# Patient Record
Sex: Female | Born: 1985 | Race: White | Hispanic: Yes | Marital: Married | Smoking: Never smoker
Health system: Southern US, Community
[De-identification: ages and names within clinical notes are randomized; demographics above are authoritative.]

## PROBLEM LIST (undated history)

## (undated) DIAGNOSIS — E039 Hypothyroidism, unspecified: Secondary | ICD-10-CM

## (undated) DIAGNOSIS — F32A Depression, unspecified: Secondary | ICD-10-CM

## (undated) DIAGNOSIS — G43909 Migraine, unspecified, not intractable, without status migrainosus: Secondary | ICD-10-CM

## (undated) DIAGNOSIS — F329 Major depressive disorder, single episode, unspecified: Secondary | ICD-10-CM

## (undated) HISTORY — DX: Migraine, unspecified, not intractable, without status migrainosus: G43.909

## (undated) HISTORY — PX: MOUTH SURGERY: SHX715

## (undated) HISTORY — DX: Hypothyroidism, unspecified: E03.9

## (undated) HISTORY — DX: Depression, unspecified: F32.A

## (undated) HISTORY — DX: Major depressive disorder, single episode, unspecified: F32.9

---

## 2007-07-11 ENCOUNTER — Emergency Department (HOSPITAL_COMMUNITY): Admission: EM | Admit: 2007-07-11 | Discharge: 2007-07-11 | Payer: Self-pay | Admitting: Emergency Medicine

## 2008-01-22 ENCOUNTER — Emergency Department (HOSPITAL_COMMUNITY): Admission: EM | Admit: 2008-01-22 | Discharge: 2008-01-23 | Payer: Self-pay | Admitting: Emergency Medicine

## 2009-04-16 ENCOUNTER — Emergency Department (HOSPITAL_COMMUNITY): Admission: EM | Admit: 2009-04-16 | Discharge: 2009-04-16 | Payer: Self-pay | Admitting: Family Medicine

## 2009-06-22 DIAGNOSIS — Z8759 Personal history of other complications of pregnancy, childbirth and the puerperium: Secondary | ICD-10-CM | POA: Insufficient documentation

## 2009-06-22 LAB — HM PAP SMEAR: HM Pap smear: NORMAL

## 2009-09-20 HISTORY — PX: OTHER SURGICAL HISTORY: SHX169

## 2009-09-28 ENCOUNTER — Emergency Department (HOSPITAL_COMMUNITY): Admission: EM | Admit: 2009-09-28 | Discharge: 2009-09-29 | Payer: Self-pay | Admitting: Emergency Medicine

## 2009-09-29 ENCOUNTER — Inpatient Hospital Stay (HOSPITAL_COMMUNITY): Admission: AD | Admit: 2009-09-29 | Discharge: 2009-09-29 | Payer: Self-pay | Admitting: Obstetrics and Gynecology

## 2009-10-01 ENCOUNTER — Inpatient Hospital Stay (HOSPITAL_COMMUNITY): Admission: AD | Admit: 2009-10-01 | Discharge: 2009-10-01 | Payer: Self-pay | Admitting: Obstetrics and Gynecology

## 2009-10-01 ENCOUNTER — Ambulatory Visit: Payer: Self-pay | Admitting: Obstetrics and Gynecology

## 2009-10-03 ENCOUNTER — Ambulatory Visit (HOSPITAL_COMMUNITY): Admission: RE | Admit: 2009-10-03 | Discharge: 2009-10-03 | Payer: Self-pay | Admitting: Obstetrics and Gynecology

## 2009-10-08 ENCOUNTER — Inpatient Hospital Stay (HOSPITAL_COMMUNITY): Admission: AD | Admit: 2009-10-08 | Discharge: 2009-10-09 | Payer: Self-pay | Admitting: Obstetrics & Gynecology

## 2009-10-11 ENCOUNTER — Ambulatory Visit (HOSPITAL_COMMUNITY): Admission: RE | Admit: 2009-10-11 | Discharge: 2009-10-11 | Payer: Self-pay | Admitting: Obstetrics & Gynecology

## 2009-10-13 ENCOUNTER — Ambulatory Visit (HOSPITAL_COMMUNITY): Admission: RE | Admit: 2009-10-13 | Discharge: 2009-10-13 | Payer: Self-pay | Admitting: Obstetrics and Gynecology

## 2009-10-16 ENCOUNTER — Inpatient Hospital Stay (HOSPITAL_COMMUNITY): Admission: AD | Admit: 2009-10-16 | Discharge: 2009-10-16 | Payer: Self-pay | Admitting: Family Medicine

## 2009-10-17 ENCOUNTER — Ambulatory Visit: Payer: Self-pay | Admitting: Obstetrics & Gynecology

## 2009-10-17 ENCOUNTER — Inpatient Hospital Stay (HOSPITAL_COMMUNITY): Admission: AD | Admit: 2009-10-17 | Discharge: 2009-10-17 | Payer: Self-pay | Admitting: Obstetrics & Gynecology

## 2009-10-19 ENCOUNTER — Ambulatory Visit (HOSPITAL_COMMUNITY): Admission: RE | Admit: 2009-10-19 | Discharge: 2009-10-19 | Payer: Self-pay | Admitting: Obstetrics & Gynecology

## 2009-10-19 ENCOUNTER — Ambulatory Visit: Payer: Self-pay | Admitting: Obstetrics & Gynecology

## 2009-10-20 DEATH — deceased

## 2009-11-20 ENCOUNTER — Ambulatory Visit: Payer: Self-pay | Admitting: Obstetrics & Gynecology

## 2010-07-13 ENCOUNTER — Encounter: Payer: Self-pay | Admitting: Obstetrics and Gynecology

## 2010-08-07 ENCOUNTER — Inpatient Hospital Stay (HOSPITAL_COMMUNITY)
Admission: AD | Admit: 2010-08-07 | Discharge: 2010-08-07 | Disposition: A | Discharge status: Home / Self Care | Payer: 59 | Source: Ambulatory Visit | Attending: Obstetrics and Gynecology | Admitting: Obstetrics and Gynecology

## 2010-08-07 DIAGNOSIS — R197 Diarrhea, unspecified: Secondary | ICD-10-CM

## 2010-08-07 DIAGNOSIS — O212 Late vomiting of pregnancy: Secondary | ICD-10-CM | POA: Insufficient documentation

## 2010-08-07 LAB — URINALYSIS, ROUTINE W REFLEX MICROSCOPIC
Protein, ur: NEGATIVE mg/dL
Urine Glucose, Fasting: 250 mg/dL — AB
Urobilinogen, UA: 1 mg/dL (ref 0.0–1.0)

## 2010-08-19 ENCOUNTER — Inpatient Hospital Stay (HOSPITAL_COMMUNITY)
Admission: AD | Admit: 2010-08-19 | Discharge: 2010-08-19 | Disposition: A | Discharge status: Home / Self Care | Payer: 59 | Source: Ambulatory Visit | Attending: Obstetrics and Gynecology | Admitting: Obstetrics and Gynecology

## 2010-08-19 DIAGNOSIS — A499 Bacterial infection, unspecified: Secondary | ICD-10-CM | POA: Insufficient documentation

## 2010-08-19 DIAGNOSIS — N760 Acute vaginitis: Secondary | ICD-10-CM

## 2010-08-19 DIAGNOSIS — B9689 Other specified bacterial agents as the cause of diseases classified elsewhere: Secondary | ICD-10-CM | POA: Insufficient documentation

## 2010-08-19 DIAGNOSIS — O2390 Unspecified genitourinary tract infection in pregnancy, unspecified trimester: Secondary | ICD-10-CM | POA: Insufficient documentation

## 2010-08-19 DIAGNOSIS — N949 Unspecified condition associated with female genital organs and menstrual cycle: Secondary | ICD-10-CM

## 2010-08-19 LAB — URINALYSIS, ROUTINE W REFLEX MICROSCOPIC
Bilirubin Urine: NEGATIVE
Ketones, ur: NEGATIVE mg/dL
Nitrite: NEGATIVE
Protein, ur: NEGATIVE mg/dL
Urobilinogen, UA: 0.2 mg/dL (ref 0.0–1.0)
pH: 6 (ref 5.0–8.0)

## 2010-08-19 LAB — WET PREP, GENITAL: Yeast Wet Prep HPF POC: NONE SEEN

## 2010-09-09 LAB — DIFFERENTIAL
Basophils Relative: 0 % (ref 0–1)
Eosinophils Absolute: 0.1 10*3/uL (ref 0.0–0.7)
Lymphocytes Relative: 25 % (ref 12–46)
Lymphs Abs: 1.7 10*3/uL (ref 0.7–4.0)
Monocytes Relative: 6 % (ref 3–12)
Neutro Abs: 4.6 10*3/uL (ref 1.7–7.7)

## 2010-09-09 LAB — HCG, QUANTITATIVE, PREGNANCY
hCG, Beta Chain, Quant, S: 1274 m[IU]/mL — ABNORMAL HIGH (ref <5)
hCG, Beta Chain, Quant, S: 1869 m[IU]/mL — ABNORMAL HIGH (ref <5)
hCG, Beta Chain, Quant, S: 1894 m[IU]/mL — ABNORMAL HIGH (ref <5)
hCG, Beta Chain, Quant, S: 592 m[IU]/mL — ABNORMAL HIGH (ref <5)

## 2010-09-09 LAB — BUN: BUN: 10 mg/dL (ref 6–23)

## 2010-09-09 LAB — CBC
HCT: 35.5 % — ABNORMAL LOW (ref 36.0–46.0)
Hemoglobin: 12 g/dL (ref 12.0–15.0)
Hemoglobin: 12.2 g/dL (ref 12.0–15.0)
MCHC: 33.9 g/dL (ref 30.0–36.0)
MCV: 91.7 fL (ref 78.0–100.0)
Platelets: 343 10*3/uL (ref 150–400)
WBC: 8.8 10*3/uL (ref 4.0–10.5)

## 2010-09-09 LAB — GC/CHLAMYDIA PROBE AMP, URINE: GC Probe Amp, Urine: NEGATIVE

## 2010-09-10 LAB — HCG, SERUM, QUALITATIVE: Preg, Serum: POSITIVE — AB

## 2010-09-10 LAB — WET PREP, GENITAL
Trich, Wet Prep: NONE SEEN
Yeast Wet Prep HPF POC: NONE SEEN

## 2010-09-10 LAB — URINALYSIS, ROUTINE W REFLEX MICROSCOPIC
Glucose, UA: NEGATIVE mg/dL
Ketones, ur: NEGATIVE mg/dL
Protein, ur: NEGATIVE mg/dL

## 2010-09-10 LAB — POCT PREGNANCY, URINE: Preg Test, Ur: NEGATIVE

## 2010-09-10 LAB — HCG, QUANTITATIVE, PREGNANCY: hCG, Beta Chain, Quant, S: 19 m[IU]/mL — ABNORMAL HIGH (ref <5)

## 2010-11-15 ENCOUNTER — Inpatient Hospital Stay (HOSPITAL_COMMUNITY)
Admission: AD | Admit: 2010-11-15 | Discharge: 2010-11-16 | Disposition: A | Discharge status: Home / Self Care | Payer: 59 | Source: Ambulatory Visit | Attending: Obstetrics and Gynecology | Admitting: Obstetrics and Gynecology

## 2010-11-15 DIAGNOSIS — R109 Unspecified abdominal pain: Secondary | ICD-10-CM | POA: Insufficient documentation

## 2010-11-15 DIAGNOSIS — O9989 Other specified diseases and conditions complicating pregnancy, childbirth and the puerperium: Secondary | ICD-10-CM

## 2010-11-15 DIAGNOSIS — O99891 Other specified diseases and conditions complicating pregnancy: Secondary | ICD-10-CM | POA: Insufficient documentation

## 2010-11-16 ENCOUNTER — Inpatient Hospital Stay (HOSPITAL_COMMUNITY)
Admission: AD | Admit: 2010-11-16 | Discharge: 2010-11-18 | DRG: 775 | Disposition: A | Discharge status: Home / Self Care | Payer: 59 | Source: Ambulatory Visit | Attending: Obstetrics and Gynecology | Admitting: Obstetrics and Gynecology

## 2010-11-16 LAB — COMPREHENSIVE METABOLIC PANEL
ALT: 10 U/L (ref 0–35)
AST: 13 U/L (ref 0–37)
Albumin: 2.1 g/dL — ABNORMAL LOW (ref 3.5–5.2)
Alkaline Phosphatase: 123 U/L — ABNORMAL HIGH (ref 39–117)
BUN: 7 mg/dL (ref 6–23)
CO2: 21 mEq/L (ref 19–32)
Calcium: 9.2 mg/dL (ref 8.4–10.5)
Chloride: 103 mEq/L (ref 96–112)
Creatinine, Ser: <0.47 mg/dL (ref 0.4–1.2)
Glucose, Bld: 84 mg/dL (ref 70–99)
Potassium: 3.7 mEq/L (ref 3.5–5.1)
Sodium: 135 mEq/L (ref 135–145)
Total Bilirubin: 0.1 mg/dL — ABNORMAL LOW (ref 0.3–1.2)
Total Protein: 5.6 g/dL — ABNORMAL LOW (ref 6.0–8.3)

## 2010-11-16 LAB — URINALYSIS, ROUTINE W REFLEX MICROSCOPIC
Bilirubin Urine: NEGATIVE
Glucose, UA: NEGATIVE mg/dL
Ketones, ur: NEGATIVE mg/dL
Nitrite: NEGATIVE
pH: 6.5 (ref 5.0–8.0)

## 2010-11-16 LAB — CBC
HCT: 33.3 % — ABNORMAL LOW (ref 36.0–46.0)
Hemoglobin: 10.7 g/dL — ABNORMAL LOW (ref 12.0–15.0)
Hemoglobin: 11.4 g/dL — ABNORMAL LOW (ref 12.0–15.0)
MCH: 28 pg (ref 26.0–34.0)
MCHC: 32.1 g/dL (ref 30.0–36.0)
MCV: 87.2 fL (ref 78.0–100.0)
Platelets: 245 10*3/uL (ref 150–400)
RBC: 3.82 MIL/uL — ABNORMAL LOW (ref 3.87–5.11)
RBC: 4.01 MIL/uL (ref 3.87–5.11)
RDW: 15 % (ref 11.5–15.5)
WBC: 15.6 10*3/uL — ABNORMAL HIGH (ref 4.0–10.5)
WBC: 9.3 10*3/uL (ref 4.0–10.5)

## 2010-11-16 LAB — RPR: RPR Ser Ql: NONREACTIVE

## 2010-11-16 LAB — URINE MICROSCOPIC-ADD ON

## 2010-11-16 LAB — URIC ACID: Uric Acid, Serum: 4.4 mg/dL (ref 2.4–7.0)

## 2010-11-17 LAB — CBC
HCT: 29.3 % — ABNORMAL LOW (ref 36.0–46.0)
Hemoglobin: 9.3 g/dL — ABNORMAL LOW (ref 12.0–15.0)
RBC: 3.34 MIL/uL — ABNORMAL LOW (ref 3.87–5.11)
WBC: 12.9 10*3/uL — ABNORMAL HIGH (ref 4.0–10.5)
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
## 2011-02-06 ENCOUNTER — Ambulatory Visit (INDEPENDENT_AMBULATORY_CARE_PROVIDER_SITE_OTHER): Payer: 59 | Admitting: Internal Medicine

## 2011-02-06 ENCOUNTER — Other Ambulatory Visit (INDEPENDENT_AMBULATORY_CARE_PROVIDER_SITE_OTHER): Payer: 59 | Admitting: Internal Medicine

## 2011-02-06 ENCOUNTER — Encounter: Payer: Self-pay | Admitting: Internal Medicine

## 2011-02-06 ENCOUNTER — Other Ambulatory Visit (INDEPENDENT_AMBULATORY_CARE_PROVIDER_SITE_OTHER): Payer: 59

## 2011-02-06 DIAGNOSIS — G43909 Migraine, unspecified, not intractable, without status migrainosus: Secondary | ICD-10-CM | POA: Insufficient documentation

## 2011-02-06 DIAGNOSIS — Z0000 Encounter for general adult medical examination without abnormal findings: Secondary | ICD-10-CM

## 2011-02-06 LAB — CBC WITH DIFFERENTIAL/PLATELET
Basophils Relative: 0.4 % (ref 0.0–3.0)
Eosinophils Relative: 0.9 % (ref 0.0–5.0)
Lymphocytes Relative: 31 % (ref 12.0–46.0)
Lymphs Abs: 1.5 10*3/uL (ref 0.7–4.0)
MCHC: 32.7 g/dL (ref 30.0–36.0)
MCV: 86.5 fl (ref 78.0–100.0)
Monocytes Relative: 7.2 % (ref 3.0–12.0)
Neutro Abs: 2.9 10*3/uL (ref 1.4–7.7)

## 2011-02-06 LAB — TSH: TSH: 0.03 u[IU]/mL — ABNORMAL LOW (ref 0.35–5.50)

## 2011-02-06 LAB — BASIC METABOLIC PANEL
BUN: 10 mg/dL (ref 6–23)
CO2: 25 mEq/L (ref 19–32)
Calcium: 9.5 mg/dL (ref 8.4–10.5)
Chloride: 106 mEq/L (ref 96–112)
Creatinine, Ser: 0.4 mg/dL (ref 0.4–1.2)

## 2011-02-06 LAB — URINALYSIS, ROUTINE W REFLEX MICROSCOPIC
Bilirubin Urine: NEGATIVE
Ketones, ur: NEGATIVE
Specific Gravity, Urine: >=1.03 (ref 1.000–1.030)
Total Protein, Urine: NEGATIVE
Urine Glucose: NEGATIVE
pH: 6 (ref 5.0–8.0)

## 2011-02-06 LAB — T4, FREE: Free T4: 1.73 ng/dL — ABNORMAL HIGH (ref 0.60–1.60)

## 2011-02-06 LAB — HEPATIC FUNCTION PANEL
AST: 23 U/L (ref 0–37)
Alkaline Phosphatase: 52 U/L (ref 39–117)
Bilirubin, Direct: 0.1 mg/dL (ref 0.0–0.3)
Total Protein: 7.8 g/dL (ref 6.0–8.3)

## 2011-02-06 LAB — LIPID PANEL
Total CHOL/HDL Ratio: 4
VLDL: 50.4 mg/dL — ABNORMAL HIGH (ref 0.0–40.0)

## 2011-02-06 MED ORDER — SUMATRIPTAN SUCCINATE 50 MG PO TABS: 50.0000 mg | ORAL_TABLET | Freq: Once | ORAL | Status: DC | PRN

## 2011-02-06 MED ORDER — BUTALBITAL-APAP-CAFFEINE 50-325-40 MG PO TABS: 1.0000 | ORAL_TABLET | Freq: Two times a day (BID) | ORAL | Status: DC | PRN

## 2011-02-06 NOTE — Assessment & Plan Note (Signed)
Suspect hormone related given hx age 24-21 yo symptoms and recurrence with pregnancy/postpartum Prior eval/imagine unremarkable per pt - will delay repeat imaging at this time as no red flags on hx or exam - tx prn symptoms of tension + migraine with meds as discussed Consider elavil or sertraline prophylaxis if continued daily symptoms despite prns Also pt to work with obgyn on hormones (still bleeding after 30d on new OCP) Pt understands and agrees to same - f/u 6-8 wks for review, sooner if problems

## 2011-02-06 NOTE — Patient Instructions (Signed)
It was good to see you today. We have reviewed your prior medical history including labs and tests today Test(s) ordered today. Your results will be called to you after review (48-72hours after test completion). If any changes need to be made, you will be notified at that time. Continue to work with your gynecologist on bleeding issues/hormones as discussed Use Fiorcet (Esgic) as needed for headache pains and Imitrex as needed for migaine symptoms (nausea + pain) - Your prescription(s) have been submitted to your pharmacy. Please take as directed and contact our office if you believe you are having problem(s) with the medication(s). Please schedule followup in 6-8 weeks to review headache symptoms/meds, call sooner if problems.

## 2011-02-06 NOTE — Progress Notes (Signed)
Subjective:    Patient ID: Cheryl Hampton, female    DOB: Apr 04, 1986, 25 y.o.   MRN: [id number]  HPI New patient to me and our practice, here to establish care - patient is here today for annual physical. Patient feels well today.  Also complains of headaches -  Dx migraines age 44y - frequently missed school due to same - but then symptoms improved without other tx after 5 years eval with MRI and LP then unremarkable Now, onset 3 months ago with recurrent symptoms after birth of 1st child ?precipitated by pregnancy or postpartum nuvaring -> changed to OCP 1 month ago without improvement in headache headache pattern is daily - starts at crown and wraps forward to frontal area bilaterally associated with nausea, no vomiting, vision change, fever or weakness Increasing frequency (all day, every day while awake) and increasing severity of pain/duration Temporary improvement in symptoms with vicodin and antiemetics; no improvement with OTC meds  Past Medical History  Diagnosis Date  . Depression   . Migraine headache    Family History  Problem Relation Age of Onset  . Alcohol abuse Maternal Uncle   . Pancreatic cancer Maternal Grandmother   . Stroke Other     grandparent  . Hypertension Other     parent., grandparent  . Diabetes Other     parent, grandparent   History  Substance Use Topics  . Smoking status: Never Smoker   . Smokeless tobacco: Not on file  . 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  HENT: Negative for congestion and sinus pressure.   Respiratory: Negative for shortness of breath.   Cardiovascular: Negative for chest pain.  Neurological: Negative for seizures and weakness.  Gastrointestinal: Negative for abdominal pain.  Musculoskeletal: Negative for gait problem.  Skin: Negative for rash.  No other specific complaints in a complete review of systems (except as  listed in HPI above).      Objective:   Physical Exam BP 128/80  Pulse 74  Temp(Src) 98.4 F (36.9 C) (Oral)  Ht 5\' 4"  (1.626 m)  Wt 217 lb (98.431 kg)  BMI 37.25 kg/m2  SpO2 98% Constitutional: She is overweight; She appears well-developed and well-nourished. No distress. infant dtr at side HENT: Head: Normocephalic and atraumatic. Ears: B TMs ok, no erythema or effusion; Nose: Nose normal.  Mouth/Throat: Oropharynx is clear and moist. No oropharyngeal exudate.  Eyes: Conjunctivae and EOM are normal. Pupils are equal, round, and reactive to light. No scleral icterus.  Neck: Normal range of motion. Neck supple. No JVD present. No thyromegaly, nodules or tenderness present.  Cardiovascular: Normal rate, regular rhythm and normal heart sounds.  No murmur heard. No BLE edema. Pulmonary/Chest: Effort normal and breath sounds normal. No respiratory distress. She has no wheezes.  Abdominal: Soft. Bowel sounds are normal. She exhibits no distension. There is no tenderness. no masses Musculoskeletal: Normal range of motion, no joint effusions. No gross deformities Neurological: She is alert and oriented to person, place, and time. No cranial nerve deficit. Coordination normal; speech and cognition normal. MAE well, equal grip strength and good fine motor, gait/balance normal; negative romberg, normal F>N Skin: Skin is warm and dry. No rash noted. No erythema.  Psychiatric: She has a normal mood and affect. Her behavior is normal. Judgment and thought content normal.   Lab Results  Component Value Date   WBC 12.9* 11/17/2010  HGB 9.3 DELTA CHECK NOTED REPEATED TO VERIFY* 11/17/2010   HCT 29.3* 11/17/2010   PLT 243 11/17/2010   ALT 10 11/16/2010   AST 13 11/16/2010   NA 135 11/16/2010   K 3.7 11/16/2010   CL 103 11/16/2010   CREATININE <0.47 REPEATED TO VERIFY 11/16/2010   BUN 7 11/16/2010   CO2 21 11/16/2010       Assessment & Plan:  CPX - v70.0 - Patient has been counseled on  age-appropriate routine health concerns for screening and prevention. These are reviewed and up-to-date. Immunizations are up-to-date or declined. Labs ordered and will be reviewed.

## 2011-02-09 ENCOUNTER — Other Ambulatory Visit: Payer: Self-pay | Admitting: Internal Medicine

## 2011-02-09 DIAGNOSIS — E0590 Thyrotoxicosis, unspecified without thyrotoxic crisis or storm: Secondary | ICD-10-CM

## 2011-02-17 ENCOUNTER — Ambulatory Visit: Payer: 59 | Admitting: Endocrinology

## 2011-02-26 ENCOUNTER — Ambulatory Visit: Payer: 59 | Admitting: Endocrinology

## 2011-02-26 DIAGNOSIS — Z0289 Encounter for other administrative examinations: Secondary | ICD-10-CM

## 2011-03-20 LAB — POCT I-STAT, CHEM 8
BUN: 7
Calcium, Ion: 1.16
Chloride: 107
Creatinine, Ser: 0.8
Glucose, Bld: 100 — ABNORMAL HIGH
HCT: 43
Hemoglobin: 14.6
Potassium: 3.8
Sodium: 139
TCO2: 23

## 2011-03-20 LAB — URINALYSIS, ROUTINE W REFLEX MICROSCOPIC
Glucose, UA: NEGATIVE
Hgb urine dipstick: NEGATIVE
Ketones, ur: NEGATIVE
Protein, ur: NEGATIVE

## 2011-03-20 LAB — DIFFERENTIAL
Basophils Relative: 0
Eosinophils Absolute: 0
Eosinophils Relative: 0
Lymphs Abs: 1.2
Monocytes Relative: 12

## 2011-03-20 LAB — CBC
HCT: 41.3
MCHC: 33.8
MCV: 89
Platelets: 274
RBC: 4.65
WBC: 3.2 — ABNORMAL LOW

## 2011-03-20 LAB — POCT CARDIAC MARKERS: Troponin i, poc: <0.05

## 2011-03-23 ENCOUNTER — Encounter: Payer: Self-pay | Admitting: Internal Medicine

## 2011-03-23 ENCOUNTER — Ambulatory Visit (INDEPENDENT_AMBULATORY_CARE_PROVIDER_SITE_OTHER): Payer: 59 | Admitting: Internal Medicine

## 2011-03-23 DIAGNOSIS — E0590 Thyrotoxicosis, unspecified without thyrotoxic crisis or storm: Secondary | ICD-10-CM

## 2011-03-23 DIAGNOSIS — F329 Major depressive disorder, single episode, unspecified: Secondary | ICD-10-CM

## 2011-03-23 MED ORDER — SERTRALINE HCL 25 MG PO TABS: 25.0000 mg | ORAL_TABLET | Freq: Every day | ORAL | Status: DC

## 2011-03-23 NOTE — Assessment & Plan Note (Signed)
Hx same as teenager, exac postpartum 10/2010 Planning behav health counseling but would like to start med due to irritability noted by family and coworkers - Sertraline low dose - erx done - to follow up 6-8 weeks

## 2011-03-23 NOTE — Assessment & Plan Note (Signed)
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

## 2011-03-23 NOTE — Progress Notes (Signed)
  Subjective:    Patient ID: Cheryl Hampton, female    DOB: 09-23-1985, 25 y.o.   MRN: [id number]  HPI  Here follow up headaches -   Dx migraines age 28y - frequently missed school due to same - but then symptoms improved without other tx after 5 years eval with MRI and LP then unremarkable Now, onset 3 months ago with recurrent symptoms after birth of 1st child ?precipitated by pregnancy or postpartum nuvaring -> changed to [facility] 12/2010 without improvement in headache headache pattern is daily - starts at crown and wraps forward to frontal area bilaterally associated with nausea, no vomiting, vision change, fever or weakness Increasing frequency (all day, every day while awake) and increasing severity of pain/duration Temporary improvement in symptoms with vicodin and antiemetics; no improvement with OTC meds Started Esgic prn 01/2011 - much improved  Depression - increase symptoms since birth of dtr 10/2010 - noted irritability by family and coworkers Has counseling apt sched - would like to start med Never on med tx for same  hyperthryoid - incidental dx 01/2011 cpx labs - Saw endo - 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 Depression   . Migraine headache     History  Substance Use Topics  . Smoking status: Never Smoker   . Smokeless tobacco: Not on file  . 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  HENT: Negative for congestion and sinus pressure.   Respiratory: Negative for shortness of breath.   Cardiovascular: Negative for chest pain.  Neurological: Negative for seizures and weakness.      Objective:   Physical Exam  BP 110/68  Pulse 88  Temp(Src) 98.3 F (36.8 C) (Oral)  Ht 5\' 2"  (1.575 m)  Wt 217 lb 12.8  oz (98.793 kg)  BMI 39.84 kg/m2  SpO2 97% Constitutional: She is overweight; She appears well-developed and well-nourished. No distress. Neck: Normal range of motion. Neck supple. No JVD present. No thyromegaly, nodules or tenderness present.  Cardiovascular: Normal rate, regular rhythm and normal heart sounds.  No murmur heard. No BLE edema. Pulmonary/Chest: Effort normal and breath sounds normal. No respiratory distress. She has no wheezes. Skin: Skin is warm and dry. No rash noted. No erythema.  Psychiatric: She has a normal mood and affect. Her behavior is normal. Judgment and thought content normal.   Lab Results  Component Value Date   WBC 4.7 02/06/2011   HGB 12.1 02/06/2011   HCT 37.0 02/06/2011   PLT 334.0 02/06/2011   CHOL 179 02/06/2011   TRIG 252.0* 02/06/2011   HDL 40.00 02/06/2011   LDLDIRECT 122.7 02/06/2011   ALT 30 02/06/2011   AST 23 02/06/2011   NA 139 02/06/2011   K 3.7 02/06/2011   CL 106 02/06/2011   CREATININE 0.4 02/06/2011   BUN 10 02/06/2011   CO2 25 02/06/2011   TSH 0.03* 02/06/2011       Assessment & Plan:  See problem list. Medications and labs reviewed today.

## 2011-03-23 NOTE — Patient Instructions (Signed)
It was good to see you today. Thyroid test(s) ordered today for next week. Your results will be called to you after review (48-72hours after test completion). If any changes need to be made, you will be notified at that time. Start low dose sertraline for your depression symptoms and go to Dr. Dellia Cloud as planned for counseling - Your prescription(s) have been submitted to your pharmacy. Please take as directed and contact our office if you believe you are having problem(s) with the medication(s). Please schedule followup in 6-8 weeks to review depression and thryoid symptoms/meds, call sooner if problems.

## 2011-03-30 ENCOUNTER — Other Ambulatory Visit (INDEPENDENT_AMBULATORY_CARE_PROVIDER_SITE_OTHER): Payer: 59

## 2011-03-30 ENCOUNTER — Other Ambulatory Visit: Payer: Self-pay | Admitting: Internal Medicine

## 2011-03-30 DIAGNOSIS — E039 Hypothyroidism, unspecified: Secondary | ICD-10-CM

## 2011-03-30 DIAGNOSIS — E0590 Thyrotoxicosis, unspecified without thyrotoxic crisis or storm: Secondary | ICD-10-CM

## 2011-03-30 LAB — T4, FREE: Free T4: 0.5 ng/dL — ABNORMAL LOW (ref 0.60–1.60)

## 2011-03-30 MED ORDER — LEVOTHYROXINE SODIUM 50 MCG PO TABS: 50.0000 ug | ORAL_TABLET | Freq: Every day | ORAL | Status: DC

## 2011-03-31 ENCOUNTER — Telehealth: Payer: Self-pay | Admitting: *Deleted

## 2011-03-31 ENCOUNTER — Ambulatory Visit (INDEPENDENT_AMBULATORY_CARE_PROVIDER_SITE_OTHER): Payer: 59 | Admitting: Psychology

## 2011-03-31 DIAGNOSIS — F331 Major depressive disorder, recurrent, moderate: Secondary | ICD-10-CM

## 2011-03-31 NOTE — Telephone Encounter (Signed)
Called pt concerning labs results. MD has ordered synthroid pt is wanting med to go to [facility]. Resending to pharmacy....03/31/11@8 :23am/LMB

## 2011-04-14 ENCOUNTER — Ambulatory Visit: Payer: 59 | Admitting: Internal Medicine

## 2011-04-23 ENCOUNTER — Ambulatory Visit: Payer: Self-pay | Admitting: Psychology

## 2011-05-06 ENCOUNTER — Encounter: Payer: Self-pay | Admitting: Internal Medicine

## 2011-05-06 ENCOUNTER — Ambulatory Visit (INDEPENDENT_AMBULATORY_CARE_PROVIDER_SITE_OTHER): Payer: 59 | Admitting: Internal Medicine

## 2011-05-06 DIAGNOSIS — E039 Hypothyroidism, unspecified: Secondary | ICD-10-CM

## 2011-05-06 DIAGNOSIS — F3289 Other specified depressive episodes: Secondary | ICD-10-CM

## 2011-05-06 DIAGNOSIS — F329 Major depressive disorder, single episode, unspecified: Secondary | ICD-10-CM

## 2011-05-06 MED ORDER — ALPRAZOLAM 0.25 MG PO TABS: 0.2500 mg | ORAL_TABLET | Freq: Three times a day (TID) | ORAL | Status: DC | PRN

## 2011-05-06 NOTE — Patient Instructions (Signed)
It was good to see you today. Start alprazolam for occasssional anxiety symptoms and continue low dose sertraline for your depression symptoms, no dose changes Your prescription(s) have been submitted to your pharmacy. Please take as directed and contact our office if you believe you are having problem(s) with the medication(s). Please schedule followup in 3 months to review depression and thyroid symptoms/meds, call sooner if problems.

## 2011-05-06 NOTE — Assessment & Plan Note (Signed)
Hx same as teenager, exac postpartum 10/2010 Ongoing behav health counseling - started sertraline 03/2011 due to irritability noted by family and coworkers - symptoms much improved but occassional anxiety Will provide low dose xanax as needed -  Other continue same, to call if symptoms worse or unimporved

## 2011-05-06 NOTE — Assessment & Plan Note (Signed)
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

## 2011-05-06 NOTE — Progress Notes (Signed)
  Subjective:    Patient ID: Cheryl Hampton, female    DOB: Oct 11, 1985, 25 y.o.   MRN: [id number]  HPI  Here follow up   Migraine headache hx - Dx migraines age 50y - frequently missed school due to same - but then symptoms improved without other tx after 5 years eval with MRI and LP then unremarkable Now, onset 3 months ago with recurrent symptoms after birth of 1st child ?precipitated by pregnancy or postpartum nuvaring -> changed to [facility] 12/2010 without improvement in headache headache pattern is daily - starts at crown and wraps forward to frontal area bilaterally associated with nausea, no vomiting, vision change, fever or weakness Increasing frequency (all day, every day while awake) and increasing severity of pain/duration Temporary improvement in symptoms with vicodin and antiemetics; no improvement with OTC meds Started Esgic prn 01/2011 - much improved  Depression - increase symptoms since birth of dtr 10/2010 - noted irritability by family and coworkers Has counseling apt sched - started sertraline 03/2011 symptoms improved - less tearful, occasional anxiety attack - 2 events in last 6 weeks  Hypothyroid - initially hyperthryoid - incidental dx 01/2011 cpx labs - Saw endo - 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 Depression   . Migraine headache   . Hypothyroid     post partum hyperthyroid     Review of Systems  Constitutional: Negative for fever.  HENT: Negative for congestion and sinus pressure.   Respiratory: Negative for shortness of breath.   Cardiovascular: Negative for chest pain.  Neurological: Negative for seizures and weakness.      Objective:   Physical Exam  BP 110/78  Pulse 81  Temp(Src) 98.6 F (37 C) (Oral)  Wt 213 lb 6.4 oz (96.798 kg)  SpO2 97% Wt  Readings from Last 3 Encounters:  05/06/11 213 lb 6.4 oz (96.798 kg)  03/23/11 217 lb 12.8 oz (98.793 kg)  02/06/11 217 lb (98.431 kg)   Constitutional: She is overweight; She appears well-developed and well-nourished. No distress. infant daughter present Cardiovascular: Normal rate, regular rhythm and normal heart sounds.  No murmur heard. No BLE edema. Pulmonary/Chest: Effort normal and breath sounds normal. No respiratory distress. She has no wheezes. Psychiatric: She has a normal mood and affect. Her behavior is normal. Judgment and thought content normal.   Lab Results  Component Value Date   WBC 4.7 02/06/2011   HGB 12.1 02/06/2011   HCT 37.0 02/06/2011   PLT 334.0 02/06/2011   CHOL 179 02/06/2011   TRIG 252.0* 02/06/2011   HDL 40.00 02/06/2011   LDLDIRECT 122.7 02/06/2011   ALT 30 02/06/2011   AST 23 02/06/2011   NA 139 02/06/2011   K 3.7 02/06/2011   CL 106 02/06/2011   CREATININE 0.4 02/06/2011   BUN 10 02/06/2011   CO2 25 02/06/2011   TSH 9.85* 03/30/2011       Assessment & Plan:  See problem list. Medications and labs reviewed today.

## 2011-05-08 ENCOUNTER — Ambulatory Visit: Payer: 59 | Admitting: Internal Medicine

## 2011-05-20 ENCOUNTER — Other Ambulatory Visit: Payer: Self-pay | Admitting: *Deleted

## 2011-05-20 DIAGNOSIS — F329 Major depressive disorder, single episode, unspecified: Secondary | ICD-10-CM

## 2011-05-20 MED ORDER — SERTRALINE HCL 25 MG PO TABS: 25.0000 mg | ORAL_TABLET | Freq: Every day | ORAL | Status: DC

## 2011-05-20 NOTE — Telephone Encounter (Signed)
Due to insurance pt is requesting a 90 day supply on her sertraline 25mg ....05/20/11@2 :33pm/LMB

## 2011-05-25 ENCOUNTER — Ambulatory Visit: Payer: 59 | Admitting: Internal Medicine

## 2011-06-10 ENCOUNTER — Other Ambulatory Visit: Payer: Self-pay | Admitting: *Deleted

## 2011-06-10 MED ORDER — FIRST-DUKES MOUTHWASH MT SUSP: OROMUCOSAL | Status: DC

## 2011-06-10 MED ORDER — AZITHROMYCIN 250 MG PO TABS: ORAL_TABLET | ORAL | Status: AC

## 2011-07-27 ENCOUNTER — Encounter: Payer: Self-pay | Admitting: Internal Medicine

## 2011-07-27 ENCOUNTER — Other Ambulatory Visit (INDEPENDENT_AMBULATORY_CARE_PROVIDER_SITE_OTHER): Payer: 59

## 2011-07-27 ENCOUNTER — Ambulatory Visit (INDEPENDENT_AMBULATORY_CARE_PROVIDER_SITE_OTHER): Payer: 59 | Admitting: Internal Medicine

## 2011-07-27 DIAGNOSIS — E039 Hypothyroidism, unspecified: Secondary | ICD-10-CM

## 2011-07-27 DIAGNOSIS — F3289 Other specified depressive episodes: Secondary | ICD-10-CM

## 2011-07-27 DIAGNOSIS — F329 Major depressive disorder, single episode, unspecified: Secondary | ICD-10-CM

## 2011-07-27 LAB — TSH: TSH: 2.24 u[IU]/mL (ref 0.35–5.50)

## 2011-07-27 MED ORDER — ONDANSETRON HCL 4 MG PO TABS: 4.0000 mg | ORAL_TABLET | Freq: Three times a day (TID) | ORAL | Status: DC | PRN

## 2011-07-27 MED ORDER — BUPROPION HCL ER (XL) 150 MG PO TB24: 150.0000 mg | ORAL_TABLET | Freq: Every day | ORAL | Status: DC

## 2011-07-27 NOTE — Assessment & Plan Note (Signed)
Hx same as teenager, exac postpartum 10/2010 Ongoing behav health counseling - started sertraline 03/2011 due to irritability noted by family and coworkers - symptoms initially improved but worse over holiday and winter months - occassional anxiety - uses low dose xanax as needed -  Wean off sertraline and start wellbutrin now

## 2011-07-27 NOTE — Patient Instructions (Signed)
It was good to see you today. Test(s) ordered today. Your results will be called to you after review (48-72hours after test completion). If any changes need to be made, you will be notified at that time. Stop sertraline and start Wellbutrin as discussed for depression symptoms -ok to continue alprazolam for occasssional anxiety symptoms  Zofran as requested for nausea symptoms with motion sickness Your prescription(s) have been submitted to your pharmacy. Please take as directed and contact our office if you believe you are having problem(s) with the medication(s). Please schedule followup in 3 months to review depression and thyroid symptoms/meds, call sooner if problems.

## 2011-07-27 NOTE — Progress Notes (Signed)
  Subjective:    Patient ID: Cheryl Hampton, female    DOB: [birth year], 26 y.o.   MRN: [id number]  HPI  Here follow up   Migraine headache hx - Dx migraines age 65y -eval with MRI and LP then unremarkable recurrent symptoms after birth of 1st child ?precipitated by pregnancy or postpartum nuvaring -> changed to [facility] 12/2010 without improvement in headache Temporary improvement in symptoms with vicodin and antiemetics; no improvement with OTC meds Started Esgic prn 01/2011 - much improved  Depression - increase symptoms since birth of dtr 10/2010 - noted irritability by family and coworkers Has counseling apt sched - started sertraline 03/2011 Symptoms initially  improved - occasional anxiety attack over holidays ?change med  Hypothyroid - initially hyperthryoid - incidental dx 01/2011 cpx labs - Saw endo - 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 Depression   . Migraine headache   . Hypothyroid 2012 dx    post partum hyperthyroid     Review of Systems  Constitutional: Negative for fever.  HENT: Negative for congestion and sinus pressure.   Respiratory: Negative for shortness of breath.   Cardiovascular: Negative for chest pain.  Neurological: Negative for seizures and weakness.      Objective:   Physical Exam  BP 110/72  Pulse 73  Temp(Src) 98.7 F (37.1 C) (Oral)  Wt 202 lb 1.9 oz (91.681 kg)  SpO2 99% Wt Readings from Last 3 Encounters:  07/27/11 202 lb 1.9 oz (91.681 kg)  05/06/11 213 lb 6.4 oz (96.798 kg)  03/23/11 217 lb 12.8 oz (98.793 kg)   Constitutional: She is overweight; She appears well-developed and well-nourished. No distress. infant daughter present Cardiovascular: Normal rate, regular rhythm and normal heart sounds.  No murmur heard. No BLE edema. Pulmonary/Chest:  Effort normal and breath sounds normal. No respiratory distress. She has no wheezes. Psychiatric: She has a normal mood and affect. Her behavior is normal. Judgment and thought content normal.   Lab Results  Component Value Date   WBC 4.7 02/06/2011   HGB 12.1 02/06/2011   HCT 37.0 02/06/2011   PLT 334.0 02/06/2011   CHOL 179 02/06/2011   TRIG 252.0* 02/06/2011   HDL 40.00 02/06/2011   LDLDIRECT 122.7 02/06/2011   ALT 30 02/06/2011   AST 23 02/06/2011   NA 139 02/06/2011   K 3.7 02/06/2011   CL 106 02/06/2011   CREATININE 0.4 02/06/2011   BUN 10 02/06/2011   CO2 25 02/06/2011   TSH 9.85* 03/30/2011       Assessment & Plan:  See problem list. Medications and labs reviewed today.

## 2011-07-27 NOTE — Assessment & Plan Note (Signed)
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   

## 2011-08-05 ENCOUNTER — Telehealth: Payer: Self-pay

## 2011-08-05 NOTE — Telephone Encounter (Signed)
Pt called stating she has been experiencing dizziness that last all day. Pt is concerned that this may be related to her thyroid, please advise.

## 2011-08-05 NOTE — Telephone Encounter (Signed)
Lab Results  Component Value Date   TSH 2.24 07/27/2011   As recent labs are normal, doubt relation to thyroid. Patient should hydrate, try meclizine 12.5mg  3 times a day prn dizziness (may send e-rx if needed) and schedule office visit if persisting symptoms or worse - thanks

## 2011-08-06 MED ORDER — MECLIZINE HCL 12.5 MG PO TABS: 12.5000 mg | ORAL_TABLET | Freq: Three times a day (TID) | ORAL | Status: AC | PRN

## 2011-08-06 NOTE — Telephone Encounter (Signed)
Pt advised and will try medication and call back PRN 

## 2011-08-10 ENCOUNTER — Other Ambulatory Visit: Payer: Self-pay

## 2011-08-10 MED ORDER — BUTALBITAL-APAP-CAFFEINE 50-325-40 MG PO TABS: 1.0000 | ORAL_TABLET | Freq: Two times a day (BID) | ORAL | Status: DC | PRN

## 2011-09-08 ENCOUNTER — Other Ambulatory Visit: Payer: Self-pay | Admitting: Internal Medicine

## 2011-09-08 NOTE — Telephone Encounter (Signed)
Faxed script back to [place] @ [phone]... 09/08/11@10 :35am/LMB

## 2011-09-09 ENCOUNTER — Telehealth: Payer: Self-pay | Admitting: *Deleted

## 2011-09-09 MED ORDER — ALPRAZOLAM 0.25 MG PO TABS: 0.2500 mg | ORAL_TABLET | Freq: Three times a day (TID) | ORAL | Status: DC | PRN

## 2011-09-09 NOTE — Telephone Encounter (Signed)
Ok thanks 

## 2011-09-09 NOTE — Telephone Encounter (Signed)
Faxed script back to [place]... 09/09/11@1 :02pm/LMB

## 2011-09-09 NOTE — Telephone Encounter (Signed)
Pt is requesting a 90 day supply on her alprazolam 0.25mg . Need rx sent to [place] [facility]... 09/09/11@10L :26am/LMB

## 2011-10-14 ENCOUNTER — Other Ambulatory Visit: Payer: Self-pay | Admitting: *Deleted

## 2011-10-14 MED ORDER — ALPRAZOLAM 0.25 MG PO TABS: 0.2500 mg | ORAL_TABLET | Freq: Three times a day (TID) | ORAL | Status: DC | PRN

## 2011-10-14 NOTE — Telephone Encounter (Signed)
Faxed script back to [facility]... 10/14/11@2 :30pm/LMB

## 2011-10-29 ENCOUNTER — Encounter: Payer: Self-pay | Admitting: Internal Medicine

## 2011-10-29 ENCOUNTER — Ambulatory Visit (INDEPENDENT_AMBULATORY_CARE_PROVIDER_SITE_OTHER): Payer: 59 | Admitting: Internal Medicine

## 2011-10-29 ENCOUNTER — Other Ambulatory Visit (INDEPENDENT_AMBULATORY_CARE_PROVIDER_SITE_OTHER): Payer: 59

## 2011-10-29 VITALS — BP 120/80 | HR 78 | Temp 98.4°F | Ht 64.0 in | Wt 187.1 lb

## 2011-10-29 DIAGNOSIS — M545 Low back pain: Secondary | ICD-10-CM

## 2011-10-29 DIAGNOSIS — E039 Hypothyroidism, unspecified: Secondary | ICD-10-CM

## 2011-10-29 DIAGNOSIS — F329 Major depressive disorder, single episode, unspecified: Secondary | ICD-10-CM

## 2011-10-29 LAB — TSH: TSH: 0.75 u[IU]/mL (ref 0.35–5.50)

## 2011-10-29 NOTE — Assessment & Plan Note (Signed)
Hx same as teenager, exac postpartum 10/2010 Ongoing behav health counseling - started sertraline 03/2011 due to irritability noted by family and coworkers - symptoms initially improved but worse over holiday and winter months - occassional anxiety - uses low dose xanax as needed -  Changed to wellbutrin 07/2011 and separated from spouse 08/2011 - much improved The current medical regimen is effective;  continue present plan and medications.

## 2011-10-29 NOTE — Progress Notes (Signed)
  Subjective:    Patient ID: Cheryl Hampton, female    DOB: [birth year], 26 y.o.   MRN: [id number]  HPI  Here follow up - reviewed chronic medical issues:  Migraine headache - Dx migraines age 31y -eval with MRI and LP then unremarkable recurrent symptoms after birth of 1st child ?precipitated by pregnancy or postpartum nuvaring > changed to [facility] 12/2010 without improvement Temporary improvement in symptoms with vicodin and antiemetics; no improvement with OTC meds Started Esgic prn 01/2011 - much improved  Depression - increase symptoms since birth of dtr 10/2010 -  Manifest with irritability noted by family and coworkers started sertraline 03/2011, Symptoms initially improved -  Then changed to wellbutrin 07/2011 and prn xanax Also separated 08/2011 - largely better situation  Hypothyroid - initially hyperthryoid - incidental dx 01/2011 cpx labs - Saw endo - 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 Depression   . Migraine headache   . 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  HENT: Negative for congestion and sinus pressure.   Respiratory: Negative for shortness of breath.   Cardiovascular: Negative for chest pain.  Musculoskeletal: Positive for back pain (occasional).  Neurological: Negative for seizures and weakness.      Objective:   Physical Exam  BP 120/80  Pulse 78  Temp(Src) 98.4 F (36.9 C) (Oral)  Ht 5\' 4"  (1.626 m)  Wt 187 lb 1.9 oz (84.877 kg)  BMI 32.12 kg/m2  SpO2 97% Wt Readings from Last 3 Encounters:  10/29/11 187 lb 1.9 oz (84.877 kg)  07/27/11 202 lb 1.9 oz (91.681 kg)  05/06/11 213 lb 6.4 oz (96.798 kg)   Constitutional: She is overweight; She appears well-developed and well-nourished. No  distress. Neck: no palpable goiter Cardiovascular: Normal rate, regular rhythm and normal heart sounds.  No murmur heard. No BLE edema. Pulmonary/Chest: Effort normal and breath sounds normal. No respiratory distress. She has no wheezes. MSkel: Back: full range of motion of thoracic and lumbar spine. Non tender to palpation. Negative straight leg raise. DTR's are symmetrically intact. Sensation intact in all dermatomes of the lower extremities. Full strength to manual muscle testing. patient is able to heel toe walk without difficulty and ambulates with non-antalgic gait. Psychiatric: She has a normal mood and affect. Her behavior is normal. Judgment and thought content normal.   Lab Results  Component Value Date   WBC 4.7 02/06/2011   HGB 12.1 02/06/2011   HCT 37.0 02/06/2011   PLT 334.0 02/06/2011   CHOL 179 02/06/2011   TRIG 252.0* 02/06/2011   HDL 40.00 02/06/2011   LDLDIRECT 122.7 02/06/2011   ALT 30 02/06/2011   AST 23 02/06/2011   NA 139 02/06/2011   K 3.7 02/06/2011   CL 106 02/06/2011   CREATININE 0.4 02/06/2011   BUN 10 02/06/2011   CO2 25 02/06/2011   TSH 2.24 07/27/2011       Assessment & Plan:  See problem list. Medications and labs reviewed today.  Lumbago - MSkel and neuro exam benign - suspect strain - advised back exercises and OTC NSAIDs prn- to call if worse or unimproved

## 2011-10-29 NOTE — Patient Instructions (Signed)
It was good to see you today. Test(s) ordered today. Your results will be called to you after review (48-72hours after test completion). If any changes need to be made, you will be notified at that time. continue Wellbutrin as discussed for depression symptoms -ok to continue alprazolam for occasssional anxiety symptoms  Back exercises as discussed - se OTC NSAIDs like ibuprofen as needed and call if worse or unimproved Please schedule followup in 4 months for CPX/labs and to review depression and thyroid; call sooner if problems. Back Exercises Back exercises help treat and prevent back injuries. The goal is to increase your strength in your belly (abdominal) and back muscles. These exercises can also help with flexibility. Start these exercises when told by your doctor. HOME CARE Back exercises include: Pelvic Tilt.  Lie on your back with your knees bent. Tilt your pelvis until the lower part of your back is against the floor. Hold this position 5 to 10 sec. Repeat this exercise 5 to 10 times.  Knee to Chest.  Pull 1 knee up against your chest and hold for 20 to 30 seconds. Repeat this with the other knee. This may be done with the other leg straight or bent, whichever feels better. Then, pull both knees up against your chest.  Sit-Ups or Curl-Ups.  Bend your knees 90 degrees. Start with tilting your pelvis, and do a partial, slow sit-up. Only lift your upper half 30 to 45 degrees off the floor. Take at least 2 to 3 seonds for each sit-up. Do not do sit-ups with your knees out straight. If partial sit-ups are difficult, simply do the above but with only tightening your belly (abdominal) muscles and holding it as told.  Hip-Lift.  Lie on your back with your knees flexed 90 degrees. Push down with your feet and shoulders as you raise your hips 2 inches off the floor. Hold for 10 seconds, repeat 5 to 10 times.  Back Arches.  Lie on your stomach. Prop yourself up on bent elbows. Slowly press on  your hands, causing an arch in your low back. Repeat 3 to 5 times.  Shoulder-Lifts.  Lie face down with arms beside your body. Keep hips and belly pressed to floor as you slowly lift your head and shoulders off the floor.  Do not overdo your exercises. Be careful in the beginning. Exercises may cause you some mild back discomfort. If the pain lasts for more than 15 minutes, stop the exercises until you see your doctor. Improvement with exercise for back problems is slow.   Document Released: 07/11/2010 Document Revised: 05/28/2011 Document Reviewed: 07/11/2010 [facility] Patient Information 2012 [place], [place].

## 2011-10-29 NOTE — Assessment & Plan Note (Signed)
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

## 2011-10-30 NOTE — Progress Notes (Signed)
Pt informed

## 2011-11-11 ENCOUNTER — Telehealth: Payer: Self-pay

## 2011-11-11 NOTE — Telephone Encounter (Signed)
Noted. thx 

## 2011-11-11 NOTE — Telephone Encounter (Signed)
[facility] Triage Call Report Triage Record Num: [id number] Operator: Geanie Berlin Patient Name: Cheryl Hampton Call Date & Time: 11/10/2011 2:40:50PM Patient Phone: [phone] PCP: Rene Paci Patient Gender: Female PCP Fax : [phone] Patient DOB: 02/14/1986 Practice Name: [facility] Reason for Call: Caller: Amybeth/Patient; PCP: Rene Paci; CB#: [phone]; Call regarding "Sinus Infection" reqeusting medication to treat; Reports nasal congestion, post nasal drip, headache, teeth and jaw pain, sinus pressure, bilateral ear pressure, and mild sore throat. Onset: 11/09/11. Afebrile. LMP 11/06/11. Emplanon. Advised to see MD now d/t office is open for moderate to severe headache unrelieved by nonprescription meds per URI Guideline. Declined appt d/t too busy at work; will have MD or NP at work look at her. Protocol(s) Used: Upper Respiratory Infection (URI) Recommended Outcome per Protocol: Call Provider Immediately Override Outcome if Used in Protocol: See Provider Immediately RN Reason for Override Outcome: Office Is Open. Reason for Outcome: Moderate to severe headache unrelieved by nonprescription medication Care Advice: ~ Use a cool mist humidifier to moisten air. Be sure to clean according to manufacturer's instructions. ~ IMMEDIATE ACTION ~ CAUTIONS A warm, moist compress placed on face, over eyes for 15 to 20 minutes, 5 to 6 times a day, may help relieve the congestion. ~ Analgesic/Antipyretic Advice - Acetaminophen: Consider acetaminophen as directed on label or by pharmacist/provider for pain or fever PRECAUTIONS: - Use if there is no history of liver disease, alcoholism, or intake of three or more alcohol drinks per day - Only if approved by provider during pregnancy or when breastfeeding - During pregnancy, acetaminophen should not be taken more than 3 consecutive days without telling provider - Do not exceed recommended dose or frequency ~ ~ Rest  until symptoms improve. If more than [redacted] weeks pregnant, lie on left side when resting. See another provider immediately if unable to talk with your provider within 1 hour. Consider the closest urgent care or ED if an office or clinic is not available. Another adult should drive. ~ 29/52/8413 2:44:01UU Page 1 of 1 [id number]

## 2011-11-24 ENCOUNTER — Other Ambulatory Visit: Payer: Self-pay | Admitting: Internal Medicine

## 2011-11-24 NOTE — Telephone Encounter (Signed)
Done erx 

## 2012-01-01 ENCOUNTER — Other Ambulatory Visit: Payer: Self-pay | Admitting: Internal Medicine

## 2012-01-14 ENCOUNTER — Other Ambulatory Visit: Payer: Self-pay

## 2012-01-14 MED ORDER — BUPROPION HCL ER (XL) 300 MG PO TB24: 300.0000 mg | ORAL_TABLET | ORAL | Status: DC

## 2012-01-14 NOTE — Telephone Encounter (Signed)
Pt called stating she was advised that if she felt that she needed to increase her antidepressant to call. Pt is requesting this adjustment, please advise.

## 2012-01-14 NOTE — Telephone Encounter (Signed)
Ok to increase wellbutrin to 300g XL qd

## 2012-01-14 NOTE — Telephone Encounter (Signed)
Pt informed of Rx/pharmacy 

## 2012-01-25 ENCOUNTER — Other Ambulatory Visit (INDEPENDENT_AMBULATORY_CARE_PROVIDER_SITE_OTHER): Payer: 59

## 2012-01-25 ENCOUNTER — Other Ambulatory Visit: Payer: Self-pay

## 2012-01-25 ENCOUNTER — Ambulatory Visit: Payer: 59 | Admitting: Endocrinology

## 2012-01-25 ENCOUNTER — Ambulatory Visit (INDEPENDENT_AMBULATORY_CARE_PROVIDER_SITE_OTHER): Payer: 59 | Admitting: Adult Health

## 2012-01-25 ENCOUNTER — Telehealth: Payer: Self-pay

## 2012-01-25 DIAGNOSIS — R300 Dysuria: Secondary | ICD-10-CM

## 2012-01-25 DIAGNOSIS — Z0289 Encounter for other administrative examinations: Secondary | ICD-10-CM

## 2012-01-25 LAB — URINALYSIS, ROUTINE W REFLEX MICROSCOPIC
Specific Gravity, Urine: >=1.03 (ref 1.000–1.030)
Urine Glucose: NEGATIVE
Urobilinogen, UA: 0.2 (ref 0.0–1.0)

## 2012-01-25 MED ORDER — CIPROFLOXACIN HCL 500 MG PO TABS: 500.0000 mg | ORAL_TABLET | Freq: Two times a day (BID) | ORAL | Status: AC

## 2012-01-25 NOTE — Telephone Encounter (Signed)
Pt advised of UA.

## 2012-01-25 NOTE — Telephone Encounter (Signed)
ok 

## 2012-01-25 NOTE — Telephone Encounter (Signed)
Pt called c/o of UTI sxs. Pt is requesting order for UA, please advise.

## 2012-01-25 NOTE — Progress Notes (Signed)
Pt of Dr Diamantina Monks - not seen today.  Will be treated over the phone by PCP per pt's reports.  Appointment "unarrived."

## 2012-02-23 ENCOUNTER — Other Ambulatory Visit: Payer: Self-pay | Admitting: Internal Medicine

## 2012-02-24 ENCOUNTER — Other Ambulatory Visit: Payer: Self-pay | Admitting: *Deleted

## 2012-02-24 MED ORDER — BUTALBITAL-APAP-CAFFEINE 50-325-40 MG PO TABS: 1.0000 | ORAL_TABLET | ORAL | Status: DC | PRN

## 2012-02-24 MED ORDER — BUPROPION HCL ER (XL) 300 MG PO TB24: 300.0000 mg | ORAL_TABLET | ORAL | Status: DC

## 2012-02-24 NOTE — Telephone Encounter (Signed)
Received fax pt is wanting 90 day on her Bupropion & Bultalb-acetamin-caf. Sent bupropion, is it ok to send the butalb-acetamin?...Raechel Chute

## 2012-02-25 NOTE — Telephone Encounter (Signed)
MD ok 90 day on butalb-acetamin-caf both rx's sent back to [place]...Raechel Chute

## 2012-02-26 ENCOUNTER — Encounter: Payer: 59 | Admitting: Internal Medicine

## 2012-02-29 ENCOUNTER — Ambulatory Visit (INDEPENDENT_AMBULATORY_CARE_PROVIDER_SITE_OTHER): Payer: 59 | Admitting: Internal Medicine

## 2012-02-29 ENCOUNTER — Other Ambulatory Visit (INDEPENDENT_AMBULATORY_CARE_PROVIDER_SITE_OTHER): Payer: 59

## 2012-02-29 ENCOUNTER — Encounter: Payer: Self-pay | Admitting: Internal Medicine

## 2012-02-29 VITALS — BP 128/80 | HR 74 | Temp 97.5°F | Ht 64.0 in | Wt 190.0 lb

## 2012-02-29 DIAGNOSIS — G43909 Migraine, unspecified, not intractable, without status migrainosus: Secondary | ICD-10-CM

## 2012-02-29 DIAGNOSIS — Z0000 Encounter for general adult medical examination without abnormal findings: Secondary | ICD-10-CM

## 2012-02-29 DIAGNOSIS — E039 Hypothyroidism, unspecified: Secondary | ICD-10-CM

## 2012-02-29 DIAGNOSIS — F329 Major depressive disorder, single episode, unspecified: Secondary | ICD-10-CM

## 2012-02-29 LAB — CBC WITH DIFFERENTIAL/PLATELET
Basophils Relative: 0.5 % (ref 0.0–3.0)
Eosinophils Relative: 0.8 % (ref 0.0–5.0)
HCT: 37.3 % (ref 36.0–46.0)
Hemoglobin: 12.3 g/dL (ref 12.0–15.0)
Lymphs Abs: 1.6 10*3/uL (ref 0.7–4.0)
MCV: 91.8 fl (ref 78.0–100.0)
Monocytes Relative: 8.1 % (ref 3.0–12.0)
Neutro Abs: 3.1 10*3/uL (ref 1.4–7.7)
RBC: 4.06 Mil/uL (ref 3.87–5.11)
WBC: 5.2 10*3/uL (ref 4.5–10.5)

## 2012-02-29 LAB — URINALYSIS, ROUTINE W REFLEX MICROSCOPIC
Bilirubin Urine: NEGATIVE
Ketones, ur: NEGATIVE
Nitrite: NEGATIVE
Total Protein, Urine: NEGATIVE
Urine Glucose: NEGATIVE
pH: 6 (ref 5.0–8.0)

## 2012-02-29 LAB — HEPATIC FUNCTION PANEL
ALT: 21 U/L (ref 0–35)
Total Bilirubin: 0.3 mg/dL (ref 0.3–1.2)
Total Protein: 7.3 g/dL (ref 6.0–8.3)

## 2012-02-29 LAB — BASIC METABOLIC PANEL
Chloride: 107 mEq/L (ref 96–112)
GFR: 132.13 mL/min (ref >60.00)
Potassium: 4.3 mEq/L (ref 3.5–5.1)
Sodium: 140 mEq/L (ref 135–145)

## 2012-02-29 LAB — LIPID PANEL
Cholesterol: 152 mg/dL (ref 0–200)
HDL: 43.7 mg/dL (ref >39.00)
LDL Cholesterol: 90 mg/dL (ref 0–99)
VLDL: 18.8 mg/dL (ref 0.0–40.0)

## 2012-02-29 LAB — TSH: TSH: 1.22 u[IU]/mL (ref 0.35–5.50)

## 2012-02-29 MED ORDER — ONDANSETRON HCL 4 MG PO TABS: 4.0000 mg | ORAL_TABLET | Freq: Three times a day (TID) | ORAL | Status: DC | PRN

## 2012-02-29 NOTE — Assessment & Plan Note (Signed)
Suspect hormone related given hx age 25-21 yo symptoms and recurrence with pregnancy/postpartum Prior eval/imagine unremarkable per pt - will delay repeat imaging at this time as no red flags on hx or exam - Precipitated also by stress - poor tolerance of triptan due to side effects  Continue ongoing prn meds as discussed Consider topamax prophylaxis if continued daily symptoms despite prn meds Pt understands and agrees to same - f/u 6-8 wks for review, sooner if problems

## 2012-02-29 NOTE — Progress Notes (Signed)
Subjective:    Patient ID: Cheryl Hampton, female    DOB: [birth year], 26 y.o.   MRN: [id number]  HPI  patient is here today for annual physical. Patient feels well overall.  Also reviewed chronic medical issues:  Migraine headache - Dx migraines age 61y -eval with MRI and LP then unremarkable recurrent symptoms after birth of 1st child ?precipitated by pregnancy or postpartum nuvaring > changed to [facility] 12/2010 without improvement Temporary improvement in symptoms with vicodin and antiemetics; no improvement with OTC meds Started Esgic prn 01/2011 - much improved  Depression - onset symptoms postpartum 10/2010 -  Manifest with irritability noted by family and coworkers started sertraline 03/2011, Symptoms initially improved -  Then changed to wellbutrin 07/2011 and prn xanax Also separated 08/2011 - feels better overall about her situation  Hypothyroid - initially hyperthryoid - incidental dx 01/2011 cpx labs - Saw endo - 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 Depression   . Migraine headache   . 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 Alcohol abuse Maternal Uncle   . Pancreatic cancer Maternal Grandmother   . Stroke Other     grandparent  . Hypertension Other     parent., grandparent  . Diabetes Other     parent, grandparent   History  Substance Use Topics  . Smoking status: Never Smoker   . Smokeless tobacco: Not on file  . Alcohol Use: No   Review of Systems  Constitutional: Negative for fever.  HENT: Negative for congestion and sinus pressure.   Respiratory: Negative for shortness of breath.   Cardiovascular: Negative for chest pain.  Musculoskeletal: Positive for back pain (occasional).  Neurological: Negative for  seizures and weakness.  No other specific complaints in a complete review of systems (except as listed in HPI above).     Objective:   Physical Exam  BP 128/80  Pulse 74  Temp 97.5 F (36.4 C) (Oral)  Ht 5\' 4"  (1.626 m)  Wt 190 lb (86.183 kg)  BMI 32.61 kg/m2  SpO2 99% Wt Readings from Last 3 Encounters:  02/29/12 190 lb (86.183 kg)  10/29/11 187 lb 1.9 oz (84.877 kg)  07/27/11 202 lb 1.9 oz (91.681 kg)   Constitutional: She is overweight, but appears well-developed and well-nourished. No distress. HENT: Number somatic, atraumatic. Tympanic membranes clear bilaterally without effusion or erythema, no cerumen impaction. Nares clear. Dentition is good. Oropharynx clear  Neck: no palpable goiter, no LAD/JVD. Full range of motion, supple Cardiovascular: Normal rate, regular rhythm and normal heart sounds.  No murmur heard. No BLE edema. Pulmonary/Chest: Effort normal and breath sounds normal. No respiratory distress. She has no wheezes. Abdomen: Soft nontender nondistended, positive bowel sounds. No masses. No rebound or guarding GU: Defer to gynecology MSkel:no gross skeletal deformities.  Back: ful range of motion of thoracic and lumbar spine. Non tender to palpation. Negative straight leg raise. DTR's are symmetrically intact. Sensation intact in all dermatomes of the lower extremities. Full strength to manual muscle testing. patient is able to heel toe walk without difficulty and ambulates with non-antalgic gait. Neurologic: Awake alert oriented x4. Cranial nerves II through XII symmetrically intact. Moves all extremities well. Coordination, balance gait normal. Speech fluent without dysarthria  Psychiatric: She has a normal mood and affect. Her behavior is normal. Judgment and thought content normal.   Lab Results  Component Value Date   WBC 4.7 02/06/2011   HGB 12.1 02/06/2011   HCT 37.0 02/06/2011   PLT 334.0 02/06/2011   CHOL 179 02/06/2011   TRIG 252.0* 02/06/2011   HDL 40.00  02/06/2011   LDLDIRECT 122.7 02/06/2011   ALT 30 02/06/2011   AST 23 02/06/2011   NA 139 02/06/2011   K 3.7 02/06/2011   CL 106 02/06/2011   CREATININE 0.4 02/06/2011   BUN 10 02/06/2011   CO2 25 02/06/2011   TSH 0.75 10/29/2011       Assessment & Plan:  CPX/v70.0 - Patient has been counseled on age-appropriate routine health concerns for screening and prevention. These are reviewed and up-to-date. Immunizations are up-to-date or declined. Labs ordered and reviewed.  Also see problem list. Medications and labs reviewed today.

## 2012-02-29 NOTE — Assessment & Plan Note (Signed)
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

## 2012-02-29 NOTE — Patient Instructions (Signed)
It was good to see you today. We have reviewed your prior records including labs and tests today Health Maintenance reviewed - all recommended immunizations and age-appropriate screenings are up-to-date.  Test(s) ordered today. Your results will be called to you after review (48-72hours after test completion). If any changes need to be made, you will be notified at that time. continue current dose Wellbutrin as discussed for depression symptoms -ok to continue alprazolam for occasssional anxiety symptoms  Please schedule followup in 6 months for thyroid check and to review depression/migraine medsications; call sooner if problems.  Health Maintenance, Females A healthy lifestyle and preventative care can promote health and wellness.  Maintain regular health, dental, and eye exams.   Eat a healthy diet. Foods like vegetables, fruits, whole grains, low-fat dairy products, and lean protein foods contain the nutrients you need without too many calories. Decrease your intake of foods high in solid fats, added sugars, and salt. Get information about a proper diet from your caregiver, if necessary.   Regular physical exercise is one of the most important things you can do for your health. Most adults should get at least 150 minutes of moderate-intensity exercise (any activity that increases your heart rate and causes you to sweat) each week. In addition, most adults need muscle-strengthening exercises on 2 or more days a week.     Maintain a healthy weight. The body mass index (BMI) is a screening tool to identify possible weight problems. It provides an estimate of body fat based on height and weight. Your caregiver can help determine your BMI, and can help you achieve or maintain a healthy weight. For adults 20 years and older:   A BMI below 18.5 is considered underweight.   A BMI of 18.5 to 24.9 is normal.   A BMI of 25 to 29.9 is considered overweight.   A BMI of 30 and above is considered  obese.   Maintain normal blood lipids and cholesterol by exercising and minimizing your intake of saturated fat. Eat a balanced diet with plenty of fruits and vegetables. Blood tests for lipids and cholesterol should begin at age 66 and be repeated every 5 years. If your lipid or cholesterol levels are high, you are over 50, or you are a high risk for heart disease, you may need your cholesterol levels checked more frequently. Ongoing high lipid and cholesterol levels should be treated with medicines if diet and exercise are not effective.   If you smoke, find out from your caregiver how to quit. If you do not use tobacco, do not start.   If you are pregnant, do not drink alcohol. If you are breastfeeding, be very cautious about drinking alcohol. If you are not pregnant and choose to drink alcohol, do not exceed 1 drink per day. One drink is considered to be 12 ounces (355 mL) of beer, 5 ounces (148 mL) of wine, or 1.5 ounces (44 mL) of liquor.   Avoid use of street drugs. Do not share needles with anyone. Ask for help if you need support or instructions about stopping the use of drugs.   High blood pressure causes heart disease and increases the risk of stroke. Blood pressure should be checked at least every 1 to 2 years. Ongoing high blood pressure should be treated with medicines, if weight loss and exercise are not effective.   If you are 22 to 25 years old, ask your caregiver if you should take aspirin to prevent strokes.   Diabetes screening  involves taking a blood sample to check your fasting blood sugar level. This should be done once every 3 years, after age 37, if you are within normal weight and without risk factors for diabetes. Testing should be considered at a younger age or be carried out more frequently if you are overweight and have at least 1 risk factor for diabetes.   Breast cancer screening is essential preventative care for women. You should practice "breast self-awareness."  This means understanding the normal appearance and feel of your breasts and may include breast self-examination. Any changes detected, no matter how small, should be reported to a caregiver. Women in their 65s and 30s should have a clinical breast exam (CBE) by a caregiver as part of a regular health exam every 1 to 3 years. After age 36, women should have a CBE every year. Starting at age 86, women should consider having a mammogram (breast X-ray) every year. Women who have a family history of breast cancer should talk to their caregiver about genetic screening. Women at a high risk of breast cancer should talk to their caregiver about having an MRI and a mammogram every year.   The Pap test is a screening test for cervical cancer. Women should have a Pap test starting at age 20. Between ages 50 and 32, Pap tests should be repeated every 2 years. Beginning at age 39, you should have a Pap test every 3 years as long as the past 3 Pap tests have been normal. If you had a hysterectomy for a problem that was not cancer or a condition that could lead to cancer, then you no longer need Pap tests. If you are between ages 81 and 56, and you have had normal Pap tests going back 10 years, you no longer need Pap tests. If you have had past treatment for cervical cancer or a condition that could lead to cancer, you need Pap tests and screening for cancer for at least 20 years after your treatment. If Pap tests have been discontinued, risk factors (such as a new sexual partner) need to be reassessed to determine if screening should be resumed. Some women have medical problems that increase the chance of getting cervical cancer. In these cases, your caregiver may recommend more frequent screening and Pap tests.   The human papillomavirus (HPV) test is an additional test that may be used for cervical cancer screening. The HPV test looks for the virus that can cause the cell changes on the cervix. The cells collected during  the Pap test can be tested for HPV. The HPV test could be used to screen women aged 79 years and older, and should be used in women of any age who have unclear Pap test results. After the age of 19, women should have HPV testing at the same frequency as a Pap test.   Colorectal cancer can be detected and often prevented. Most routine colorectal cancer screening begins at the age of 62 and continues through age 32. However, your caregiver may recommend screening at an earlier age if you have risk factors for colon cancer. On a yearly basis, your caregiver may provide home test kits to check for hidden blood in the stool. Use of a small camera at the end of a tube, to directly examine the colon (sigmoidoscopy or colonoscopy), can detect the earliest forms of colorectal cancer. Talk to your caregiver about this at age 86, when routine screening begins. Direct examination of the colon should be repeated  every 5 to 10 years through age 51, unless early forms of pre-cancerous polyps or small growths are found.   Hepatitis C blood testing is recommended for all people born from [birth year] through 1964 and any individual with known risks for hepatitis C.   Practice safe sex. Use condoms and avoid high-risk sexual practices to reduce the spread of sexually transmitted infections (STIs). Sexually active women aged 65 and younger should be checked for Chlamydia, which is a common sexually transmitted infection. Older women with new or multiple partners should also be tested for Chlamydia. Testing for other STIs is recommended if you are sexually active and at increased risk.   Osteoporosis is a disease in which the bones lose minerals and strength with aging. This can result in serious bone fractures. The risk of osteoporosis can be identified using a bone density scan. Women ages 62 and over and women at risk for fractures or osteoporosis should discuss screening with their caregivers. Ask your caregiver whether you should  be taking a calcium supplement or vitamin D to reduce the rate of osteoporosis.   Menopause can be associated with physical symptoms and risks. Hormone replacement therapy is available to decrease symptoms and risks. You should talk to your caregiver about whether hormone replacement therapy is right for you.   Use sunscreen with a sun protection factor (SPF) of 30 or greater. Apply sunscreen liberally and repeatedly throughout the day. You should seek shade when your shadow is shorter than you. Protect yourself by wearing long sleeves, pants, a wide-brimmed hat, and sunglasses year round, whenever you are outdoors.   Notify your caregiver of new moles or changes in moles, especially if there is a change in shape or color. Also notify your caregiver if a mole is larger than the size of a pencil eraser.   Stay current with your immunizations.  Document Released: 12/22/2010 Document Revised: 05/28/2011 Document Reviewed: 12/22/2010 [facility] Patient Information 2012 [place], [place].

## 2012-02-29 NOTE — Assessment & Plan Note (Signed)
Hx same as teenager, exac postpartum 10/2010 Ongoing behav health counseling - started sertraline 03/2011 due to irritability noted by family and coworkers - symptoms initially improved but worse over holiday and winter months - occassional anxiety - uses low dose xanax as needed -  Changed to wellbutrin 07/2011, dose increased 12/2011 - feels improved Also separated from spouse 08/2011, but reports amicable split The current medical regimen is effective;  continue present plan and medications.

## 2012-03-29 ENCOUNTER — Encounter: Payer: Self-pay | Admitting: Internal Medicine

## 2012-03-29 DIAGNOSIS — E039 Hypothyroidism, unspecified: Secondary | ICD-10-CM

## 2012-03-30 NOTE — Telephone Encounter (Signed)
Lab Results  Component Value Date   TSH 1.22 02/29/2012   ask pt to come for lab only to recheck TSH - if lab still normal, suspect symptoms are related to weather changes, stress, possible mild depression... Sleep pattern described is normal. OV if symptoms worse or unimproved

## 2012-04-05 ENCOUNTER — Encounter: Payer: Self-pay | Admitting: Internal Medicine

## 2012-04-14 ENCOUNTER — Ambulatory Visit (INDEPENDENT_AMBULATORY_CARE_PROVIDER_SITE_OTHER): Payer: 59 | Admitting: Internal Medicine

## 2012-04-14 ENCOUNTER — Encounter: Payer: Self-pay | Admitting: Internal Medicine

## 2012-04-14 VITALS — BP 132/92 | HR 82 | Temp 98.0°F | Ht 64.0 in | Wt 195.5 lb

## 2012-04-14 DIAGNOSIS — F329 Major depressive disorder, single episode, unspecified: Secondary | ICD-10-CM

## 2012-04-14 MED ORDER — ESCITALOPRAM OXALATE 10 MG PO TABS: 10.0000 mg | ORAL_TABLET | Freq: Every day | ORAL | Status: DC

## 2012-04-14 NOTE — Progress Notes (Signed)
  Subjective:    Patient ID: Cheryl Hampton, female    DOB: [birth year], 26 y.o.   MRN: [id number]  HPI  Here for increase depression symptoms   Past Medical History  Diagnosis Date  . Depression   . Migraine headache   . 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  HENT: Negative for congestion and sinus pressure.   Respiratory: Negative for shortness of breath.   Cardiovascular: Negative for chest pain.  Neurological: Negative for seizures and weakness.      Objective:   Physical Exam  BP 132/92  Pulse 82  Temp 98 F (36.7 C) (Oral)  Ht 5\' 4"  (1.626 m)  Wt 195 lb 8 oz (88.678 kg)  BMI 33.56 kg/m2  SpO2 99% Wt Readings from Last 3 Encounters:  04/14/12 195 lb 8 oz (88.678 kg)  02/29/12 190 lb (86.183 kg)  10/29/11 187 lb 1.9 oz (84.877 kg)   Constitutional: She is overweight; She appears well-developed and well-nourished. No distress. Neck: no palpable goiter Cardiovascular: Normal rate, regular rhythm and normal heart sounds.  No murmur heard. No BLE edema. Pulmonary/Chest: Effort normal and breath sounds normal. No respiratory distress. She has no wheezes Psychiatric: She has a dysphoric mood and affect. Her behavior is normal. Judgment and thought content normal.   Lab Results  Component Value Date   WBC 5.2 02/29/2012   HGB 12.3 02/29/2012   HCT 37.3 02/29/2012   PLT 301.0 02/29/2012   CHOL 152 02/29/2012   TRIG 94.0 02/29/2012   HDL 43.70 02/29/2012   LDLDIRECT 122.7 02/06/2011   ALT 21 02/29/2012   AST 17 02/29/2012   NA 140 02/29/2012   K 4.3 02/29/2012   CL 107 02/29/2012   CREATININE 0.6 02/29/2012   BUN 15 02/29/2012   CO2 26 02/29/2012   TSH 1.22 02/29/2012       Assessment & Plan:  See problem list. Medications and labs reviewed today.

## 2012-04-14 NOTE — Patient Instructions (Signed)
It was good to see you today. We'll add Lexapro to ongoing Wellbutrin - Your prescription(s) have been submitted to your pharmacy. Please take as directed and contact our office if you believe you are having problem(s) with the medication(s). Please schedule followup in 3 months, call sooner if problems.

## 2012-04-14 NOTE — Assessment & Plan Note (Signed)
Hx same as teenager, exac postpartum 10/2010 Encouraged to resume behav health counseling ([facility]) -  started sertraline 03/2011 due to irritability noted by family and coworkers - symptoms initially improved but worse over holiday and winter months - associated with occassional anxiety - uses low dose xanax as needed -  Changed to wellbutrin 07/2011, dose increased 12/2011 - initially improved Also separated from spouse 08/2011, but reports amicable split Add lexapro to wellbutrin now - encouraged light box therapy and increase exercise The current medical regimen is effective;  continue present plan and medications.

## 2012-04-25 ENCOUNTER — Other Ambulatory Visit: Payer: Self-pay | Admitting: Internal Medicine

## 2012-04-25 MED ORDER — ONDANSETRON HCL 4 MG PO TABS: 4.0000 mg | ORAL_TABLET | Freq: Three times a day (TID) | ORAL | Status: DC | PRN

## 2012-05-09 ENCOUNTER — Encounter: Payer: Self-pay | Admitting: Internal Medicine

## 2012-05-09 MED ORDER — TOPIRAMATE 25 MG PO TABS: 25.0000 mg | ORAL_TABLET | Freq: Two times a day (BID) | ORAL | Status: DC

## 2012-05-18 ENCOUNTER — Other Ambulatory Visit: Payer: Self-pay | Admitting: Internal Medicine

## 2012-05-18 NOTE — Telephone Encounter (Signed)
Faxed script back to [place]...lmb 

## 2012-05-23 ENCOUNTER — Other Ambulatory Visit: Payer: Self-pay | Admitting: Internal Medicine

## 2012-05-23 ENCOUNTER — Other Ambulatory Visit: Payer: Self-pay | Admitting: *Deleted

## 2012-05-23 MED ORDER — LEVOTHYROXINE SODIUM 50 MCG PO TABS: ORAL_TABLET | ORAL | Status: DC

## 2012-05-27 ENCOUNTER — Encounter: Payer: Self-pay | Admitting: Internal Medicine

## 2012-05-27 ENCOUNTER — Ambulatory Visit (INDEPENDENT_AMBULATORY_CARE_PROVIDER_SITE_OTHER): Payer: 59 | Admitting: Internal Medicine

## 2012-05-27 VITALS — BP 132/88 | HR 86 | Temp 98.1°F | Ht 64.0 in | Wt 198.0 lb

## 2012-05-27 DIAGNOSIS — E039 Hypothyroidism, unspecified: Secondary | ICD-10-CM

## 2012-05-27 DIAGNOSIS — F32A Depression, unspecified: Secondary | ICD-10-CM

## 2012-05-27 DIAGNOSIS — F329 Major depressive disorder, single episode, unspecified: Secondary | ICD-10-CM

## 2012-05-27 DIAGNOSIS — G43909 Migraine, unspecified, not intractable, without status migrainosus: Secondary | ICD-10-CM

## 2012-05-27 MED ORDER — TOPIRAMATE 25 MG PO TABS: 50.0000 mg | ORAL_TABLET | Freq: Two times a day (BID) | ORAL | Status: DC

## 2012-05-27 NOTE — Assessment & Plan Note (Signed)
Hx same as teenager, exac postpartum 10/2010 intermittent behav health counseling ([facility]) -  started sertraline 03/2011 due to irritability, worse over holiday and winter months - Overlap with anxiety - uses low dose xanax as needed -  Changed to SSRI to wellbutrin 07/2011, titrated 12/2011 -  Added lexapro 03/2012 - feels improved Also separated from spouse 08/2011, but reports amicable split encouraged continued light box therapy and increase exercise The current medical regimen is effective;  continue present plan and medications.  

## 2012-05-27 NOTE — Progress Notes (Signed)
  Subjective:    Patient ID: Cheryl Hampton, female    DOB: 15-Oct-1985, 26 y.o.   MRN: [id number]  HPI  Here for follow up   Past Medical History  Diagnosis Date  . Depression   . Migraine headache   . 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  HENT: Negative for congestion and sinus pressure.   Respiratory: Negative for shortness of breath.   Cardiovascular: Negative for chest pain.  Neurological: Negative for seizures and weakness.      Objective:   Physical Exam  BP 132/88  Pulse 86  Temp 98.1 F (36.7 C) (Oral)  Ht 5\' 4"  (1.626 m)  Wt 198 lb (89.812 kg)  BMI 33.99 kg/m2  SpO2 97% Wt Readings from Last 3 Encounters:  05/27/12 198 lb (89.812 kg)  04/14/12 195 lb 8 oz (88.678 kg)  02/29/12 190 lb (86.183 kg)   Constitutional: She is overweight,but appears well-developed and well-nourished. No distress. Dtr at side Neck: no palpable goiter Cardiovascular: Normal rate, regular rhythm and normal heart sounds.  No murmur heard. No BLE edema. Pulmonary/Chest: Effort normal and breath sounds normal. No respiratory distress. She has no wheezes Neuro: Awake alert oriented x4. Cranial nerves II through XII symmetrically intact. Speech fluent, no dysarthria or aphasia. Gait and balance normal, coordination normal. Moves all extremities well without gross deficit Psychiatric: She has a normal mood and affect. Her behavior is normal. Judgment and thought content normal.   Lab Results  Component Value Date   WBC 5.2 02/29/2012   HGB 12.3 02/29/2012   HCT 37.3 02/29/2012   PLT 301.0 02/29/2012   CHOL 152 02/29/2012   TRIG 94.0 02/29/2012   HDL 43.70 02/29/2012   LDLDIRECT 122.7 02/06/2011   ALT 21 02/29/2012   AST 17 02/29/2012   NA 140 02/29/2012   K 4.3 02/29/2012   CL 107 02/29/2012   CREATININE 0.6 02/29/2012   BUN 15 02/29/2012   CO2 26 02/29/2012   TSH 1.22 02/29/2012       Assessment & Plan:  See problem list. Medications  and labs reviewed today.

## 2012-05-27 NOTE — Assessment & Plan Note (Signed)
Suspect hormone related given hx age 25-21 yo symptoms and recurrence with pregnancy/postpartum Prior eval/imagine unremarkable per pt - will delay repeat imaging at this time as no red flags on hx or exam - Precipitated also by stress (family, holidays, work), but reports "stress" under control -  poor tolerance of triptan due to side effects  began topamax 04/2012, slow titration ongoing but no change  If unimproved in 6-8 wks, pt will contact us for refer to neuro as needed

## 2012-05-27 NOTE — Assessment & Plan Note (Signed)
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

## 2012-05-27 NOTE — Patient Instructions (Addendum)
It was good to see you today. Increase Topamax as discussed - if headache still unimproved in next 4 weeks, call for refer to neuro as needed Your prescription(s) have been submitted to your pharmacy. Please take as directed and contact our office if you believe you are having problem(s) with the medication(s). Other Medications reviewed and updated Please schedule followup in 3 months, call sooner if problems.

## 2012-06-28 ENCOUNTER — Other Ambulatory Visit: Payer: Self-pay | Admitting: Internal Medicine

## 2012-08-08 ENCOUNTER — Ambulatory Visit: Payer: 59 | Admitting: Internal Medicine

## 2012-08-17 ENCOUNTER — Encounter: Payer: Self-pay | Admitting: Internal Medicine

## 2012-08-30 ENCOUNTER — Encounter: Payer: Self-pay | Admitting: Internal Medicine

## 2012-08-30 ENCOUNTER — Other Ambulatory Visit (INDEPENDENT_AMBULATORY_CARE_PROVIDER_SITE_OTHER): Payer: 59

## 2012-08-30 ENCOUNTER — Other Ambulatory Visit: Payer: Self-pay | Admitting: Internal Medicine

## 2012-08-30 ENCOUNTER — Ambulatory Visit (INDEPENDENT_AMBULATORY_CARE_PROVIDER_SITE_OTHER): Payer: 59 | Admitting: Internal Medicine

## 2012-08-30 VITALS — BP 120/70 | HR 76 | Temp 97.9°F | Wt 207.8 lb

## 2012-08-30 DIAGNOSIS — E039 Hypothyroidism, unspecified: Secondary | ICD-10-CM

## 2012-08-30 DIAGNOSIS — G43909 Migraine, unspecified, not intractable, without status migrainosus: Secondary | ICD-10-CM

## 2012-08-30 DIAGNOSIS — F329 Major depressive disorder, single episode, unspecified: Secondary | ICD-10-CM

## 2012-08-30 DIAGNOSIS — M65849 Other synovitis and tenosynovitis, unspecified hand: Secondary | ICD-10-CM

## 2012-08-30 DIAGNOSIS — M65839 Other synovitis and tenosynovitis, unspecified forearm: Secondary | ICD-10-CM

## 2012-08-30 DIAGNOSIS — M778 Other enthesopathies, not elsewhere classified: Secondary | ICD-10-CM

## 2012-08-30 LAB — TSH: TSH: 1 u[IU]/mL (ref 0.35–5.50)

## 2012-08-30 MED ORDER — ESCITALOPRAM OXALATE 10 MG PO TABS: 10.0000 mg | ORAL_TABLET | Freq: Every day | ORAL | Status: DC

## 2012-08-30 MED ORDER — BUPROPION HCL ER (XL) 300 MG PO TB24: 300.0000 mg | ORAL_TABLET | ORAL | Status: DC

## 2012-08-30 MED ORDER — IBUPROFEN 600 MG PO TABS: 600.0000 mg | ORAL_TABLET | Freq: Three times a day (TID) | ORAL | Status: DC | PRN

## 2012-08-30 NOTE — Patient Instructions (Signed)
It was good to see you today. ibuprofen 600mg  TID x 1 week, then as needed for hand/worst pain - Your prescription(s) have been submitted to your pharmacy. Please take as directed and contact our office if you believe you are having problem(s) with the medication(s). Your prescription(s) have been submitted to your pharmacy. Please take as directed and contact our office if you believe you are having problem(s) with the medication(s). Other Medications reviewed and updated - Refill on medication(s) as discussed today. Please schedule followup in 6 months for annual and labs, call sooner if problems. Work on lifestyle changes as discussed (low fat, low carb, increased protein diet; improved exercise efforts; weight loss) to control sugar, blood pressure and cholesterol levels and/or reduce risk of developing other medical problems. Look into [email or web address] or other type of food journal to assist you in this process.

## 2012-08-30 NOTE — Assessment & Plan Note (Signed)
Hx same as teenager, exac postpartum 10/2010 intermittent behav health counseling ([facility]) -  started sertraline 03/2011 due to irritability, worse over holiday and winter months - Overlap with anxiety - uses low dose xanax as needed -  Changed to SSRI to wellbutrin 07/2011, titrated 12/2011 -  Added lexapro 03/2012 - feels improved Also separated from spouse 08/2011, but reports amicable split encouraged continued light box therapy and increase exercise The current medical regimen is effective;  continue present plan and medications.

## 2012-08-30 NOTE — Progress Notes (Signed)
  Subjective:    Patient ID: Cheryl Hampton, female    DOB: Jul 03, 1985, 27 y.o.   MRN: [id number]  HPI  Here for follow up   complains of pain hand below little finger -  Worse with direct pressure and overuse No swelling or injury  Past Medical History  Diagnosis Date  . Depression   . Migraine headache   . 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 Negative for fever.  HENT: Negative for congestion and sinus pressure.   Respiratory: Negative for shortness of breath.   Cardiovascular: Negative for chest pain.  Neurological: Negative for seizures and weakness.      Objective:   Physical Exam  BP 120/70  Pulse 76  Temp(Src) 97.9 F (36.6 C) (Oral)  Wt 207 lb 12.8 oz (94.257 kg)  BMI 35.65 kg/m2  SpO2 98% Wt Readings from Last 3 Encounters:  08/30/12 207 lb 12.8 oz (94.257 kg)  05/27/12 198 lb (89.812 kg)  04/14/12 195 lb 8 oz (88.678 kg)   Constitutional: She is overweight,but appears well-developed and well-nourished. No distress. Neck: no palpable goiter Cardiovascular: Normal rate, regular rhythm and normal heart sounds.  No murmur heard. No BLE edema. Pulmonary/Chest: Effort normal and breath sounds normal. No respiratory distress. She has no wheezes Neuro: Awake alert oriented x4. Cranial nerves II through XII symmetrically intact. Speech fluent, no dysarthria or aphasia. Gait and balance normal, coordination normal. Moves all extremities well without gross deficit MSkel: R hand - no gross deformities, pain with active and passive ulnar and radial deviation at ulnar side of hand, but full range of motion - ligamentous function intact and NV intact distally Psychiatric: She has a normal mood and affect. Her behavior is normal. Judgment and thought content normal.   Lab Results  Component Value Date   WBC 5.2 02/29/2012   HGB 12.3 02/29/2012   HCT 37.3 02/29/2012   PLT 301.0  02/29/2012   CHOL 152 02/29/2012   TRIG 94.0 02/29/2012   HDL 43.70 02/29/2012   LDLDIRECT 122.7 02/06/2011   ALT 21 02/29/2012   AST 17 02/29/2012   NA 140 02/29/2012   K 4.3 02/29/2012   CL 107 02/29/2012   CREATININE 0.6 02/29/2012   BUN 15 02/29/2012   CO2 26 02/29/2012   TSH 1.22 02/29/2012       Assessment & Plan:  See problem list. Medications and labs reviewed today.  Weight gain - working on diet and exercise changes - check TSH nd encouragement provided - continue food journal ing as much as possible  Ulnar hand pain - exam consistent with tendonitis vs peripheral compressiove neuropathy - advised avoidance of pressure, ice and NSAIDs - consider NCS or hand referral if uimproved with conservative care measures

## 2012-08-30 NOTE — Assessment & Plan Note (Signed)
Suspect hormone related given hx age 26-21 yo symptoms and recurrence with pregnancy/postpartum Prior eval/imagine unremarkable per pt - will delay repeat imaging at this time as no red flags on hx or exam - Precipitated also by stress (family, holidays, work), but reports "stress" under control -  poor tolerance of triptan due to side effects  began topamax 04/2012, slow titration ongoing but no change - improved

## 2012-08-30 NOTE — Assessment & Plan Note (Signed)
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   

## 2012-08-30 NOTE — Progress Notes (Signed)
Subjective:    Patient ID: Cheryl Hampton, female    DOB: 07/22/1985, 27 y.o.   MRN: [id number]  HPI R Wrist Pain- Patient notices pain over the ulnar aspect of her right wrist that has begun some time ago, acutely worsened yesterday.  She states the ulnar aspect of the hand was painful, tender and swollen yesterday. Her job requires lots of typing and writing and she feels she may have aggravated it there. She is right hand dominant. Denies tingling, numbness, or burning of the hand or fingers. The pain worsens with use. She has tried ibuprofen for the pain one time yesterday but denies regular schedule of medication. She has also bought a splint for her wrist.   Weight Gain- The patient has gained 9 lbs since her last visit. She denies changes in appetite and states it may be due to stress. She says she is active at work and home and does not indulge in fast food. She is not wearing a pedometer or counting calories. One attempt at weight watchers was unsuccessful due to the amount of time of calculating and recording meals.  She has started [place] 1 week ago and has had a two pound weight loss in that time.   Hypothyroidism- Patient is unsure if weight gain is due to this. She says she has had some hair loss. Denies nausea, vomiting, diarrhea, or constipation.   Depression- Patient is controlled with citalopram and Wellbutrin.  She is taking very little alprazolam and has noted a decrease in anxiety as well as less fatigue. She is tolerating the medications with no SE.  H/O Migraine- Migraines have been controlled since starting Topamax therapy. She denies SE of the medications and is tolerating it well.   Past Medical History  Diagnosis Date  . Depression   . Migraine headache   . 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 Negative for fever, activity change, appetite change and  fatigue.  HENT: Negative for congestion, sore throat and rhinorrhea.   Respiratory: Negative for chest tightness and shortness of breath.   Cardiovascular: Negative for chest pain and palpitations.  Gastrointestinal: Negative for nausea, vomiting, abdominal pain, diarrhea and constipation.  Endocrine: Negative for cold intolerance and heat intolerance.  Musculoskeletal: Positive for joint swelling (over the ulnar aspect of the right hand).  Neurological: Negative for weakness (in the right hand) and numbness (in the right hand).  Hematological: Negative for adenopathy.       Objective:   Physical Exam  Constitutional: She is oriented to person, place, and time. She appears well-developed and well-nourished. No distress.  Patient is overweight  HENT:  Head: Normocephalic and atraumatic.  Right Ear: Tympanic membrane and external ear normal.  Left Ear: Tympanic membrane and external ear normal.  Nose: Nose normal. No mucosal edema or rhinorrhea.  Mouth/Throat: Oropharynx is clear and moist. No oropharyngeal exudate.  Eyes: Conjunctivae are normal. Pupils are equal, round, and reactive to light. Right eye exhibits no discharge. Left eye exhibits no discharge. No scleral icterus.  Neck: Normal range of motion. Neck supple. No tracheal deviation present. No thyromegaly present.  Cardiovascular: Normal rate, regular rhythm and normal heart sounds.  Exam reveals no gallop and no friction rub.   No murmur heard. Pulmonary/Chest: Effort normal and breath sounds normal. No respiratory distress. She has no wheezes.  Abdominal: Soft. Bowel sounds are normal.  She exhibits no mass. There is no tenderness.  Musculoskeletal: Normal range of motion.       Right elbow: Normal.      Left elbow: Normal.       Right wrist: Normal.       Left wrist: Normal.       Arms: Lymphadenopathy:    She has no cervical adenopathy.  Neurological: She is alert and oriented to person, place, and time. She has normal  strength and normal reflexes.  Skin: Skin is warm and dry. No erythema.  Psychiatric: She has a normal mood and affect. Her behavior is normal. Judgment and thought content normal.   Filed Vitals:   08/30/12 0847  BP: 120/70  Pulse: 76  Temp: 97.9 F (36.6 C)   Lab Results  Component Value Date   WBC 5.2 02/29/2012   HGB 12.3 02/29/2012   HCT 37.3 02/29/2012   PLT 301.0 02/29/2012   GLUCOSE 83 02/29/2012   CHOL 152 02/29/2012   TRIG 94.0 02/29/2012   HDL 43.70 02/29/2012   LDLDIRECT 122.7 02/06/2011   LDLCALC 90 02/29/2012   ALT 21 02/29/2012   AST 17 02/29/2012   NA 140 02/29/2012   K 4.3 02/29/2012   CL 107 02/29/2012   CREATININE 0.6 02/29/2012   BUN 15 02/29/2012   CO2 26 02/29/2012   TSH 1.22 02/29/2012       Assessment & Plan:  R Wrist Tendonitis- Rx'd 600 mg Ibuprofen tid with meals x 1 week. Rest wrist and use splint prn. Ice wrist at the end of the day for approximately 15 minutes. If pain does not abate, worsens, or new symptoms appear we will re-evaluate.   Weight Gain- Continue with [facility]al[facility]. Encouraged pedometer use. Will continue to monitor at upcoming appointments.   Hypothyroidism- TSH to be drawn today. Will adjust medications if needed. No changes to therapies at this time.   Depression- Continue with current therapy. No changes made at this time. Will continue to monitor.  H/O Migraine- Continue with Topamax therapy. No changes made at this time. Will continue to monitor.   Follow up for chronic medication conditions in 6 months, or sooner if needed.   Jennifer Little PA-S  I have personally reviewed this case with PA student. I also personally examined this patient. I agree with history and findings as documented above. I reviewed, discussed and approve of the assessment and plan as listed above. Rene Paci, MD

## 2012-09-26 ENCOUNTER — Other Ambulatory Visit: Payer: Self-pay | Admitting: Internal Medicine

## 2012-09-26 MED ORDER — ALPRAZOLAM 0.25 MG PO TABS: ORAL_TABLET | ORAL | Status: DC

## 2012-09-26 NOTE — Telephone Encounter (Signed)
Last written 05/18/2012 #90 with 0 refills-please advise.

## 2012-10-10 ENCOUNTER — Other Ambulatory Visit (INDEPENDENT_AMBULATORY_CARE_PROVIDER_SITE_OTHER): Payer: 59

## 2012-10-10 ENCOUNTER — Other Ambulatory Visit: Payer: Self-pay | Admitting: Internal Medicine

## 2012-10-10 ENCOUNTER — Ambulatory Visit (INDEPENDENT_AMBULATORY_CARE_PROVIDER_SITE_OTHER): Payer: 59 | Admitting: Internal Medicine

## 2012-10-10 ENCOUNTER — Encounter: Payer: Self-pay | Admitting: Internal Medicine

## 2012-10-10 ENCOUNTER — Encounter: Payer: Self-pay | Admitting: *Deleted

## 2012-10-10 VITALS — BP 120/82 | HR 76 | Temp 98.5°F | Wt 211.8 lb

## 2012-10-10 DIAGNOSIS — R112 Nausea with vomiting, unspecified: Secondary | ICD-10-CM

## 2012-10-10 DIAGNOSIS — H8110 Benign paroxysmal vertigo, unspecified ear: Secondary | ICD-10-CM

## 2012-10-10 LAB — CBC WITH DIFFERENTIAL/PLATELET
Basophils Absolute: 0 10*3/uL (ref 0.0–0.1)
Eosinophils Absolute: 0.1 10*3/uL (ref 0.0–0.7)
HCT: 39 % (ref 36.0–46.0)
Hemoglobin: 13.3 g/dL (ref 12.0–15.0)
Lymphocytes Relative: 29.7 % (ref 12.0–46.0)
Lymphs Abs: 1.8 10*3/uL (ref 0.7–4.0)
MCHC: 34.1 g/dL (ref 30.0–36.0)
Neutro Abs: 3.6 10*3/uL (ref 1.4–7.7)
Platelets: 283 10*3/uL (ref 150.0–400.0)
RDW: 13.6 % (ref 11.5–14.6)

## 2012-10-10 LAB — HCG, QUANTITATIVE, PREGNANCY: hCG, Beta Chain, Quant, S: 0.29 m[IU]/mL

## 2012-10-10 LAB — HEPATIC FUNCTION PANEL
Albumin: 3.8 g/dL (ref 3.5–5.2)
Total Bilirubin: 0.5 mg/dL (ref 0.3–1.2)

## 2012-10-10 MED ORDER — PROMETHAZINE HCL 25 MG PO TABS: 25.0000 mg | ORAL_TABLET | Freq: Four times a day (QID) | ORAL | Status: DC | PRN

## 2012-10-10 NOTE — Progress Notes (Signed)
  Subjective:    Patient ID: Cheryl Hampton, female    DOB: 10-10-1985, 27 y.o.   MRN: [id number]  HPI  Concern for dizziness Onset 3 days ago associated with nausea, but no abdominal pain or vomiting ?pregnanacy - though has arm implants and one fallopian tube  Past Medical History  Diagnosis Date  . Depression   . Migraine headache   . 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 Negative for unexpected weight change.  Gastrointestinal: Positive for nausea. Negative for vomiting, abdominal pain and diarrhea.  Genitourinary: Negative for dysuria, frequency, flank pain, menstrual problem and pelvic pain.  Neurological: Positive for dizziness. Negative for tremors, facial asymmetry, weakness, light-headedness, numbness and headaches.       Objective:   Physical Exam BP 120/82  Pulse 76  Temp(Src) 98.5 F (36.9 C) (Oral)  Wt 211 lb 12.8 oz (96.072 kg)  BMI 36.34 kg/m2  SpO2 99% Wt Readings from Last 3 Encounters:  10/10/12 211 lb 12.8 oz (96.072 kg)  08/30/12 207 lb 12.8 oz (94.257 kg)  05/27/12 198 lb (89.812 kg)   Constitutional: She is overweight, but appears well-developed and well-nourished. No distress.  HENT: Head: Normocephalic and atraumatic. Sinus nontender Ears: B TMs ok, no erythema or effusion; Nose: Nose normal. Mouth/Throat: Oropharynx is clear and moist. No oropharyngeal exudate.  Eyes: No nystagmus. Conjunctivae and EOM are normal. Pupils are equal, round, and reactive to light. No scleral icterus.  Neck: Normal range of motion. Neck supple. No JVD present. No thyromegaly present.  Cardiovascular: Normal rate, regular rhythm and normal heart sounds.  No murmur heard. No BLE edema. Pulmonary/Chest: Effort normal and breath sounds normal. No respiratory distress. She has no wheezes.  Abdominal: Soft. Bowel sounds are normal. She exhibits no distension. There is no tenderness. no  masses Neurological: She is alert and oriented to person, place, and time. No cranial nerve deficit. Coordination, balance, strength, speech and gait are normal.  Skin: Skin is warm and dry. No rash noted. No erythema.  Psychiatric: She has a normal mood and affect. Her behavior is normal. Judgment and thought content normal.   Lab Results  Component Value Date   WBC 5.2 02/29/2012   HGB 12.3 02/29/2012   HCT 37.3 02/29/2012   PLT 301.0 02/29/2012   GLUCOSE 83 02/29/2012   CHOL 152 02/29/2012   TRIG 94.0 02/29/2012   HDL 43.70 02/29/2012   LDLDIRECT 122.7 02/06/2011   LDLCALC 90 02/29/2012   ALT 21 02/29/2012   AST 17 02/29/2012   NA 140 02/29/2012   K 4.3 02/29/2012   CL 107 02/29/2012   CREATININE 0.6 02/29/2012   BUN 15 02/29/2012   CO2 26 02/29/2012   TSH 1.00 08/30/2012       Assessment & Plan:   Dizzy x 72h Nausea without vomiting  Check bHCG rule out pregnancy - hx ectopic in past - Neuro and ENT exam benign Suspect BPPV tx with promethazine if labs ok Check CBC and LFTs too

## 2012-10-10 NOTE — Patient Instructions (Signed)
It was good to see you today. We have reviewed your prior records including labs and tests today Test(s) ordered today. Your results will be released to MyChart (or called to you) after review, usually within 72hours after test completion. If any changes need to be made, you will be notified at that same time. If lab tests normal, use promethazine every 6 hours as needed for nausea and dizziness symptoms - Your prescription(s) have been submitted to your pharmacy. Please take as directed and contact our office if you believe you are having problem(s) with the medication(s).  Vertigo Vertigo means you feel like you or your surroundings are moving when they are not. Vertigo can be dangerous if it occurs when you are at work, driving, or performing difficult activities.  CAUSES  Vertigo occurs when there is a conflict of signals sent to your brain from the visual and sensory systems in your body. There are many different causes of vertigo, including:  Infections, especially in the inner ear.  A bad reaction to a drug or misuse of alcohol and medicines.  Withdrawal from drugs or alcohol.  Rapidly changing positions, such as lying down or rolling over in bed.  A migraine headache.  Decreased blood flow to the brain.  Increased pressure in the brain from a head injury, infection, tumor, or bleeding. SYMPTOMS  You may feel as though the world is spinning around or you are falling to the ground. Because your balance is upset, vertigo can cause nausea and vomiting. You may have involuntary eye movements (nystagmus). DIAGNOSIS  Vertigo is usually diagnosed by physical exam. If the cause of your vertigo is unknown, your caregiver may perform imaging tests, such as an MRI scan (magnetic resonance imaging). TREATMENT  Most cases of vertigo resolve on their own, without treatment. Depending on the cause, your caregiver may prescribe certain medicines. If your vertigo is related to body position issues,  your caregiver may recommend movements or procedures to correct the problem. In rare cases, if your vertigo is caused by certain inner ear problems, you may need surgery. HOME CARE INSTRUCTIONS   Follow your caregiver's instructions.  Avoid driving.  Avoid operating heavy machinery.  Avoid performing any tasks that would be dangerous to you or others during a vertigo episode.  Tell your caregiver if you notice that certain medicines seem to be causing your vertigo. Some of the medicines used to treat vertigo episodes can actually make them worse in some people. SEEK IMMEDIATE MEDICAL CARE IF:   Your medicines do not relieve your vertigo or are making it worse.  You develop problems with talking, walking, weakness, or using your arms, hands, or legs.  You develop severe headaches.  Your nausea or vomiting continues or gets worse.  You develop visual changes.  A family member notices behavioral changes.  Your condition gets worse. MAKE SURE YOU:  Understand these instructions.  Will watch your condition.  Will get help right away if you are not doing well or get worse. Document Released: 03/18/2005 Document Revised: 08/31/2011 Document Reviewed: 12/25/2010 [facility] Patient Information 2013 [place], [place].

## 2012-11-29 ENCOUNTER — Other Ambulatory Visit: Payer: Self-pay | Admitting: Internal Medicine

## 2012-11-29 MED ORDER — BUTALBITAL-APAP-CAFFEINE 50-325-40 MG PO TABS: 1.0000 | ORAL_TABLET | ORAL | Status: DC | PRN

## 2012-11-29 NOTE — Telephone Encounter (Signed)
MD out of office. Pls advise.../lmb 

## 2012-11-29 NOTE — Telephone Encounter (Signed)
Rx faxed to [facility].

## 2012-12-06 ENCOUNTER — Encounter: Payer: Self-pay | Admitting: Internal Medicine

## 2012-12-21 ENCOUNTER — Other Ambulatory Visit: Payer: Self-pay | Admitting: Internal Medicine

## 2012-12-21 MED ORDER — ONDANSETRON HCL 4 MG PO TABS: ORAL_TABLET | ORAL | Status: DC

## 2012-12-28 ENCOUNTER — Encounter: Payer: Self-pay | Admitting: Internal Medicine

## 2012-12-29 MED ORDER — PHENTERMINE HCL 37.5 MG PO CAPS: 37.5000 mg | ORAL_CAPSULE | ORAL | Status: DC

## 2012-12-29 NOTE — Telephone Encounter (Signed)
Called pt no answer [facility] rx ready for pick-up.../lmb 

## 2013-01-09 ENCOUNTER — Ambulatory Visit (INDEPENDENT_AMBULATORY_CARE_PROVIDER_SITE_OTHER): Payer: 59 | Admitting: Internal Medicine

## 2013-01-09 ENCOUNTER — Encounter: Payer: Self-pay | Admitting: Internal Medicine

## 2013-01-09 VITALS — BP 120/82 | HR 81 | Temp 98.2°F | Wt 209.1 lb

## 2013-01-09 DIAGNOSIS — F329 Major depressive disorder, single episode, unspecified: Secondary | ICD-10-CM

## 2013-01-09 DIAGNOSIS — E669 Obesity, unspecified: Secondary | ICD-10-CM

## 2013-01-09 MED ORDER — PHENTERMINE HCL 37.5 MG PO CAPS: 37.5000 mg | ORAL_CAPSULE | ORAL | Status: DC

## 2013-01-09 NOTE — Assessment & Plan Note (Addendum)
Wt Readings from Last 3 Encounters:  01/09/13 209 lb 1.9 oz (94.856 kg)  10/10/12 211 lb 12.8 oz (96.072 kg)  08/30/12 207 lb 12.8 oz (94.257 kg)  Body mass index is 35.88 kg/(m^2). Will use Phentermine to help reach weight loss goals - today prescription for 2nd of 3 consecutive rx provided - Has done well in first 30 days, 8 pound weight loss reviewed Denies side effects or problems Continue same with encouragement on aerobic exercise to accompany diet changes for sustained weight loss

## 2013-01-09 NOTE — Assessment & Plan Note (Signed)
Hx same as teenager, exac postpartum 10/2010 intermittent behav health counseling ([facility]) -  started sertraline 03/2011 due to irritability; also overlap with anxiety prompting low dose xanax as needed -  Changed to SSRI to wellbutrin 07/2011, titrated 12/2011 -  Added lexapro 03/2012 - improved indep stopped all meds 09/2012 and reports doing well at this time Note she separated from spouse 08/2011, but reports amicable split

## 2013-01-09 NOTE — Progress Notes (Signed)
  Subjective:    Patient ID: Cheryl Hampton, female    DOB: Dec 25, 1985, 27 y.o.   MRN: [id number]  HPI  Here for follow up -phentermine begun early 12/2012 to help weight loss efforts  Past Medical History  Diagnosis Date  . Depression   . Migraine headache   . 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 Negative for unexpected weight change.  Respiratory: Negative for cough and shortness of breath.        Objective:   Physical Exam  BP 120/82  Pulse 81  Temp(Src) 98.2 F (36.8 C) (Oral)  Wt 209 lb 1.9 oz (94.856 kg)  BMI 35.88 kg/m2  SpO2 98% Wt Readings from Last 3 Encounters:  01/09/13 209 lb 1.9 oz (94.856 kg)  10/10/12 211 lb 12.8 oz (96.072 kg)  08/30/12 207 lb 12.8 oz (94.257 kg)   Constitutional: She is overweight, but appears well-developed and well-nourished. No distress.  Cardiovascular: Normal rate, regular rhythm and normal heart sounds.  No murmur heard. No BLE edema. Pulmonary/Chest: Effort normal and breath sounds normal. No respiratory distress. She has no wheezes.  Psychiatric: She has a normal mood and affect. Her behavior is normal. Judgment and thought content normal.   Lab Results  Component Value Date   WBC 5.9 10/10/2012   HGB 13.3 10/10/2012   HCT 39.0 10/10/2012   PLT 283.0 10/10/2012   GLUCOSE 83 02/29/2012   CHOL 152 02/29/2012   TRIG 94.0 02/29/2012   HDL 43.70 02/29/2012   LDLDIRECT 122.7 02/06/2011   LDLCALC 90 02/29/2012   ALT 30 10/10/2012   AST 31 10/10/2012   NA 140 02/29/2012   K 4.3 02/29/2012   CL 107 02/29/2012   CREATININE 0.6 02/29/2012   BUN 15 02/29/2012   CO2 26 02/29/2012   TSH 1.00 08/30/2012      Assessment & Plan:   See problem list. Medications and labs reviewed today.

## 2013-01-09 NOTE — Patient Instructions (Signed)
It was good to see you today. Will use Phentermine to help you reach your weight loss goals - today prescription for 2nd of 3 consecutive months provided - If side effects or other problems, please stop medication and call us. Please return in 1 month (nurse visit for weight check) before refill will be given Exercise to Lose Weight Exercise and a healthy diet may help you lose weight. Your doctor may suggest specific exercises. EXERCISE IDEAS AND TIPS  Choose low-cost things you enjoy doing, such as walking, bicycling, or exercising to workout videos.  Take stairs instead of the elevator.  Walk during your lunch break.  Park your car further away from work or school.  Go to a gym or an exercise class.  Start with 5 to 10 minutes of exercise each day. Build up to 30 minutes of exercise 4 to 6 days a week.  Wear shoes with good support and comfortable clothes.  Stretch before and after working out.  Work out until you breathe harder and your heart beats faster.  Drink extra water when you exercise.  Do not do so much that you hurt yourself, feel dizzy, or get very short of breath. Exercises that burn about 150 calories:  Running 1  miles in 15 minutes.  Playing volleyball for 45 to 60 minutes.  Washing and waxing a car for 45 to 60 minutes.  Playing touch football for 45 minutes.  Walking 1  miles in 35 minutes.  Pushing a stroller 1  miles in 30 minutes.  Playing basketball for 30 minutes.  Raking leaves for 30 minutes.  Bicycling 5 miles in 30 minutes.  Walking 2 miles in 30 minutes.  Dancing for 30 minutes.  Shoveling snow for 15 minutes.  Swimming laps for 20 minutes.  Walking up stairs for 15 minutes.  Bicycling 4 miles in 15 minutes.  Gardening for 30 to 45 minutes.  Jumping rope for 15 minutes.  Washing windows or floors for 45 to 60 minutes. Document Released: 07/11/2010 Document Revised: 08/31/2011 Document Reviewed: 07/11/2010 [facility]  Patient Information 2014 [place], [place]. Exercise to Lose Weight Exercise and a healthy diet may help you lose weight. Your doctor may suggest specific exercises. EXERCISE IDEAS AND TIPS  Choose low-cost things you enjoy doing, such as walking, bicycling, or exercising to workout videos.  Take stairs instead of the elevator.  Walk during your lunch break.  Park your car further away from work or school.  Go to a gym or an exercise class.  Start with 5 to 10 minutes of exercise each day. Build up to 30 minutes of exercise 4 to 6 days a week.  Wear shoes with good support and comfortable clothes.  Stretch before and after working out.  Work out until you breathe harder and your heart beats faster.  Drink extra water when you exercise.  Do not do so much that you hurt yourself, feel dizzy, or get very short of breath. Exercises that burn about 150 calories:  Running 1  miles in 15 minutes.  Playing volleyball for 45 to 60 minutes.  Washing and waxing a car for 45 to 60 minutes.  Playing touch football for 45 minutes.  Walking 1  miles in 35 minutes.  Pushing a stroller 1  miles in 30 minutes.  Playing basketball for 30 minutes.  Raking leaves for 30 minutes.  Bicycling 5 miles in 30 minutes.  Walking 2 miles in 30 minutes.  Dancing for 30 minutes.  Shoveling snow  for 15 minutes.  Swimming laps for 20 minutes.  Walking up stairs for 15 minutes.  Bicycling 4 miles in 15 minutes.  Gardening for 30 to 45 minutes.  Jumping rope for 15 minutes.  Washing windows or floors for 45 to 60 minutes. Document Released: 07/11/2010 Document Revised: 08/31/2011 Document Reviewed: 07/11/2010 [facility] Patient Information 2014 [place], [place].

## 2013-02-08 ENCOUNTER — Encounter: Payer: Self-pay | Admitting: Internal Medicine

## 2013-02-08 ENCOUNTER — Ambulatory Visit (INDEPENDENT_AMBULATORY_CARE_PROVIDER_SITE_OTHER): Payer: 59 | Admitting: Psychology

## 2013-02-08 DIAGNOSIS — F331 Major depressive disorder, recurrent, moderate: Secondary | ICD-10-CM

## 2013-02-08 MED ORDER — BUPROPION HCL ER (XL) 150 MG PO TB24: 150.0000 mg | ORAL_TABLET | ORAL | Status: DC

## 2013-02-08 MED ORDER — ESCITALOPRAM OXALATE 10 MG PO TABS: 10.0000 mg | ORAL_TABLET | Freq: Every day | ORAL | Status: DC

## 2013-02-10 ENCOUNTER — Ambulatory Visit (INDEPENDENT_AMBULATORY_CARE_PROVIDER_SITE_OTHER): Payer: 59 | Admitting: *Deleted

## 2013-02-10 VITALS — Wt 203.0 lb

## 2013-02-10 DIAGNOSIS — E669 Obesity, unspecified: Secondary | ICD-10-CM

## 2013-02-16 ENCOUNTER — Ambulatory Visit (INDEPENDENT_AMBULATORY_CARE_PROVIDER_SITE_OTHER): Payer: 59 | Admitting: Psychology

## 2013-02-16 DIAGNOSIS — F331 Major depressive disorder, recurrent, moderate: Secondary | ICD-10-CM

## 2013-02-21 ENCOUNTER — Ambulatory Visit (INDEPENDENT_AMBULATORY_CARE_PROVIDER_SITE_OTHER): Payer: 59 | Admitting: Psychology

## 2013-02-21 DIAGNOSIS — F331 Major depressive disorder, recurrent, moderate: Secondary | ICD-10-CM

## 2013-02-22 ENCOUNTER — Other Ambulatory Visit: Payer: Self-pay | Admitting: Internal Medicine

## 2013-02-22 NOTE — Telephone Encounter (Signed)
Called pt no answer [facility] rx ready for pick-up.../lmb 

## 2013-03-08 ENCOUNTER — Encounter: Payer: Self-pay | Admitting: Internal Medicine

## 2013-03-17 ENCOUNTER — Encounter: Payer: Self-pay | Admitting: Internal Medicine

## 2013-03-17 ENCOUNTER — Other Ambulatory Visit (INDEPENDENT_AMBULATORY_CARE_PROVIDER_SITE_OTHER): Payer: 59

## 2013-03-17 ENCOUNTER — Ambulatory Visit (INDEPENDENT_AMBULATORY_CARE_PROVIDER_SITE_OTHER): Payer: 59 | Admitting: Internal Medicine

## 2013-03-17 VITALS — BP 122/90 | HR 88 | Temp 97.6°F | Wt 193.0 lb

## 2013-03-17 DIAGNOSIS — Z0000 Encounter for general adult medical examination without abnormal findings: Secondary | ICD-10-CM

## 2013-03-17 DIAGNOSIS — J069 Acute upper respiratory infection, unspecified: Secondary | ICD-10-CM

## 2013-03-17 DIAGNOSIS — E039 Hypothyroidism, unspecified: Secondary | ICD-10-CM

## 2013-03-17 DIAGNOSIS — F32A Depression, unspecified: Secondary | ICD-10-CM

## 2013-03-17 DIAGNOSIS — E669 Obesity, unspecified: Secondary | ICD-10-CM

## 2013-03-17 DIAGNOSIS — F329 Major depressive disorder, single episode, unspecified: Secondary | ICD-10-CM

## 2013-03-17 LAB — LIPID PANEL
Cholesterol: 172 mg/dL (ref 0–200)
Total CHOL/HDL Ratio: 5
Triglycerides: 119 mg/dL (ref 0.0–149.0)

## 2013-03-17 LAB — CBC WITH DIFFERENTIAL/PLATELET
Basophils Absolute: 0 10*3/uL (ref 0.0–0.1)
Eosinophils Absolute: 0.1 10*3/uL (ref 0.0–0.7)
Lymphocytes Relative: 22 % (ref 12.0–46.0)
MCHC: 34.1 g/dL (ref 30.0–36.0)
Neutrophils Relative %: 67 % (ref 43.0–77.0)
Platelets: 352 10*3/uL (ref 150.0–400.0)
RDW: 13.9 % (ref 11.5–14.6)

## 2013-03-17 LAB — URINALYSIS, ROUTINE W REFLEX MICROSCOPIC
Hgb urine dipstick: NEGATIVE
Ketones, ur: NEGATIVE
Leukocytes, UA: NEGATIVE
Nitrite: NEGATIVE
Specific Gravity, Urine: 1.02 (ref 1.000–1.030)
Urobilinogen, UA: 1 (ref 0.0–1.0)
pH: 7.5 (ref 5.0–8.0)

## 2013-03-17 LAB — HEPATIC FUNCTION PANEL
AST: 26 U/L (ref 0–37)
Albumin: 4.3 g/dL (ref 3.5–5.2)
Alkaline Phosphatase: 45 U/L (ref 39–117)
Total Bilirubin: 0.7 mg/dL (ref 0.3–1.2)

## 2013-03-17 LAB — BASIC METABOLIC PANEL
BUN: 8 mg/dL (ref 6–23)
CO2: 27 mEq/L (ref 19–32)
Calcium: 9.4 mg/dL (ref 8.4–10.5)
Creatinine, Ser: 0.7 mg/dL (ref 0.4–1.2)
Glucose, Bld: 85 mg/dL (ref 70–99)

## 2013-03-17 LAB — TSH: TSH: 0.65 u[IU]/mL (ref 0.35–5.50)

## 2013-03-17 MED ORDER — PHENTERMINE HCL 37.5 MG PO CAPS: 37.5000 mg | ORAL_CAPSULE | ORAL | Status: DC

## 2013-03-17 MED ORDER — ALPRAZOLAM 0.25 MG PO TABS: ORAL_TABLET | ORAL | Status: DC

## 2013-03-17 MED ORDER — IPRATROPIUM BROMIDE 0.03 % NA SOLN: 2.0000 | Freq: Three times a day (TID) | NASAL | Status: DC

## 2013-03-17 NOTE — Assessment & Plan Note (Signed)
Hx same as teenager, exac postpartum 10/2010 intermittent behav health counseling ([facility]) -  started sertraline 03/2011 due to irritability; also overlap with anxiety prompting low dose xanax as needed -  Changed to SSRI to wellbutrin 07/2011, titrated 12/2011 -  Added lexapro 03/2012 - improved indep stopped all meds 09/2012, but then resumed summer 2014 due to symptoms relapse - reports doing well at this time Note she separated from spouse 08/2011, but reports amicable split

## 2013-03-17 NOTE — Progress Notes (Signed)
Subjective:    Patient ID: Cheryl Hampton, female    DOB: 06-16-1986, 27 y.o.   MRN: [id number]  HPI patient is here today for annual physical. Patient feels well overall.  Also reviewed chronic medical issues:  Migraine headache - Dx migraines age 37y -eval with MRI and LP then unremarkable recurrent symptoms after birth of 1st child ?precipitated by pregnancy or postpartum nuvaring > changed to [facility] 12/2010 without improvement Temporary improvement in symptoms with vicodin and antiemetics; no improvement with OTC meds Started Esgic prn 01/2011 - much improved  Depression - onset symptoms postpartum 10/2010 -  Manifest with irritability noted by family and coworkers started sertraline 03/2011, Symptoms initially improved -  Then changed to wellbutrin 07/2011 and prn xanax Also separated 08/2011 - move from [place] to [place] summer 2014 In counseling summer 2014 ([employer]) and added lexapro feels better overall about her situation  Hypothyroid - initially hyperthryoid - incidental dx 01/2011 cpx labs - Saw endo - 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 Depression   . Migraine headache   . 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 Alcohol abuse Maternal Uncle   . Pancreatic cancer Maternal Grandmother   . Stroke Other     grandparent  . Hypertension Other     parent., grandparent  . Diabetes Other     parent, grandparent   History  Substance Use Topics  . Smoking status: Never Smoker   . Smokeless tobacco: Not on file  . Alcohol Use: No   Review of Systems  Constitutional: Positive for fatigue. Negative for fever and unexpected weight change (on phentermine since spring 2014).  HENT: Positive for sore throat, rhinorrhea, postnasal  drip (x 10d) and sinus pressure. Negative for congestion, mouth sores, trouble swallowing and ear discharge.   Respiratory: Negative for cough and shortness of breath.   Cardiovascular: Negative for chest pain.  Gastrointestinal: Negative for nausea, abdominal pain, diarrhea and constipation.  Musculoskeletal: Negative for back pain and joint swelling.  Neurological: Negative for seizures and weakness.  Psychiatric/Behavioral: Positive for sleep disturbance and dysphoric mood. Negative for hallucinations and decreased concentration. The patient is not nervous/anxious.   No other specific complaints in a complete review of systems (except as listed in HPI above).     Objective:   Physical Exam BP 122/90  Pulse 88  Temp(Src) 97.6 F (36.4 C) (Oral)  Wt 193 lb (87.544 kg)  BMI 33.11 kg/m2  SpO2 97% Wt Readings from Last 3 Encounters:  03/17/13 193 lb (87.544 kg)  02/10/13 203 lb (92.08 kg)  01/09/13 209 lb 1.9 oz (94.856 kg)   Constitutional: She is overweight, but appears well-developed and well-nourished. No distress. HENT: Number somatic, atraumatic. Tympanic membranes clear bilaterally without effusion or erythema, no cerumen impaction. Nares with clear discarge. Dentition is good. Oropharynx clear  Neck: no palpable goiter, no LAD/JVD. Full range of motion, supple Cardiovascular: Normal rate, regular rhythm and normal heart sounds.  No murmur heard. No BLE edema. Pulmonary/Chest: Effort normal and breath sounds normal. No respiratory distress. She has no wheezes. Abdomen: Soft nontender nondistended, positive bowel sounds. No masses. No rebound or guarding GU: Defer to gynecology MSkel:no gross skeletal deformities.  Back: ful range of motion of  thoracic and lumbar spine. Non tender to palpation. Negative straight leg raise. DTR's are symmetrically intact. Sensation intact in all dermatomes of the lower extremities. Full strength to manual muscle testing. patient is able to heel toe  walk without difficulty and ambulates with non-antalgic gait. Neurologic: Awake alert oriented x4. Cranial nerves II through XII symmetrically intact. Moves all extremities well. Coordination, balance gait normal. Speech fluent without dysarthria  Psychiatric: She has a normal mood and affect. Her behavior is normal. Judgment and thought content normal.   Lab Results  Component Value Date   WBC 5.9 10/10/2012   HGB 13.3 10/10/2012   HCT 39.0 10/10/2012   PLT 283.0 10/10/2012   CHOL 152 02/29/2012   TRIG 94.0 02/29/2012   HDL 43.70 02/29/2012   LDLDIRECT 122.7 02/06/2011   ALT 30 10/10/2012   AST 31 10/10/2012   NA 140 02/29/2012   K 4.3 02/29/2012   CL 107 02/29/2012   CREATININE 0.6 02/29/2012   BUN 15 02/29/2012   CO2 26 02/29/2012   TSH 1.00 08/30/2012       Assessment & Plan:  CPX/v70.0 - Patient has been counseled on age-appropriate routine health concerns for screening and prevention. These are reviewed and up-to-date. Immunizations are up-to-date or declined. Labs ordered and reviewed.  URI - nasal atrovent and supportive care advised - No indication for bacterial infx at present  Also see problem list. Medications and labs reviewed today.

## 2013-03-17 NOTE — Patient Instructions (Addendum)
It was good to see you today. Health Maintenance reviewed - all recommended immunizations and age-appropriate screenings are up-to-date. Medications reviewed and updated, no changes recommended at this time. Atrovent nasal spray for URI symptoms - Your prescription(s) have been submitted to your pharmacy. Please take as directed and contact our office if you believe you are having problem(s) with the medication(s). Test(s) ordered today. Your results will be released to MyChart (or called to you) after review, usually within 72hours after test completion. If any changes need to be made, you will be notified at that same time. Please schedule followup in 6 months for depression and hypothyroid, call sooner if problems.  Health Maintenance, Females A healthy lifestyle and preventative care can promote health and wellness.  Maintain regular health, dental, and eye exams.  Eat a healthy diet. Foods like vegetables, fruits, whole grains, low-fat dairy products, and lean protein foods contain the nutrients you need without too many calories. Decrease your intake of foods high in solid fats, added sugars, and salt. Get information about a proper diet from your caregiver, if necessary.  Regular physical exercise is one of the most important things you can do for your health. Most adults should get at least 150 minutes of moderate-intensity exercise (any activity that increases your heart rate and causes you to sweat) each week. In addition, most adults need muscle-strengthening exercises on 2 or more days a week.   Maintain a healthy weight. The body mass index (BMI) is a screening tool to identify possible weight problems. It provides an estimate of body fat based on height and weight. Your caregiver can help determine your BMI, and can help you achieve or maintain a healthy weight. For adults 20 years and older:  A BMI below 18.5 is considered underweight.  A BMI of 18.5 to 24.9 is normal.  A BMI  of 25 to 29.9 is considered overweight.  A BMI of 30 and above is considered obese.  Maintain normal blood lipids and cholesterol by exercising and minimizing your intake of saturated fat. Eat a balanced diet with plenty of fruits and vegetables. Blood tests for lipids and cholesterol should begin at age 70 and be repeated every 5 years. If your lipid or cholesterol levels are high, you are over 50, or you are a high risk for heart disease, you may need your cholesterol levels checked more frequently.Ongoing high lipid and cholesterol levels should be treated with medicines if diet and exercise are not effective.  If you smoke, find out from your caregiver how to quit. If you do not use tobacco, do not start.  If you are pregnant, do not drink alcohol. If you are breastfeeding, be very cautious about drinking alcohol. If you are not pregnant and choose to drink alcohol, do not exceed 1 drink per day. One drink is considered to be 12 ounces (355 mL) of beer, 5 ounces (148 mL) of wine, or 1.5 ounces (44 mL) of liquor.  Avoid use of street drugs. Do not share needles with anyone. Ask for help if you need support or instructions about stopping the use of drugs.  High blood pressure causes heart disease and increases the risk of stroke. Blood pressure should be checked at least every 1 to 2 years. Ongoing high blood pressure should be treated with medicines, if weight loss and exercise are not effective.  If you are 70 to 26 years old, ask your caregiver if you should take aspirin to prevent strokes.  Diabetes screening  involves taking a blood sample to check your fasting blood sugar level. This should be done once every 3 years, after age 26, if you are within normal weight and without risk factors for diabetes. Testing should be considered at a younger age or be carried out more frequently if you are overweight and have at least 1 risk factor for diabetes.  Breast cancer screening is essential  preventative care for women. You should practice "breast self-awareness." This means understanding the normal appearance and feel of your breasts and may include breast self-examination. Any changes detected, no matter how small, should be reported to a caregiver. Women in their 71s and 30s should have a clinical breast exam (CBE) by a caregiver as part of a regular health exam every 1 to 3 years. After age 66, women should have a CBE every year. Starting at age 46, women should consider having a mammogram (breast X-ray) every year. Women who have a family history of breast cancer should talk to their caregiver about genetic screening. Women at a high risk of breast cancer should talk to their caregiver about having an MRI and a mammogram every year.  The Pap test is a screening test for cervical cancer. Women should have a Pap test starting at age 48. Between ages 31 and 56, Pap tests should be repeated every 2 years. Beginning at age 80, you should have a Pap test every 3 years as long as the past 3 Pap tests have been normal. If you had a hysterectomy for a problem that was not cancer or a condition that could lead to cancer, then you no longer need Pap tests. If you are between ages 102 and 8, and you have had normal Pap tests going back 10 years, you no longer need Pap tests. If you have had past treatment for cervical cancer or a condition that could lead to cancer, you need Pap tests and screening for cancer for at least 20 years after your treatment. If Pap tests have been discontinued, risk factors (such as a new sexual partner) need to be reassessed to determine if screening should be resumed. Some women have medical problems that increase the chance of getting cervical cancer. In these cases, your caregiver may recommend more frequent screening and Pap tests.  The human papillomavirus (HPV) test is an additional test that may be used for cervical cancer screening. The HPV test looks for the virus that  can cause the cell changes on the cervix. The cells collected during the Pap test can be tested for HPV. The HPV test could be used to screen women aged 34 years and older, and should be used in women of any age who have unclear Pap test results. After the age of 8, women should have HPV testing at the same frequency as a Pap test.  Colorectal cancer can be detected and often prevented. Most routine colorectal cancer screening begins at the age of 76 and continues through age 8. However, your caregiver may recommend screening at an earlier age if you have risk factors for colon cancer. On a yearly basis, your caregiver may provide home test kits to check for hidden blood in the stool. Use of a small camera at the end of a tube, to directly examine the colon (sigmoidoscopy or colonoscopy), can detect the earliest forms of colorectal cancer. Talk to your caregiver about this at age 68, when routine screening begins. Direct examination of the colon should be repeated every 5 to 10  years through age 57, unless early forms of pre-cancerous polyps or small growths are found.  Hepatitis C blood testing is recommended for all people born from [birth year] through 1964 and any individual with known risks for hepatitis C.  Practice safe sex. Use condoms and avoid high-risk sexual practices to reduce the spread of sexually transmitted infections (STIs). Sexually active women aged 54 and younger should be checked for Chlamydia, which is a common sexually transmitted infection. Older women with new or multiple partners should also be tested for Chlamydia. Testing for other STIs is recommended if you are sexually active and at increased risk.  Osteoporosis is a disease in which the bones lose minerals and strength with aging. This can result in serious bone fractures. The risk of osteoporosis can be identified using a bone density scan. Women ages 43 and over and women at risk for fractures or osteoporosis should discuss  screening with their caregivers. Ask your caregiver whether you should be taking a calcium supplement or vitamin D to reduce the rate of osteoporosis.  Menopause can be associated with physical symptoms and risks. Hormone replacement therapy is available to decrease symptoms and risks. You should talk to your caregiver about whether hormone replacement therapy is right for you.  Use sunscreen with a sun protection factor (SPF) of 30 or greater. Apply sunscreen liberally and repeatedly throughout the day. You should seek shade when your shadow is shorter than you. Protect yourself by wearing long sleeves, pants, a wide-brimmed hat, and sunglasses year round, whenever you are outdoors.  Notify your caregiver of new moles or changes in moles, especially if there is a change in shape or color. Also notify your caregiver if a mole is larger than the size of a pencil eraser.  Stay current with your immunizations. Document Released: 12/22/2010 Document Revised: 08/31/2011 Document Reviewed: 12/22/2010 [employer] Patient Information 2014 [place], [place].

## 2013-03-17 NOTE — Assessment & Plan Note (Signed)
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

## 2013-03-17 NOTE — Assessment & Plan Note (Signed)
Wt Readings from Last 3 Encounters:  03/17/13 193 lb (87.544 kg)  02/10/13 203 lb (92.08 kg)  01/09/13 209 lb 1.9 oz (94.856 kg)  Body mass index is 33.11 kg/(m^2). began Phentermine 11/2012 to help reach weight loss goals - Has done well : 25 pound weight loss reviewed Denies side effects or problems Continue same with encouragement on aerobic exercise to accompany diet changes for sustained weight loss

## 2013-03-27 ENCOUNTER — Telehealth: Payer: Self-pay | Admitting: Internal Medicine

## 2013-03-27 NOTE — Telephone Encounter (Signed)
Called Andrey Campanile back was told she was already gone for the day. Spoke with Arlys John wanted to know did we fax over scripts today. Inform pharmacist pt was seen on 03/17/13 for her CPX & was given hard copy on the day for her xanax & phentermine. We did not fax to them today...lmb

## 2013-03-27 NOTE — Telephone Encounter (Signed)
Andrey Campanile called from [facility] outpatient pharmacy stated that they just received fax of Xanax and Phentermine today, Andrey Campanile stated that the date on RX is 03/17/13. They need to verify the date for this med. Please call back.

## 2013-05-03 ENCOUNTER — Encounter: Payer: Self-pay | Admitting: Internal Medicine

## 2013-05-03 DIAGNOSIS — E039 Hypothyroidism, unspecified: Secondary | ICD-10-CM

## 2013-05-04 ENCOUNTER — Encounter: Payer: Self-pay | Admitting: Internal Medicine

## 2013-05-04 ENCOUNTER — Other Ambulatory Visit (INDEPENDENT_AMBULATORY_CARE_PROVIDER_SITE_OTHER): Payer: 59

## 2013-05-04 DIAGNOSIS — E039 Hypothyroidism, unspecified: Secondary | ICD-10-CM

## 2013-05-04 LAB — TSH: TSH: 1.42 u[IU]/mL (ref 0.35–5.50)

## 2013-05-04 LAB — T4, FREE: Free T4: 0.73 ng/dL (ref 0.60–1.60)

## 2013-05-22 ENCOUNTER — Other Ambulatory Visit: Payer: Self-pay | Admitting: Pulmonary Disease

## 2013-05-22 MED ORDER — AZITHROMYCIN 250 MG PO TABS: ORAL_TABLET | ORAL | Status: DC

## 2013-05-22 NOTE — Telephone Encounter (Signed)
Pt c/o sore throat, fever, chills, sweats over the weekend.  Lots of congestion and drainage.  zpak called to the pharmacy for the pt.

## 2013-05-31 ENCOUNTER — Other Ambulatory Visit: Payer: Self-pay | Admitting: *Deleted

## 2013-05-31 MED ORDER — ONDANSETRON HCL 4 MG PO TABS: ORAL_TABLET | ORAL | Status: DC

## 2013-06-12 ENCOUNTER — Encounter: Payer: Self-pay | Admitting: *Deleted

## 2013-06-12 ENCOUNTER — Encounter: Payer: Self-pay | Admitting: Internal Medicine

## 2013-06-12 MED ORDER — PHENTERMINE HCL 37.5 MG PO CAPS: 37.5000 mg | ORAL_CAPSULE | ORAL | Status: DC

## 2013-06-13 NOTE — Telephone Encounter (Signed)
Left rx up front for pick-up.../lmb 

## 2013-07-20 ENCOUNTER — Other Ambulatory Visit: Payer: Self-pay | Admitting: Internal Medicine

## 2013-07-20 MED ORDER — OSELTAMIVIR PHOSPHATE 75 MG PO CAPS: 75.0000 mg | ORAL_CAPSULE | Freq: Two times a day (BID) | ORAL | Status: DC

## 2013-08-02 ENCOUNTER — Encounter: Payer: Self-pay | Admitting: Internal Medicine

## 2013-08-02 ENCOUNTER — Encounter: Payer: Self-pay | Admitting: *Deleted

## 2013-08-07 ENCOUNTER — Encounter: Payer: Self-pay | Admitting: Internal Medicine

## 2013-08-07 ENCOUNTER — Ambulatory Visit (INDEPENDENT_AMBULATORY_CARE_PROVIDER_SITE_OTHER): Payer: 59 | Admitting: Internal Medicine

## 2013-08-07 VITALS — BP 122/82 | HR 75 | Temp 98.2°F | Wt 201.0 lb

## 2013-08-07 DIAGNOSIS — E669 Obesity, unspecified: Secondary | ICD-10-CM

## 2013-08-07 MED ORDER — NALTREXONE-BUPROPION HCL ER 8-90 MG PO TB12: ORAL_TABLET | ORAL | Status: DC

## 2013-08-07 NOTE — Assessment & Plan Note (Signed)
Wt Readings from Last 3 Encounters:  08/07/13 201 lb (91.173 kg)  03/17/13 193 lb (87.544 kg)  02/10/13 203 lb (92.08 kg)  Body mass index is 34.48 kg/(m^2). began Phentermine 11/2012 to help reach weight loss goals - Did well : 25 pound weight loss reviewed, but slow recurrence Discussed options for long term mgmt of obesity - pt chooses Contrave Erx done - we reviewed potential risk/benefit and possible side effects - pt understands and agrees to same  Continue to encourage on aerobic exercise to accompany diet changes for sustained weight loss

## 2013-08-07 NOTE — Progress Notes (Signed)
Pre-visit discussion using our clinic review tool. No additional management support is needed unless otherwise documented below in the visit note.  

## 2013-08-07 NOTE — Progress Notes (Signed)
Subjective:    Patient ID: Cheryl Hampton, female    DOB: 03-28-1986, 28 y.o.   MRN: [id number]  HPI  Patient here today to discuss weight loss plans.  She had gotten down to 175 pounds previously with phentermine.  Now back up to 201 pounds with increased fatigue.  She is under stress as a single mom.  She is aware of diet modification and need to exercise - she is working on these efforts currently.  TSH last checked 02/2013 and was normal.  Pt reports compliance with thyroid supplementation.   Past Medical History  Diagnosis Date  . Depression   . Migraine headache   . Hypothyroid 2012 dx    post partum hyperthyroid    Review of Systems  Constitutional: Negative for fever, chills, activity change and appetite change.  Respiratory: Negative.   Cardiovascular: Negative.   Gastrointestinal: Negative for nausea, vomiting, diarrhea and constipation.  Endocrine: Negative for cold intolerance, heat intolerance, polydipsia, polyphagia and polyuria.  Skin: Negative for rash and wound.  Neurological: Positive for headaches (chronic). Negative for dizziness, syncope and light-headedness.       Objective:   Physical Exam  Vitals reviewed. Constitutional: She is oriented to person, place, and time. She appears well-developed and well-nourished. No distress.  HENT:  Head: Normocephalic and atraumatic.  Neck: Normal range of motion. Neck supple. No thyromegaly present.  Cardiovascular: Normal rate, regular rhythm and normal heart sounds.   Pulmonary/Chest: Effort normal and breath sounds normal. No respiratory distress. She has no wheezes.  Musculoskeletal: Normal range of motion.  Lymphadenopathy:    She has no cervical adenopathy.  Neurological: She is alert and oriented to person, place, and time.  Skin: Skin is warm and dry. No rash noted. She is not diaphoretic.  Psychiatric: She has a normal mood and affect. Her behavior is normal. Judgment and thought content normal.     Wt  Readings from Last 3 Encounters:  08/07/13 201 lb (91.173 kg)  03/17/13 193 lb (87.544 kg)  02/10/13 203 lb (92.08 kg)   BP Readings from Last 3 Encounters:  08/07/13 122/82  03/17/13 122/90  01/09/13 120/82   Lab Results  Component Value Date   WBC 6.1 03/17/2013   HGB 13.7 03/17/2013   HCT 40.3 03/17/2013   PLT 352.0 03/17/2013   GLUCOSE 85 03/17/2013   CHOL 172 03/17/2013   TRIG 119.0 03/17/2013   HDL 37.00* 03/17/2013   LDLDIRECT 122.7 02/06/2011   LDLCALC 111* 03/17/2013   ALT 41* 03/17/2013   AST 26 03/17/2013   NA 140 03/17/2013   K 4.2 03/17/2013   CL 107 03/17/2013   CREATININE 0.7 03/17/2013   BUN 8 03/17/2013   CO2 27 03/17/2013   TSH 1.42 05/04/2013         Assessment & Plan:   Problem List Items Addressed This Visit   Obesity, unspecified - Primary      Wt Readings from Last 3 Encounters:  08/07/13 201 lb (91.173 kg)  03/17/13 193 lb (87.544 kg)  02/10/13 203 lb (92.08 kg)  Body mass index is 34.48 kg/(m^2). began Phentermine 11/2012 to help reach weight loss goals - Did well : 25 pound weight loss reviewed, but slow recurrence Discussed options for long term mgmt of obesity - pt chooses Contrave Erx done - we reviewed potential risk/benefit and possible side effects - pt understands and agrees to same  Continue to encourage on aerobic exercise to accompany diet changes for sustained weight loss  Relevant Medications      Naltrexone-Bupropion HCl ER 8-90 MG TB12

## 2013-08-07 NOTE — Patient Instructions (Addendum)
It was good to see you today.  We have reviewed your prior records including labs and tests today  Begin Contrave for medication assistance with your weight loss goals - prescription provided to you today to take your pharmacy along with copay card from company  followup for nurse visit in one month to ensure ongoing weight loss and will provide another refill at that time  Followup in 3 months with me to review weight changes and medication, please call sooner if problems  Exercise to Lose Weight Exercise and a healthy diet may help you lose weight. Your doctor may suggest specific exercises. EXERCISE IDEAS AND TIPS  Choose low-cost things you enjoy doing, such as walking, bicycling, or exercising to workout videos.  Take stairs instead of the elevator.  Walk during your lunch break.  Park your car further away from work or school.  Go to a gym or an exercise class.  Start with 5 to 10 minutes of exercise each day. Build up to 30 minutes of exercise 4 to 6 days a week.  Wear shoes with good support and comfortable clothes.  Stretch before and after working out.  Work out until you breathe harder and your heart beats faster.  Drink extra water when you exercise.  Do not do so much that you hurt yourself, feel dizzy, or get very short of breath. Exercises that burn about 150 calories:  Running 1  miles in 15 minutes.  Playing volleyball for 45 to 60 minutes.  Washing and waxing a car for 45 to 60 minutes.  Playing touch football for 45 minutes.  Walking 1  miles in 35 minutes.  Pushing a stroller 1  miles in 30 minutes.  Playing basketball for 30 minutes.  Raking leaves for 30 minutes.  Bicycling 5 miles in 30 minutes.  Walking 2 miles in 30 minutes.  Dancing for 30 minutes.  Shoveling snow for 15 minutes.  Swimming laps for 20 minutes.  Walking up stairs for 15 minutes.  Bicycling 4 miles in 15 minutes.  Gardening for 30 to 45 minutes.  Jumping  rope for 15 minutes.  Washing windows or floors for 45 to 60 minutes. Document Released: 07/11/2010 Document Revised: 08/31/2011 Document Reviewed: 07/11/2010 [facility] Patient Information 2014 [place], [place].

## 2013-08-08 MED ORDER — LORCASERIN HCL 10 MG PO TABS: 10.0000 mg | ORAL_TABLET | Freq: Two times a day (BID) | ORAL | Status: DC

## 2013-08-08 NOTE — Telephone Encounter (Signed)
Lucy -Please have [facility] rx available for pt to pick up - I have signed and left on your desk thanks

## 2013-08-09 ENCOUNTER — Encounter: Payer: Self-pay | Admitting: Internal Medicine

## 2013-08-09 NOTE — Telephone Encounter (Signed)
MD printed Cheryl Hampton place up front for pick-up...Cheryl Hampton

## 2013-08-10 ENCOUNTER — Other Ambulatory Visit: Payer: Self-pay | Admitting: Internal Medicine

## 2013-08-10 MED ORDER — BUTALBITAL-APAP-CAFFEINE 50-325-40 MG PO TABS: 1.0000 | ORAL_TABLET | ORAL | Status: DC | PRN

## 2013-08-11 ENCOUNTER — Encounter: Payer: Self-pay | Admitting: Internal Medicine

## 2013-08-14 ENCOUNTER — Other Ambulatory Visit: Payer: Self-pay | Admitting: Internal Medicine

## 2013-08-16 ENCOUNTER — Telehealth: Payer: Self-pay | Admitting: *Deleted

## 2013-08-16 ENCOUNTER — Encounter: Payer: Self-pay | Admitting: Internal Medicine

## 2013-08-16 ENCOUNTER — Ambulatory Visit: Payer: 59 | Admitting: Psychology

## 2013-08-16 NOTE — Telephone Encounter (Signed)
Faxed script back to [place]...lmb

## 2013-08-16 NOTE — Telephone Encounter (Signed)
Patient phoned requesting alprazalam refill.  Last OV with PCP 08/07/13 and med last ordered yesterday.  Lucy handled.

## 2013-08-21 ENCOUNTER — Encounter (HOSPITAL_COMMUNITY): Payer: Self-pay | Admitting: Emergency Medicine

## 2013-08-21 ENCOUNTER — Emergency Department (HOSPITAL_COMMUNITY)
Admission: EM | Admit: 2013-08-21 | Discharge: 2013-08-21 | Disposition: A | Discharge status: Home / Self Care | Payer: 59 | Attending: Emergency Medicine | Admitting: Emergency Medicine

## 2013-08-21 ENCOUNTER — Emergency Department (HOSPITAL_COMMUNITY): Payer: 59

## 2013-08-21 ENCOUNTER — Ambulatory Visit (INDEPENDENT_AMBULATORY_CARE_PROVIDER_SITE_OTHER): Payer: 59 | Admitting: Psychology

## 2013-08-21 DIAGNOSIS — H53149 Visual discomfort, unspecified: Secondary | ICD-10-CM | POA: Insufficient documentation

## 2013-08-21 DIAGNOSIS — S0990XA Unspecified injury of head, initial encounter: Secondary | ICD-10-CM | POA: Insufficient documentation

## 2013-08-21 DIAGNOSIS — E039 Hypothyroidism, unspecified: Secondary | ICD-10-CM | POA: Insufficient documentation

## 2013-08-21 DIAGNOSIS — F3289 Other specified depressive episodes: Secondary | ICD-10-CM | POA: Insufficient documentation

## 2013-08-21 DIAGNOSIS — Y929 Unspecified place or not applicable: Secondary | ICD-10-CM | POA: Insufficient documentation

## 2013-08-21 DIAGNOSIS — F331 Major depressive disorder, recurrent, moderate: Secondary | ICD-10-CM

## 2013-08-21 DIAGNOSIS — Y9302 Activity, running: Secondary | ICD-10-CM | POA: Insufficient documentation

## 2013-08-21 DIAGNOSIS — W1809XA Striking against other object with subsequent fall, initial encounter: Secondary | ICD-10-CM | POA: Insufficient documentation

## 2013-08-21 DIAGNOSIS — Z79899 Other long term (current) drug therapy: Secondary | ICD-10-CM | POA: Insufficient documentation

## 2013-08-21 DIAGNOSIS — R112 Nausea with vomiting, unspecified: Secondary | ICD-10-CM | POA: Insufficient documentation

## 2013-08-21 DIAGNOSIS — IMO0002 Reserved for concepts with insufficient information to code with codable children: Secondary | ICD-10-CM | POA: Insufficient documentation

## 2013-08-21 DIAGNOSIS — Z8679 Personal history of other diseases of the circulatory system: Secondary | ICD-10-CM | POA: Insufficient documentation

## 2013-08-21 DIAGNOSIS — S199XXA Unspecified injury of neck, initial encounter: Secondary | ICD-10-CM

## 2013-08-21 DIAGNOSIS — S0993XA Unspecified injury of face, initial encounter: Secondary | ICD-10-CM | POA: Insufficient documentation

## 2013-08-21 DIAGNOSIS — F329 Major depressive disorder, single episode, unspecified: Secondary | ICD-10-CM | POA: Insufficient documentation

## 2013-08-21 DIAGNOSIS — R42 Dizziness and giddiness: Secondary | ICD-10-CM | POA: Insufficient documentation

## 2013-08-21 NOTE — Discharge Instructions (Signed)
Ibuprofen or tylenol for pain. Follow up with primary care doctor for recheck.   Head Injury, Adult You have received a head injury. It does not appear serious at this time. Headaches and vomiting are common following head injury. It should be easy to awaken from sleeping. Sometimes it is necessary for you to stay in the emergency department for a while for observation. Sometimes admission to the hospital may be needed. After injuries such as yours, most problems occur within the first 24 hours, but side effects may occur up to 7 10 days after the injury. It is important for you to carefully monitor your condition and contact your health care provider or seek immediate medical care if there is a change in your condition. WHAT ARE THE TYPES OF HEAD INJURIES? Head injuries can be as minor as a bump. Some head injuries can be more severe. More severe head injuries include:  A jarring injury to the brain (concussion).  A bruise of the brain (contusion). This mean there is bleeding in the brain that can cause swelling.  A cracked skull (skull fracture).  Bleeding in the brain that collects, clots, and forms a bump (hematoma). WHAT CAUSES A HEAD INJURY? A serious head injury is most likely to happen to someone who is in a car wreck and is not wearing a seat belt. Other causes of major head injuries include bicycle or motorcycle accidents, sports injuries, and falls. HOW ARE HEAD INJURIES DIAGNOSED? A complete history of the event leading to the injury and your current symptoms will be helpful in diagnosing head injuries. Many times, pictures of the brain, such as CT or MRI are needed to see the extent of the injury. Often, an overnight hospital stay is necessary for observation.  WHEN SHOULD I SEEK IMMEDIATE MEDICAL CARE?  You should get help right away if:  You have confusion or drowsiness.  You feel sick to your stomach (nauseous) or have continued, forceful vomiting.  You have dizziness or  unsteadiness that is getting worse.  You have severe, continued headaches not relieved by medicine. Only take over-the-counter or prescription medicines for pain, fever, or discomfort as directed by your health care provider.  You do not have normal function of the arms or legs or are unable to walk.  You notice changes in the black spots in the center of the colored part of your eye (pupil).  You have a clear or bloody fluid coming from your nose or ears.  You have a loss of vision. During the next 24 hours after the injury, you must stay with someone who can watch you for the warning signs. This person should contact local emergency services (911 in the [place] if you have seizures, you become unconscious, or you are unable to wake up. HOW CAN I PREVENT A HEAD INJURY IN THE FUTURE? The most important factor for preventing major head injuries is avoiding motor vehicle accidents. To minimize the potential for damage to your head, it is crucial to wear seat belts while riding in motor vehicles. Wearing helmets while bike riding and playing collision sports (like football) is also helpful. Also, avoiding dangerous activities around the house will further help reduce your risk of head injury.  WHEN CAN I RETURN TO NORMAL ACTIVITIES AND ATHLETICS? You should be reevaluated by your health care provider before returning to these activities. If you have any of the following symptoms, you should not return to activities or contact sports until 1 week after the symptoms  have stopped:  Persistent headache.  Dizziness or vertigo.  Poor attention and concentration.  Confusion.  Memory problems.  Nausea or vomiting.  Fatigue or tire easily.  Irritability.  Intolerant of bright lights or loud noises.  Anxiety or depression.  Disturbed sleep. MAKE SURE YOU:   Understand these instructions.  Will watch your condition.  Will get help right away if you are not doing well or get  worse. Document Released: 06/08/2005 Document Revised: 03/29/2013 Document Reviewed: 02/13/2013 [facility] Patient Information 2014 [employer].

## 2013-08-21 NOTE — ED Notes (Signed)
Pt sts yesterday she fell backwards hitting the back of her head, denies LOC, sts is nauseous, vomited yesterday. Today pt sts excruciating headache, swollen neck, nausea, upper back pain.

## 2013-08-21 NOTE — ED Provider Notes (Signed)
CSN: [id number]     Arrival date & time 08/21/13  0998 History   First MD Initiated Contact with Patient 08/21/13 [phone]     Chief Complaint  Patient presents with  . Fall  . Headache     (Consider location/radiation/quality/duration/timing/severity/associated sxs/prior Treatment) HPI Cheryl Hampton is a 28 y.o. female who presents to ED with complaint of headache after hitting her head yesterday. Patient states she was running around on a hardwood floor in her socks, states she slipped, and fell backwards onto her back hitting her head on the ground. Patient states that she hit her head pretty hard. She reports possible brief loss of consciousness, she states everything around her went black. She reports instant severe headache associated with nausea and dizziness. Patient states she applied ice on it. She states later that day she has had persistent nausea, photosensitivity, one episode of vomiting. States this morning her nausea and vomiting as well as dizziness improved however her headache continues and states it's just as severe as yesterday. She states she took some ibuprofen yesterday with no relief, she did not take any medications this morning. She denies taking any blood thinning medications. She denies any prior significant head injuries. She does have a history of migraines. Patient also reports pain in her neck and her back. She states in her neck as all are and worse with movement. No numbness or weakness in extremities. No confusion or memory loss.  Past Medical History  Diagnosis Date  . Depression   . Migraine headache   . Hypothyroid 2012 dx    post partum hyperthyroid   Past Surgical History  Procedure Laterality Date  . Removal (r) fallopian tube  09/2009   Family History  Problem Relation Age of Onset  . Alcohol abuse Maternal Uncle   . Pancreatic cancer Maternal Grandmother   . Stroke Other     grandparent  . Hypertension Other     parent., grandparent  . Diabetes  Other     parent, grandparent   History  Substance Use Topics  . Smoking status: Never Smoker   . Smokeless tobacco: Not on file  . Alcohol Use: No   OB History   Grav Para Term Preterm Abortions TAB SAB Ect Mult Living                 Review of Systems  Constitutional: Negative for fever and chills.  Eyes: Positive for photophobia.  Respiratory: Negative for cough, chest tightness and shortness of breath.   Cardiovascular: Negative for chest pain, palpitations and leg swelling.  Gastrointestinal: Positive for nausea and vomiting. Negative for abdominal pain and diarrhea.  Genitourinary: Negative for dysuria, flank pain and pelvic pain.  Musculoskeletal: Positive for myalgias and neck pain. Negative for arthralgias.  Skin: Negative for rash.  Neurological: Positive for dizziness and headaches. Negative for weakness and numbness.  All other systems reviewed and are negative.      Allergies  Review of patient's allergies indicates no known allergies.  Home Medications   Current Outpatient Rx  Name  Route  Sig  Dispense  Refill  . ALPRAZolam (XANAX) 0.25 MG tablet      TAKE 1 TABLET BY MOUTH THREE TIMES A DAY AS NEEDED FOR SLEEP   90 tablet   2   . buPROPion (WELLBUTRIN XL) 150 MG 24 hr tablet   Oral   Take 150 mg by mouth daily.         . butalbital-acetaminophen-caffeine (FIORICET, [place])  50-325-40 MG per tablet   Oral   Take 1 tablet by mouth every 4 (four) hours as needed for headache.   90 tablet   0   . escitalopram (LEXAPRO) 10 MG tablet   Oral   Take 1 tablet (10 mg total) by mouth daily.   90 tablet   1   . ibuprofen (ADVIL,MOTRIN) 200 MG tablet   Oral   Take 200 mg by mouth every 6 (six) hours as needed.         Marland Kitchen levothyroxine (SYNTHROID, LEVOTHROID) 50 MCG tablet      TAKE 1 TABLET BY MOUTH ONCE DAILY   30 tablet   5   . ondansetron (ZOFRAN) 4 MG tablet      TAKE 1 TABLET BY MOUTH EVERY 8 HOURS AS NEEDED FOR NAUSEA   30 tablet    0    BP 129/82  Pulse 72  Temp(Src) 98.3 F (36.8 C) (Oral)  Resp 18  SpO2 99%  LMP 08/14/2013 Physical Exam  Nursing note and vitals reviewed. Constitutional: She appears well-developed and well-nourished. No distress.  HENT:  Head: Normocephalic.  Eyes: Conjunctivae are normal.  Neck: Neck supple.  Midline and paravertebral tenderness over cervical spine. Pain with range of motion in all directions. Following emotion of the neck.  Cardiovascular: Normal rate, regular rhythm and normal heart sounds.   Pulmonary/Chest: Effort normal and breath sounds normal. No respiratory distress. She has no wheezes. She has no rales.  Abdominal: Soft. Bowel sounds are normal. There is no tenderness.  Musculoskeletal: She exhibits no edema.  Midline thoracic and lumbar spine tenderness. No paravertebral tenderness. Full range of motion of bilateral upper and lower extremities with no pain.  Neurological: She is alert.  5/5 and equal upper and lower extremity strength bilaterally. Equal grip strength bilaterally. Normal finger to nose and heel to shin. No pronator drift.   Skin: Skin is warm and dry.  Psychiatric: She has a normal mood and affect. Her behavior is normal.    ED Course  Procedures (including critical care time) Labs Review Labs Reviewed - No data to display Imaging Review Ct Head Wo Contrast  08/21/2013   CLINICAL DATA:  Fall, headache  EXAM: CT HEAD WITHOUT CONTRAST  CT CERVICAL SPINE WITHOUT CONTRAST  TECHNIQUE: Multidetector CT imaging of the head and cervical spine was performed following the standard protocol without intravenous contrast. Multiplanar CT image reconstructions of the cervical spine were also generated.  COMPARISON:  None.  FINDINGS: CT HEAD FINDINGS  There is no evidence of mass effect, midline shift or extra-axial fluid collections. There is no evidence of a space-occupying lesion or intracranial hemorrhage. There is no evidence of a cortical-based area of acute  infarction.  The ventricles and sulci are appropriate for the patient's age. The basal cisterns are patent.  Visualized portions of the orbits are unremarkable. The visualized portions of the paranasal sinuses and mastoid air cells are unremarkable.  The osseous structures are unremarkable.  CT CERVICAL SPINE FINDINGS  The alignment is anatomic. The vertebral body heights are maintained. There is loss the normal cervical lordosis with mild reversal. There is no acute fracture. There is no static listhesis. The prevertebral soft tissues are normal. The intraspinal soft tissues are not fully imaged on this examination due to poor soft tissue contrast, but there is no gross soft tissue abnormality.  The disc spaces are maintained.  The visualized portions of the lung apices demonstrate no focal abnormality.  IMPRESSION: 1.  No acute intracranial pathology. 2. No acute osseous injury of the cervical spine.   Electronically Signed   By: Kathreen Devoid   On: 08/21/2013 10:31   Ct Cervical Spine Wo Contrast  08/21/2013   CLINICAL DATA:  Fall, headache  EXAM: CT HEAD WITHOUT CONTRAST  CT CERVICAL SPINE WITHOUT CONTRAST  TECHNIQUE: Multidetector CT imaging of the head and cervical spine was performed following the standard protocol without intravenous contrast. Multiplanar CT image reconstructions of the cervical spine were also generated.  COMPARISON:  None.  FINDINGS: CT HEAD FINDINGS  There is no evidence of mass effect, midline shift or extra-axial fluid collections. There is no evidence of a space-occupying lesion or intracranial hemorrhage. There is no evidence of a cortical-based area of acute infarction.  The ventricles and sulci are appropriate for the patient's age. The basal cisterns are patent.  Visualized portions of the orbits are unremarkable. The visualized portions of the paranasal sinuses and mastoid air cells are unremarkable.  The osseous structures are unremarkable.  CT CERVICAL SPINE FINDINGS  The  alignment is anatomic. The vertebral body heights are maintained. There is loss the normal cervical lordosis with mild reversal. There is no acute fracture. There is no static listhesis. The prevertebral soft tissues are normal. The intraspinal soft tissues are not fully imaged on this examination due to poor soft tissue contrast, but there is no gross soft tissue abnormality.  The disc spaces are maintained.  The visualized portions of the lung apices demonstrate no focal abnormality.  IMPRESSION: 1. No acute intracranial pathology. 2. No acute osseous injury of the cervical spine.   Electronically Signed   By: Kathreen Devoid   On: 08/21/2013 10:31     EKG Interpretation None      MDM   Final diagnoses:  Head injury    In a patient with a head injury yesterday. Possible LOC, severe headache, nausea, vomiting, dizziness. Also pain in her neck and back. Given her persistent symptoms or not improving, will get CT head and cervical spine. Patient does not want any pain medications at this time.  11:04 AM CT of the head and cervical spine are negative. Patient appears relief and states her pain is actually better now that she noticed nothing bad going on. Will discharge her home with ibuprofen and Tylenol for pain. Close followup and was instructed to return if her symptoms are worsening.  Pt neurovascularly intact, no physical exam findings.   Filed Vitals:   08/21/13 0853  BP: 129/82  Pulse: 72  Temp: 98.3 F (36.8 C)  TempSrc: Oral  Resp: 18  SpO2: 99%      Philip Eckersley A Charlita Brian, PA-C 08/21/13 1621

## 2013-08-24 NOTE — ED Provider Notes (Signed)
Medical screening examination/treatment/procedure(s) were performed by non-physician practitioner and as supervising physician I was immediately available for consultation/collaboration.   EKG Interpretation None        Orpah Greek, MD 08/24/13 [phone]

## 2013-08-26 ENCOUNTER — Encounter: Payer: 59 | Attending: Internal Medicine | Admitting: Dietician

## 2013-08-26 DIAGNOSIS — E669 Obesity, unspecified: Secondary | ICD-10-CM

## 2013-08-26 DIAGNOSIS — Z713 Dietary counseling and surveillance: Secondary | ICD-10-CM | POA: Insufficient documentation

## 2013-08-26 DIAGNOSIS — E119 Type 2 diabetes mellitus without complications: Secondary | ICD-10-CM | POA: Insufficient documentation

## 2013-08-28 ENCOUNTER — Ambulatory Visit: Payer: 59 | Admitting: Psychology

## 2013-08-28 NOTE — Patient Instructions (Signed)
Patient to set specific, measurable, attainable, realistic, and time-oriented goals.  

## 2013-08-28 NOTE — Progress Notes (Signed)
   HPI Review of Systems     Physical Exam        Weight Management Class:  Appt start time: 1000   End time:  1100.  Patient was seen on 08/26/2013 for Weight Management Education at [facility].   Weight today: unknown   The following the learning objectives were met by the patient during this course:  Identify healthy eating/lifestyle behaviors for weight loss  Be familiar with the different food groups and appropriate serving sizes  Learn healthy meal planning  State the appropriate amount of weight to lose weekly  Identify 1-2 goals to begin to lose weight   Follow-Up Plan: Patient to set specific, measurable, attainable, realistic, and time-oriented goals for weight loss.      

## 2013-08-31 ENCOUNTER — Ambulatory Visit (INDEPENDENT_AMBULATORY_CARE_PROVIDER_SITE_OTHER): Payer: 59 | Admitting: Psychology

## 2013-08-31 DIAGNOSIS — F331 Major depressive disorder, recurrent, moderate: Secondary | ICD-10-CM

## 2013-09-06 ENCOUNTER — Ambulatory Visit: Payer: 59 | Admitting: Psychology

## 2013-09-11 ENCOUNTER — Encounter: Payer: Self-pay | Admitting: Internal Medicine

## 2013-09-11 MED ORDER — PHENTERMINE HCL 37.5 MG PO CAPS: 37.5000 mg | ORAL_CAPSULE | ORAL | Status: DC

## 2013-09-11 NOTE — Telephone Encounter (Signed)
Place rx up front for pick-up.../lmb 

## 2013-09-12 ENCOUNTER — Telehealth: Payer: Self-pay | Admitting: Internal Medicine

## 2013-09-12 MED ORDER — BUTALBITAL-APAP-CAFFEINE 50-325-40 MG PO TABS: 1.0000 | ORAL_TABLET | ORAL | Status: DC | PRN

## 2013-09-13 NOTE — Telephone Encounter (Signed)
Rx was faxed back to pharmacy by [facility]...Johny Chess

## 2013-09-28 ENCOUNTER — Telehealth: Payer: Self-pay | Admitting: Internal Medicine

## 2013-09-28 MED ORDER — TRIAMCINOLONE ACETONIDE 0.1 % MT PSTE: 1.0000 "application " | PASTE | Freq: Two times a day (BID) | OROMUCOSAL | Status: DC

## 2013-09-28 NOTE — Telephone Encounter (Signed)
Pt spoke with CY and he suggests we send Kenalog with Orabase #1 tube apply BID prn with prn refills. Pt is aware and requests the Rx be sent to [place] [facility].

## 2013-10-06 ENCOUNTER — Other Ambulatory Visit: Payer: Self-pay | Admitting: Internal Medicine

## 2013-10-19 ENCOUNTER — Encounter: Payer: Self-pay | Admitting: Internal Medicine

## 2013-12-05 ENCOUNTER — Other Ambulatory Visit: Payer: Self-pay | Admitting: Pulmonary Disease

## 2013-12-05 DIAGNOSIS — R110 Nausea: Secondary | ICD-10-CM

## 2013-12-06 ENCOUNTER — Other Ambulatory Visit (INDEPENDENT_AMBULATORY_CARE_PROVIDER_SITE_OTHER): Payer: 59

## 2013-12-06 DIAGNOSIS — R110 Nausea: Secondary | ICD-10-CM

## 2013-12-06 LAB — HCG, QUANTITATIVE, PREGNANCY: hCG, Beta Chain, Quant, S: 0.64 m[IU]/mL

## 2013-12-08 ENCOUNTER — Other Ambulatory Visit: Payer: 59

## 2013-12-08 ENCOUNTER — Other Ambulatory Visit: Payer: Self-pay | Admitting: Pulmonary Disease

## 2013-12-08 DIAGNOSIS — R110 Nausea: Secondary | ICD-10-CM

## 2013-12-08 LAB — HCG, QUANTITATIVE, PREGNANCY: hCG, Beta Chain, Quant, S: <2 m[IU]/mL

## 2014-01-02 ENCOUNTER — Ambulatory Visit (INDEPENDENT_AMBULATORY_CARE_PROVIDER_SITE_OTHER)
Admission: RE | Admit: 2014-01-02 | Discharge: 2014-01-02 | Disposition: A | Discharge status: Home / Self Care | Payer: 59 | Source: Ambulatory Visit | Attending: Adult Health | Admitting: Adult Health

## 2014-01-02 ENCOUNTER — Telehealth: Payer: Self-pay | Admitting: Adult Health

## 2014-01-02 DIAGNOSIS — W108XXA Fall (on) (from) other stairs and steps, initial encounter: Secondary | ICD-10-CM

## 2014-01-02 DIAGNOSIS — M25559 Pain in unspecified hip: Secondary | ICD-10-CM

## 2014-01-02 NOTE — Telephone Encounter (Signed)
Spoke with patient who reported she did contact her PCP but is unable to be seen today and was asked if TP could see pt or order xrays.  Pt stated that she fell down a flight of stairs at home yesterday evening (16 steps into the basement) and is in a lot of pain in the right buttock and hip with bruising and swelling.  Unsure if tail-bone is fractured.  Per TP: okay for xray of coccyx and sacrum.  Pt is aware of TP's recs and that xray has been ordered STAT.  Will forward message back to my inbox to follow up on results.

## 2014-01-03 NOTE — Telephone Encounter (Signed)
Pt aware of xray results  Nothing further needed; will sign off.

## 2014-01-09 ENCOUNTER — Other Ambulatory Visit: Payer: Self-pay | Admitting: Internal Medicine

## 2014-02-20 ENCOUNTER — Other Ambulatory Visit: Payer: Self-pay | Admitting: Internal Medicine

## 2014-02-23 ENCOUNTER — Encounter: Payer: Self-pay | Admitting: Internal Medicine

## 2014-02-23 ENCOUNTER — Other Ambulatory Visit: Payer: Self-pay | Admitting: Internal Medicine

## 2014-02-27 ENCOUNTER — Encounter: Payer: Self-pay | Admitting: Internal Medicine

## 2014-02-27 ENCOUNTER — Other Ambulatory Visit: Payer: Self-pay

## 2014-02-27 MED ORDER — ALPRAZOLAM 0.25 MG PO TABS: ORAL_TABLET | ORAL | Status: DC

## 2014-03-19 ENCOUNTER — Encounter: Payer: Self-pay | Admitting: Internal Medicine

## 2014-03-19 ENCOUNTER — Ambulatory Visit (INDEPENDENT_AMBULATORY_CARE_PROVIDER_SITE_OTHER): Payer: 59 | Admitting: Internal Medicine

## 2014-03-19 ENCOUNTER — Other Ambulatory Visit (INDEPENDENT_AMBULATORY_CARE_PROVIDER_SITE_OTHER): Payer: 59

## 2014-03-19 VITALS — BP 128/82 | HR 78 | Temp 97.9°F | Ht 64.0 in | Wt 220.0 lb

## 2014-03-19 DIAGNOSIS — E039 Hypothyroidism, unspecified: Secondary | ICD-10-CM

## 2014-03-19 DIAGNOSIS — E669 Obesity, unspecified: Secondary | ICD-10-CM

## 2014-03-19 DIAGNOSIS — Z0000 Encounter for general adult medical examination without abnormal findings: Secondary | ICD-10-CM

## 2014-03-19 LAB — CBC WITH DIFFERENTIAL/PLATELET
Basophils Absolute: 0 10*3/uL (ref 0.0–0.1)
Basophils Relative: 0.4 % (ref 0.0–3.0)
Eosinophils Absolute: 0 10*3/uL (ref 0.0–0.7)
Eosinophils Relative: 0.8 % (ref 0.0–5.0)
HCT: 38.4 % (ref 36.0–46.0)
Hemoglobin: 13 g/dL (ref 12.0–15.0)
Lymphocytes Relative: 31.9 % (ref 12.0–46.0)
Lymphs Abs: 1.9 10*3/uL (ref 0.7–4.0)
MCHC: 33.7 g/dL (ref 30.0–36.0)
MCV: 90 fl (ref 78.0–100.0)
Monocytes Absolute: 0.4 10*3/uL (ref 0.1–1.0)
Monocytes Relative: 6.6 % (ref 3.0–12.0)
Neutro Abs: 3.5 10*3/uL (ref 1.4–7.7)
Neutrophils Relative %: 60.3 % (ref 43.0–77.0)
Platelets: 345 10*3/uL (ref 150.0–400.0)
RBC: 4.27 Mil/uL (ref 3.87–5.11)
RDW: 13.7 % (ref 11.5–15.5)
WBC: 5.8 10*3/uL (ref 4.0–10.5)

## 2014-03-19 LAB — URINALYSIS, ROUTINE W REFLEX MICROSCOPIC
BILIRUBIN URINE: NEGATIVE
HGB URINE DIPSTICK: NEGATIVE
KETONES UR: NEGATIVE
NITRITE: NEGATIVE
PH: 7 (ref 5.0–8.0)
RBC / HPF: NONE SEEN (ref 0–?)
Specific Gravity, Urine: 1.015 (ref 1.000–1.030)
Total Protein, Urine: NEGATIVE
UROBILINOGEN UA: 0.2 (ref 0.0–1.0)
Urine Glucose: NEGATIVE

## 2014-03-19 LAB — BASIC METABOLIC PANEL
BUN: 15 mg/dL (ref 6–23)
CALCIUM: 9 mg/dL (ref 8.4–10.5)
CO2: 24 mEq/L (ref 19–32)
Chloride: 102 mEq/L (ref 96–112)
Creatinine, Ser: 0.5 mg/dL (ref 0.4–1.2)
GFR: 144.01 mL/min (ref >60.00)
Glucose, Bld: 81 mg/dL (ref 70–99)
POTASSIUM: 3.6 meq/L (ref 3.5–5.1)
Sodium: 136 mEq/L (ref 135–145)

## 2014-03-19 LAB — LIPID PANEL
CHOL/HDL RATIO: 4
CHOLESTEROL: 200 mg/dL (ref 0–200)
HDL: 45.1 mg/dL (ref >39.00)
LDL CALC: 124 mg/dL — AB (ref 0–99)
NONHDL: 154.9
Triglycerides: 154 mg/dL — ABNORMAL HIGH (ref 0.0–149.0)
VLDL: 30.8 mg/dL (ref 0.0–40.0)

## 2014-03-19 LAB — HEPATIC FUNCTION PANEL
ALT: 22 U/L (ref 0–35)
AST: 16 U/L (ref 0–37)
Albumin: 4 g/dL (ref 3.5–5.2)
Alkaline Phosphatase: 40 U/L (ref 39–117)
Bilirubin, Direct: 0 mg/dL (ref 0.0–0.3)
Total Bilirubin: 0.5 mg/dL (ref 0.2–1.2)
Total Protein: 7.5 g/dL (ref 6.0–8.3)

## 2014-03-19 LAB — TSH: TSH: 9.69 u[IU]/mL — ABNORMAL HIGH (ref 0.35–4.50)

## 2014-03-19 MED ORDER — LEVOTHYROXINE SODIUM 75 MCG PO TABS: 75.0000 ug | ORAL_TABLET | Freq: Every day | ORAL | Status: DC

## 2014-03-19 MED ORDER — PHENTERMINE-TOPIRAMATE 7.5-46 MG PO CP24
1.0000 | ORAL_CAPSULE | Freq: Every day | ORAL | Status: DC
Start: 2014-03-19 — End: 2014-03-19

## 2014-03-19 NOTE — Addendum Note (Signed)
Addended by: Gwendolyn Grant A on: 03/19/2014 08:59 PM   Modules accepted: Orders

## 2014-03-19 NOTE — Patient Instructions (Addendum)
It was good to see you today.  We have reviewed your prior records including labs and tests today  Health Maintenance reviewed - let us know when flu and PAP are done- all recommended immunizations and age-appropriate screenings are up-to-date.  Test(s) ordered today. Your results will be released to [facility] (or called to you) after review, usually within 72hours after test completion. If any changes need to be made, you will be notified at that same time.  Medications reviewed and updated Use Qysmia as instructed for weight loss - no other changes recommended at this time. Your prescription(s) have been given to you to submit to your pharmacy. Please take as directed and contact our office if you believe you are having problem(s) with the medication(s).  Please schedule followup in 12 months for annual exam and labs, call sooner if problems.  Health Maintenance Adopting a healthy lifestyle and getting preventive care can go a long way to promote health and wellness. Talk with your health care provider about what schedule of regular examinations is right for you. This is a good chance for you to check in with your provider about disease prevention and staying healthy. In between checkups, there are plenty of things you can do on your own. Experts have done a lot of research about which lifestyle changes and preventive measures are most likely to keep you healthy. Ask your health care provider for more information. WEIGHT AND DIET  Eat a healthy diet  Be sure to include plenty of vegetables, fruits, low-fat dairy products, and lean protein.  Do not eat a lot of foods high in solid fats, added sugars, or salt.  Get regular exercise. This is one of the most important things you can do for your health.  Most adults should exercise for at least 150 minutes each week. The exercise should increase your heart rate and make you sweat (moderate-intensity exercise).  Most adults should also do  strengthening exercises at least twice a week. This is in addition to the moderate-intensity exercise.  Maintain a healthy weight  Body mass index (BMI) is a measurement that can be used to identify possible weight problems. It estimates body fat based on height and weight. Your health care provider can help determine your BMI and help you achieve or maintain a healthy weight.  For females 33 years of age and older:   A BMI below 18.5 is considered underweight.  A BMI of 18.5 to 24.9 is normal.  A BMI of 25 to 29.9 is considered overweight.  A BMI of 30 and above is considered obese.  Watch levels of cholesterol and blood lipids  You should start having your blood tested for lipids and cholesterol at 27 years of age, then have this test every 5 years.  You may need to have your cholesterol levels checked more often if:  Your lipid or cholesterol levels are high.  You are older than 27 years of age.  You are at high risk for heart disease.  CANCER SCREENING   Lung Cancer  Lung cancer screening is recommended for adults 70-11 years old who are at high risk for lung cancer because of a history of smoking.  A yearly low-dose CT scan of the lungs is recommended for people who:  Currently smoke.  Have quit within the past 15 years.  Have at least a 30-pack-year history of smoking. A pack year is smoking an average of one pack of cigarettes a day for 1 year.  Yearly  screening should continue until it has been 15 years since you quit.  Yearly screening should stop if you develop a health problem that would prevent you from having lung cancer treatment.  Breast Cancer  Practice breast self-awareness. This means understanding how your breasts normally appear and feel.  It also means doing regular breast self-exams. Let your health care provider know about any changes, no matter how small.  If you are in your 20s or 30s, you should have a clinical breast exam (CBE) by a  health care provider every 1-3 years as part of a regular health exam.  If you are 48 or older, have a CBE every year. Also consider having a breast X-ray (mammogram) every year.  If you have a family history of breast cancer, talk to your health care provider about genetic screening.  If you are at high risk for breast cancer, talk to your health care provider about having an MRI and a mammogram every year.  Breast cancer gene (BRCA) assessment is recommended for women who have family members with BRCA-related cancers. BRCA-related cancers include:  Breast.  Ovarian.  Tubal.  Peritoneal cancers.  Results of the assessment will determine the need for genetic counseling and BRCA1 and BRCA2 testing. Cervical Cancer Routine pelvic examinations to screen for cervical cancer are no longer recommended for nonpregnant women who are considered low risk for cancer of the pelvic organs (ovaries, uterus, and vagina) and who do not have symptoms. A pelvic examination may be necessary if you have symptoms including those associated with pelvic infections. Ask your health care provider if a screening pelvic exam is right for you.   The Pap test is the screening test for cervical cancer for women who are considered at risk.  If you had a hysterectomy for a problem that was not cancer or a condition that could lead to cancer, then you no longer need Pap tests.  If you are older than 65 years, and you have had normal Pap tests for the past 10 years, you no longer need to have Pap tests.  If you have had past treatment for cervical cancer or a condition that could lead to cancer, you need Pap tests and screening for cancer for at least 20 years after your treatment.  If you no longer get a Pap test, assess your risk factors if they change (such as having a new sexual partner). This can affect whether you should start being screened again.  Some women have medical problems that increase their chance of  getting cervical cancer. If this is the case for you, your health care provider may recommend more frequent screening and Pap tests.  The human papillomavirus (HPV) test is another test that may be used for cervical cancer screening. The HPV test looks for the virus that can cause cell changes in the cervix. The cells collected during the Pap test can be tested for HPV.  The HPV test can be used to screen women 55 years of age and older. Getting tested for HPV can extend the interval between normal Pap tests from three to five years.  An HPV test also should be used to screen women of any age who have unclear Pap test results.  After 27 years of age, women should have HPV testing as often as Pap tests.  Colorectal Cancer  This type of cancer can be detected and often prevented.  Routine colorectal cancer screening usually begins at 27 years of age and continues through  27 years of age.  Your health care provider may recommend screening at an earlier age if you have risk factors for colon cancer.  Your health care provider may also recommend using home test kits to check for hidden blood in the stool.  A small camera at the end of a tube can be used to examine your colon directly (sigmoidoscopy or colonoscopy). This is done to check for the earliest forms of colorectal cancer.  Routine screening usually begins at age 46.  Direct examination of the colon should be repeated every 5-10 years through 27 years of age. However, you may need to be screened more often if early forms of precancerous polyps or small growths are found. Skin Cancer  Check your skin from head to toe regularly.  Tell your health care provider about any new moles or changes in moles, especially if there is a change in a mole's shape or color.  Also tell your health care provider if you have a mole that is larger than the size of a pencil eraser.  Always use sunscreen. Apply sunscreen liberally and repeatedly  throughout the day.  Protect yourself by wearing long sleeves, pants, a wide-brimmed hat, and sunglasses whenever you are outside. HEART DISEASE, DIABETES, AND HIGH BLOOD PRESSURE   Have your blood pressure checked at least every 1-2 years. High blood pressure causes heart disease and increases the risk of stroke.  If you are between 44 years and 75 years old, ask your health care provider if you should take aspirin to prevent strokes.  Have regular diabetes screenings. This involves taking a blood sample to check your fasting blood sugar level.  If you are at a normal weight and have a low risk for diabetes, have this test once every three years after 27 years of age.  If you are overweight and have a high risk for diabetes, consider being tested at a younger age or more often. PREVENTING INFECTION  Hepatitis B  If you have a higher risk for hepatitis B, you should be screened for this virus. You are considered at high risk for hepatitis B if:  You were born in a country where hepatitis B is common. Ask your health care provider which countries are considered high risk.  Your parents were born in a high-risk country, and you have not been immunized against hepatitis B (hepatitis B vaccine).  You have HIV or AIDS.  You use needles to inject street drugs.  You live with someone who has hepatitis B.  You have had sex with someone who has hepatitis B.  You get hemodialysis treatment.  You take certain medicines for conditions, including cancer, organ transplantation, and autoimmune conditions. Hepatitis C  Blood testing is recommended for:  Everyone born from [birth year] through 1964.  Anyone with known risk factors for hepatitis C. Sexually transmitted infections (STIs)  You should be screened for sexually transmitted infections (STIs) including gonorrhea and chlamydia if:  You are sexually active and are younger than 27 years of age.  You are older than 27 years of age and your  health care provider tells you that you are at risk for this type of infection.  Your sexual activity has changed since you were last screened and you are at an increased risk for chlamydia or gonorrhea. Ask your health care provider if you are at risk.  If you do not have HIV, but are at risk, it may be recommended that you take a prescription medicine daily to  prevent HIV infection. This is called pre-exposure prophylaxis (PrEP). You are considered at risk if:  You are sexually active and do not regularly use condoms or know the HIV status of your partner(s).  You take drugs by injection.  You are sexually active with a partner who has HIV. Talk with your health care provider about whether you are at high risk of being infected with HIV. If you choose to begin PrEP, you should first be tested for HIV. You should then be tested every 3 months for as long as you are taking PrEP.  PREGNANCY   If you are premenopausal and you may become pregnant, ask your health care provider about preconception counseling.  If you may become pregnant, take 400 to 800 micrograms (mcg) of folic acid every day.  If you want to prevent pregnancy, talk to your health care provider about birth control (contraception). OSTEOPOROSIS AND MENOPAUSE   Osteoporosis is a disease in which the bones lose minerals and strength with aging. This can result in serious bone fractures. Your risk for osteoporosis can be identified using a bone density scan.  If you are 4 years of age or older, or if you are at risk for osteoporosis and fractures, ask your health care provider if you should be screened.  Ask your health care provider whether you should take a calcium or vitamin D supplement to lower your risk for osteoporosis.  Menopause may have certain physical symptoms and risks.  Hormone replacement therapy may reduce some of these symptoms and risks. Talk to your health care provider about whether hormone replacement  therapy is right for you.  HOME CARE INSTRUCTIONS   Schedule regular health, dental, and eye exams.  Stay current with your immunizations.   Do not use any tobacco products including cigarettes, chewing tobacco, or electronic cigarettes.  If you are pregnant, do not drink alcohol.  If you are breastfeeding, limit how much and how often you drink alcohol.  Limit alcohol intake to no more than 1 drink per day for nonpregnant women. One drink equals 12 ounces of beer, 5 ounces of wine, or 1 ounces of hard liquor.  Do not use street drugs.  Do not share needles.  Ask your health care provider for help if you need support or information about quitting drugs.  Tell your health care provider if you often feel depressed.  Tell your health care provider if you have ever been abused or do not feel safe at home. Document Released: 12/22/2010 Document Revised: 10/23/2013 Document Reviewed: 05/10/2013 [facility] Patient Information 2015 [place], [place]. This information is not intended to replace advice given to you by your health care provider. Make sure you discuss any questions you have with your health care provider.

## 2014-03-19 NOTE — Assessment & Plan Note (Signed)
Wt Readings from Last 3 Encounters:  03/19/14 220 lb (99.791 kg)  08/07/13 201 lb (91.173 kg)  03/17/13 193 lb (87.544 kg)  Body mass index is 37.74 kg/(m^2). began Phentermine 11/2012 to help reach weight loss goals - Did well : 25 pound weight loss reviewed, but slow recurrence Discussed options for long term mgmt of obesity - Belviq and Contrave cost prohibitive Will investigate cost of Qysmia - rx done -we reviewed potential risk/benefit and possible side effects - pt understands and agrees to same  If Qysmia too costly, will resume another 90d phentermine given short term success with same in past Continue to encourage on aerobic exercise to accompany diet changes for sustained weight loss

## 2014-03-19 NOTE — Assessment & Plan Note (Signed)
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

## 2014-03-19 NOTE — Progress Notes (Signed)
Subjective:    Patient ID: Cheryl Hampton, female    DOB: 1986/03/02, 28 y.o.   MRN: [id number]  HPI  patient is here today for annual physical. Patient feels well and has no complaints.  Also reviewed chronic medical issues and interval medical events  Past Medical History  Diagnosis Date  . Depression   . Migraine headache   . 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 Alcohol abuse Maternal Uncle   . Pancreatic cancer Maternal Grandmother   . Stroke Other     grandparent  . Hypertension Other     parent., grandparent  . Diabetes Other     parent, grandparent   History  Substance Use Topics  . Smoking status: Never Smoker   . Smokeless tobacco: Not on file  . Alcohol Use: No    Review of Systems  Constitutional: Positive for unexpected weight change (gain). Negative for fatigue.  Respiratory: Negative for cough, shortness of breath and wheezing.   Cardiovascular: Negative for chest pain, palpitations and leg swelling.  Gastrointestinal: Negative for nausea, abdominal pain and diarrhea.  Genitourinary: Positive for dysuria (x 3 days).  Neurological: Negative for dizziness, weakness, light-headedness and headaches.  Psychiatric/Behavioral: Negative for dysphoric mood. The patient is not nervous/anxious.   All other systems reviewed and are negative.      Objective:   Physical Exam  BP 128/82  Pulse 78  Temp(Src) 97.9 F (36.6 C) (Oral)  Ht 5\' 4"  (1.626 m)  Wt 220 lb (99.791 kg)  BMI 37.74 kg/m2  SpO2 98%  LMP 03/03/2014 Wt Readings from Last 3 Encounters:  03/19/14 220 lb (99.791 kg)  08/07/13 201 lb (91.173 kg)  03/17/13 193 lb (87.544 kg)   Constitutional: She is obese, appears well-developed and well-nourished. No distress.  HENT: Head: Normocephalic and atraumatic. Ears: B TMs ok, no erythema or effusion; Nose: Nose normal. Mouth/Throat: Oropharynx is clear and moist. No oropharyngeal  exudate.  Eyes: Conjunctivae and EOM are normal. Pupils are equal, round, and reactive to light. No scleral icterus.  Neck: Normal range of motion. Neck supple. No JVD present. No thyromegaly present.  Cardiovascular: Normal rate, regular rhythm and normal heart sounds.  No murmur heard. No BLE edema. Pulmonary/Chest: Effort normal and breath sounds normal. No respiratory distress. She has no wheezes.  Abdominal: Soft. Bowel sounds are normal. She exhibits no distension. There is no tenderness. no masses GU/breast: defer to gyn Musculoskeletal: Normal range of motion, no joint effusions. No gross deformities Neurological: She is alert and oriented to person, place, and time. No cranial nerve deficit. Coordination, balance, strength, speech and gait are normal.  Skin: Skin is warm and dry. No rash noted. No erythema.  Psychiatric: She has a normal mood and affect. Her behavior is normal. Judgment and thought content normal.     Lab Results  Component Value Date   WBC 6.1 03/17/2013   HGB 13.7 03/17/2013   HCT 40.3 03/17/2013   PLT 352.0 03/17/2013   GLUCOSE 85 03/17/2013   CHOL 172 03/17/2013   TRIG 119.0 03/17/2013   HDL 37.00* 03/17/2013   LDLDIRECT 122.7 02/06/2011   LDLCALC 111* 03/17/2013   ALT 41* 03/17/2013   AST 26 03/17/2013   NA 140 03/17/2013   K 4.2 03/17/2013   CL 107 03/17/2013   CREATININE 0.7 03/17/2013   BUN 8 03/17/2013   CO2 27 03/17/2013   TSH 1.42 05/04/2013  Dg Sacrum/coccyx  01/02/2014   CLINICAL DATA:  Buttock pain following injury  EXAM: SACRUM AND COCCYX - 2+ VIEW  COMPARISON:  None.  FINDINGS: The pelvic ring is intact. No acute fracture is seen. The sacrum appears intact. No soft tissue changes are noted.  IMPRESSION: No acute abnormality seen.   Electronically Signed   By: Inez Catalina M.D.   On: 01/02/2014 16:14       Assessment & Plan:   CPX/v70.0 - Patient has been counseled on age-appropriate routine health concerns for screening and prevention. These are  reviewed and up-to-date. Immunizations are up-to-date or declined. Labs ordered and reviewed.  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     Obesity, unspecified      Wt Readings from Last 3 Encounters:  03/19/14 220 lb (99.791 kg)  08/07/13 201 lb (91.173 kg)  03/17/13 193 lb (87.544 kg)  Body mass index is 37.74 kg/(m^2). began Phentermine 11/2012 to help reach weight loss goals - Did well : 25 pound weight loss reviewed, but slow recurrence Discussed options for long term mgmt of obesity - Belviq and Contrave cost prohibitive Will investigate cost of Qysmia - rx done -we reviewed potential risk/benefit and possible side effects - pt understands and agrees to same  If Qysmia too costly, will resume another 90d phentermine given short term success with same in past Continue to encourage on aerobic exercise to accompany diet changes for sustained weight loss    Relevant Medications      Phentermine-Topiramate 7.5-46 MG CP24    Other Visit Diagnoses   Routine general medical examination at a health care facility    -  Primary    Relevant Orders       Basic metabolic panel       CBC with Differential       Hepatic function panel       Lipid panel       TSH       Urinalysis, Routine w reflex microscopic

## 2014-03-19 NOTE — Telephone Encounter (Signed)
Please print for me to sign for pick up thanks

## 2014-03-19 NOTE — Progress Notes (Signed)
Pre visit review using our clinic review tool, if applicable. No additional management support is needed unless otherwise documented below in the visit note. 

## 2014-03-20 ENCOUNTER — Other Ambulatory Visit: Payer: Self-pay

## 2014-03-20 MED ORDER — PHENTERMINE HCL 37.5 MG PO CAPS
37.5000 mg | ORAL_CAPSULE | ORAL | Status: DC
Start: 2014-03-20 — End: 2014-03-20

## 2014-03-20 MED ORDER — PHENTERMINE HCL 37.5 MG PO CAPS: 37.5000 mg | ORAL_CAPSULE | ORAL | Status: DC

## 2014-04-05 LAB — HM PAP SMEAR

## 2014-04-06 ENCOUNTER — Encounter: Payer: Self-pay | Admitting: Internal Medicine

## 2014-04-06 DIAGNOSIS — E039 Hypothyroidism, unspecified: Secondary | ICD-10-CM

## 2014-04-11 ENCOUNTER — Encounter: Payer: Self-pay | Admitting: Internal Medicine

## 2014-04-13 ENCOUNTER — Ambulatory Visit: Payer: 59 | Admitting: *Deleted

## 2014-04-13 VITALS — Wt 212.8 lb

## 2014-04-13 DIAGNOSIS — Z0130 Encounter for examination of blood pressure without abnormal findings: Secondary | ICD-10-CM

## 2014-04-16 ENCOUNTER — Telehealth: Payer: Self-pay | Admitting: Internal Medicine

## 2014-04-16 MED ORDER — PHENTERMINE HCL 37.5 MG PO CAPS: 37.5000 mg | ORAL_CAPSULE | ORAL | Status: DC

## 2014-04-21 ENCOUNTER — Encounter: Payer: Self-pay | Admitting: Internal Medicine

## 2014-04-30 ENCOUNTER — Encounter: Payer: Self-pay | Admitting: Internal Medicine

## 2014-05-01 MED ORDER — ESCITALOPRAM OXALATE 10 MG PO TABS: 10.0000 mg | ORAL_TABLET | Freq: Every day | ORAL | Status: DC

## 2014-05-01 MED ORDER — BUPROPION HCL ER (XL) 150 MG PO TB24: 150.0000 mg | ORAL_TABLET | Freq: Every day | ORAL | Status: DC

## 2014-05-21 ENCOUNTER — Ambulatory Visit: Payer: 59 | Admitting: Psychology

## 2014-05-26 ENCOUNTER — Other Ambulatory Visit: Payer: Self-pay | Admitting: Internal Medicine

## 2014-05-28 MED ORDER — ONDANSETRON HCL 4 MG PO TABS: 4.0000 mg | ORAL_TABLET | Freq: Three times a day (TID) | ORAL | Status: DC | PRN

## 2014-05-29 MED ORDER — PHENTERMINE HCL 37.5 MG PO CAPS: 37.5000 mg | ORAL_CAPSULE | ORAL | Status: DC

## 2014-06-04 ENCOUNTER — Ambulatory Visit: Payer: 59 | Admitting: Psychology

## 2014-06-06 ENCOUNTER — Other Ambulatory Visit: Payer: Self-pay | Admitting: Internal Medicine

## 2014-06-19 ENCOUNTER — Ambulatory Visit: Payer: 59 | Admitting: Psychology

## 2014-06-27 ENCOUNTER — Encounter: Payer: Self-pay | Admitting: Internal Medicine

## 2014-06-28 MED ORDER — BUPROPION HCL ER (XL) 150 MG PO TB24: 150.0000 mg | ORAL_TABLET | Freq: Every day | ORAL | Status: DC

## 2014-06-29 ENCOUNTER — Other Ambulatory Visit (INDEPENDENT_AMBULATORY_CARE_PROVIDER_SITE_OTHER): Payer: 59

## 2014-06-29 ENCOUNTER — Ambulatory Visit (INDEPENDENT_AMBULATORY_CARE_PROVIDER_SITE_OTHER): Payer: 59 | Admitting: Psychology

## 2014-06-29 DIAGNOSIS — E039 Hypothyroidism, unspecified: Secondary | ICD-10-CM

## 2014-06-29 DIAGNOSIS — F332 Major depressive disorder, recurrent severe without psychotic features: Secondary | ICD-10-CM

## 2014-06-29 LAB — TSH: TSH: 2.01 u[IU]/mL (ref 0.35–4.50)

## 2014-07-05 ENCOUNTER — Ambulatory Visit (INDEPENDENT_AMBULATORY_CARE_PROVIDER_SITE_OTHER): Payer: 59 | Admitting: Psychology

## 2014-07-05 DIAGNOSIS — F332 Major depressive disorder, recurrent severe without psychotic features: Secondary | ICD-10-CM

## 2014-07-10 ENCOUNTER — Ambulatory Visit (INDEPENDENT_AMBULATORY_CARE_PROVIDER_SITE_OTHER): Payer: 59 | Admitting: Psychology

## 2014-07-10 DIAGNOSIS — F332 Major depressive disorder, recurrent severe without psychotic features: Secondary | ICD-10-CM

## 2014-07-16 ENCOUNTER — Ambulatory Visit (INDEPENDENT_AMBULATORY_CARE_PROVIDER_SITE_OTHER): Payer: 59 | Admitting: Psychology

## 2014-07-16 DIAGNOSIS — F332 Major depressive disorder, recurrent severe without psychotic features: Secondary | ICD-10-CM

## 2014-07-24 ENCOUNTER — Ambulatory Visit: Payer: 59 | Admitting: Psychology

## 2014-07-25 ENCOUNTER — Ambulatory Visit: Payer: 59 | Admitting: Psychology

## 2014-08-22 ENCOUNTER — Other Ambulatory Visit: Payer: Self-pay | Admitting: Pulmonary Disease

## 2014-08-22 MED ORDER — AZITHROMYCIN 250 MG PO TABS: ORAL_TABLET | ORAL | Status: DC

## 2014-08-22 MED ORDER — PREDNISONE (PAK) 5 MG PO TABS: ORAL_TABLET | ORAL | Status: DC

## 2014-10-15 ENCOUNTER — Other Ambulatory Visit: Payer: Self-pay | Admitting: Pulmonary Disease

## 2014-10-15 MED ORDER — NEOMYCIN-POLYMYXIN-HC 3.5-10000-1 OP SUSP: 1.0000 [drp] | OPHTHALMIC | Status: DC

## 2014-10-16 ENCOUNTER — Other Ambulatory Visit: Payer: Self-pay | Admitting: Internal Medicine

## 2014-10-16 NOTE — Telephone Encounter (Signed)
MD out of office pls advise on refill.../lmb 

## 2014-10-16 NOTE — Telephone Encounter (Signed)
Done hardcopy to Cherina  

## 2014-10-16 NOTE — Telephone Encounter (Signed)
Rx has been sent  

## 2014-11-01 IMAGING — CR DG SACRUM/COCCYX 2+V
3 series · 3 of 3 positions shown · non-contrast
Comparison: None.

CLINICAL DATA: Buttock pain following injury

EXAM:
SACRUM AND COCCYX - 2+ VIEW

[view not recorded (1 of 3)]
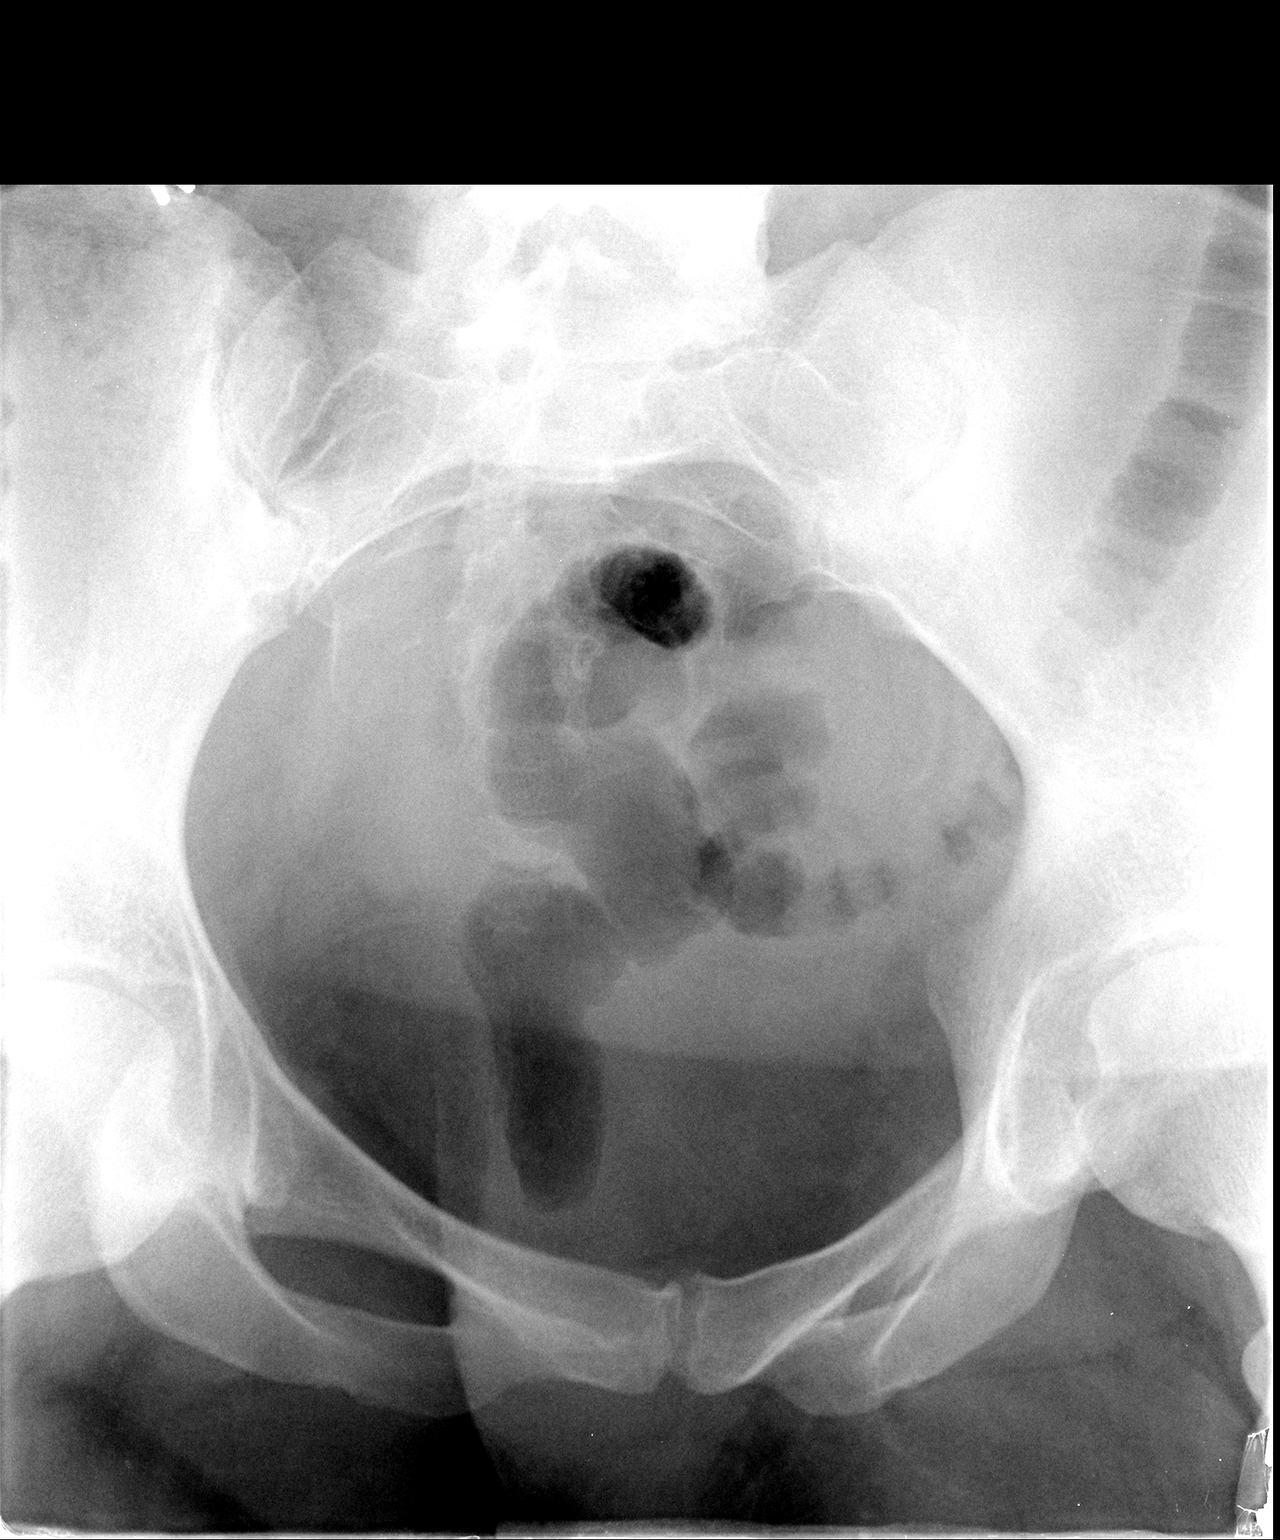

[view not recorded (2 of 3)]
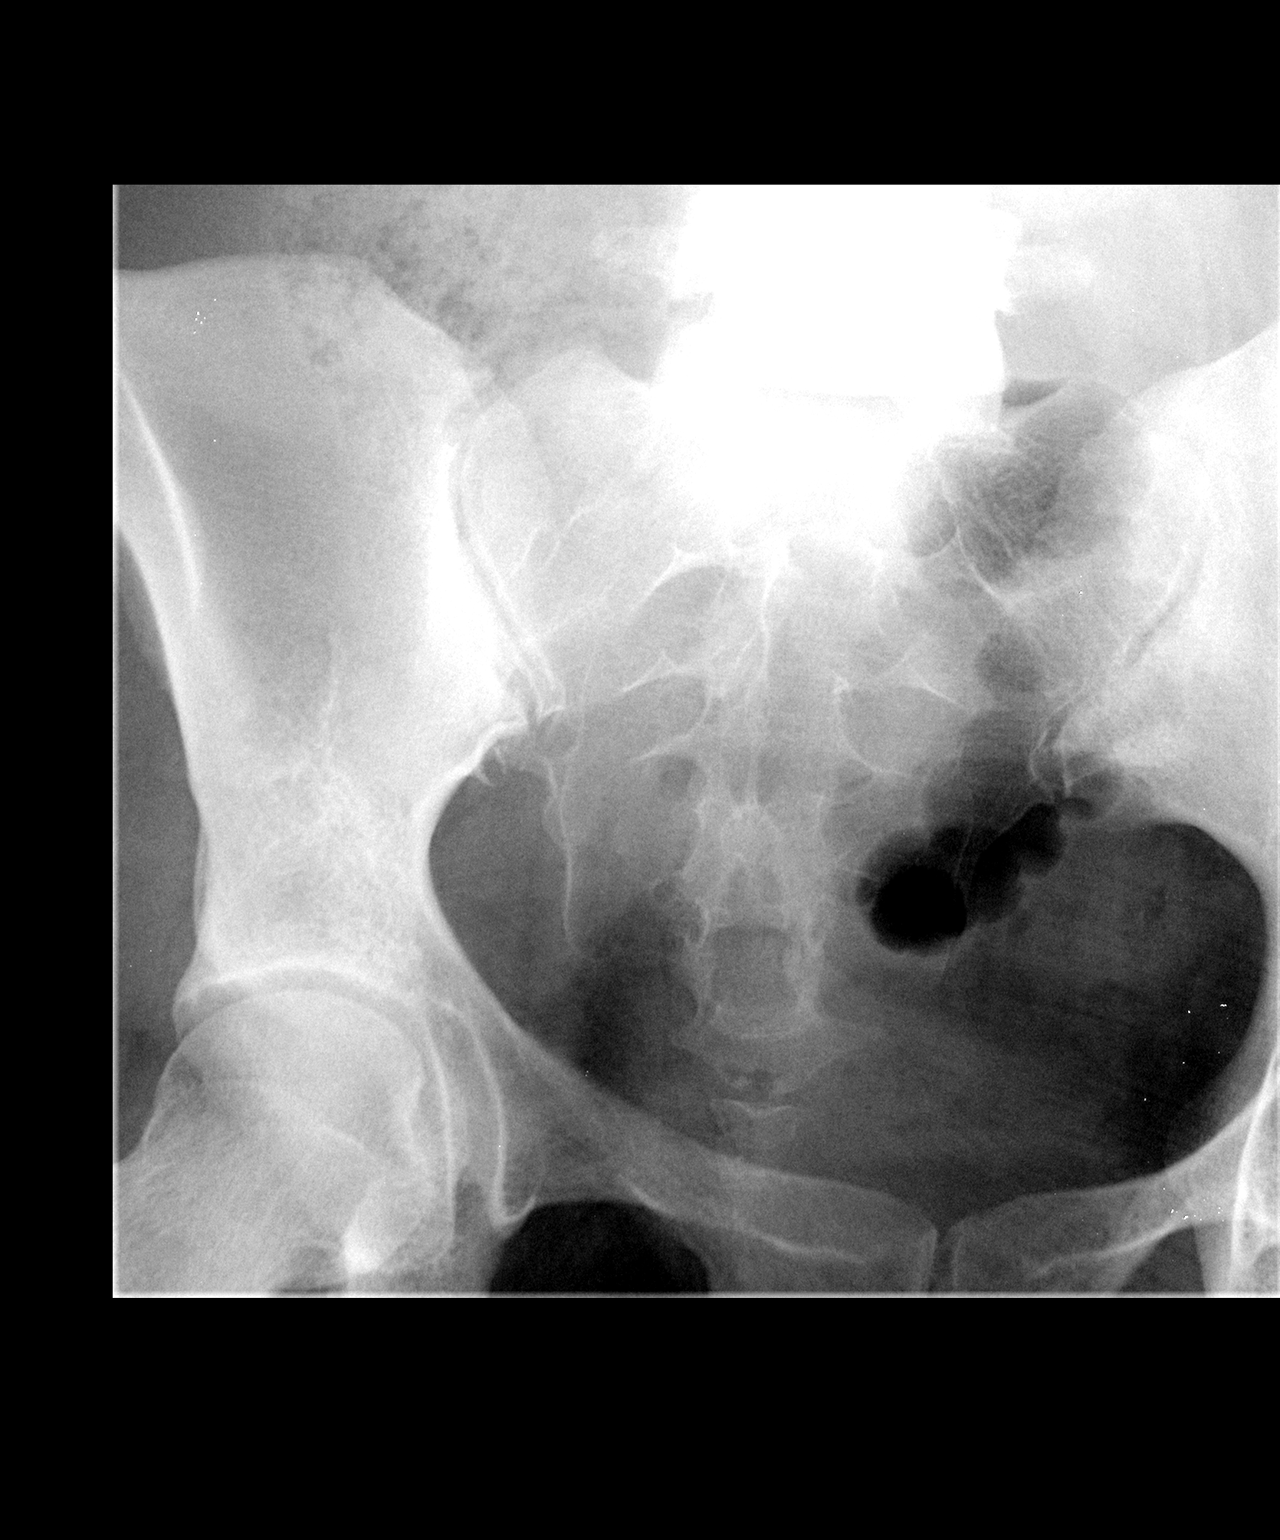

[view not recorded (3 of 3)]
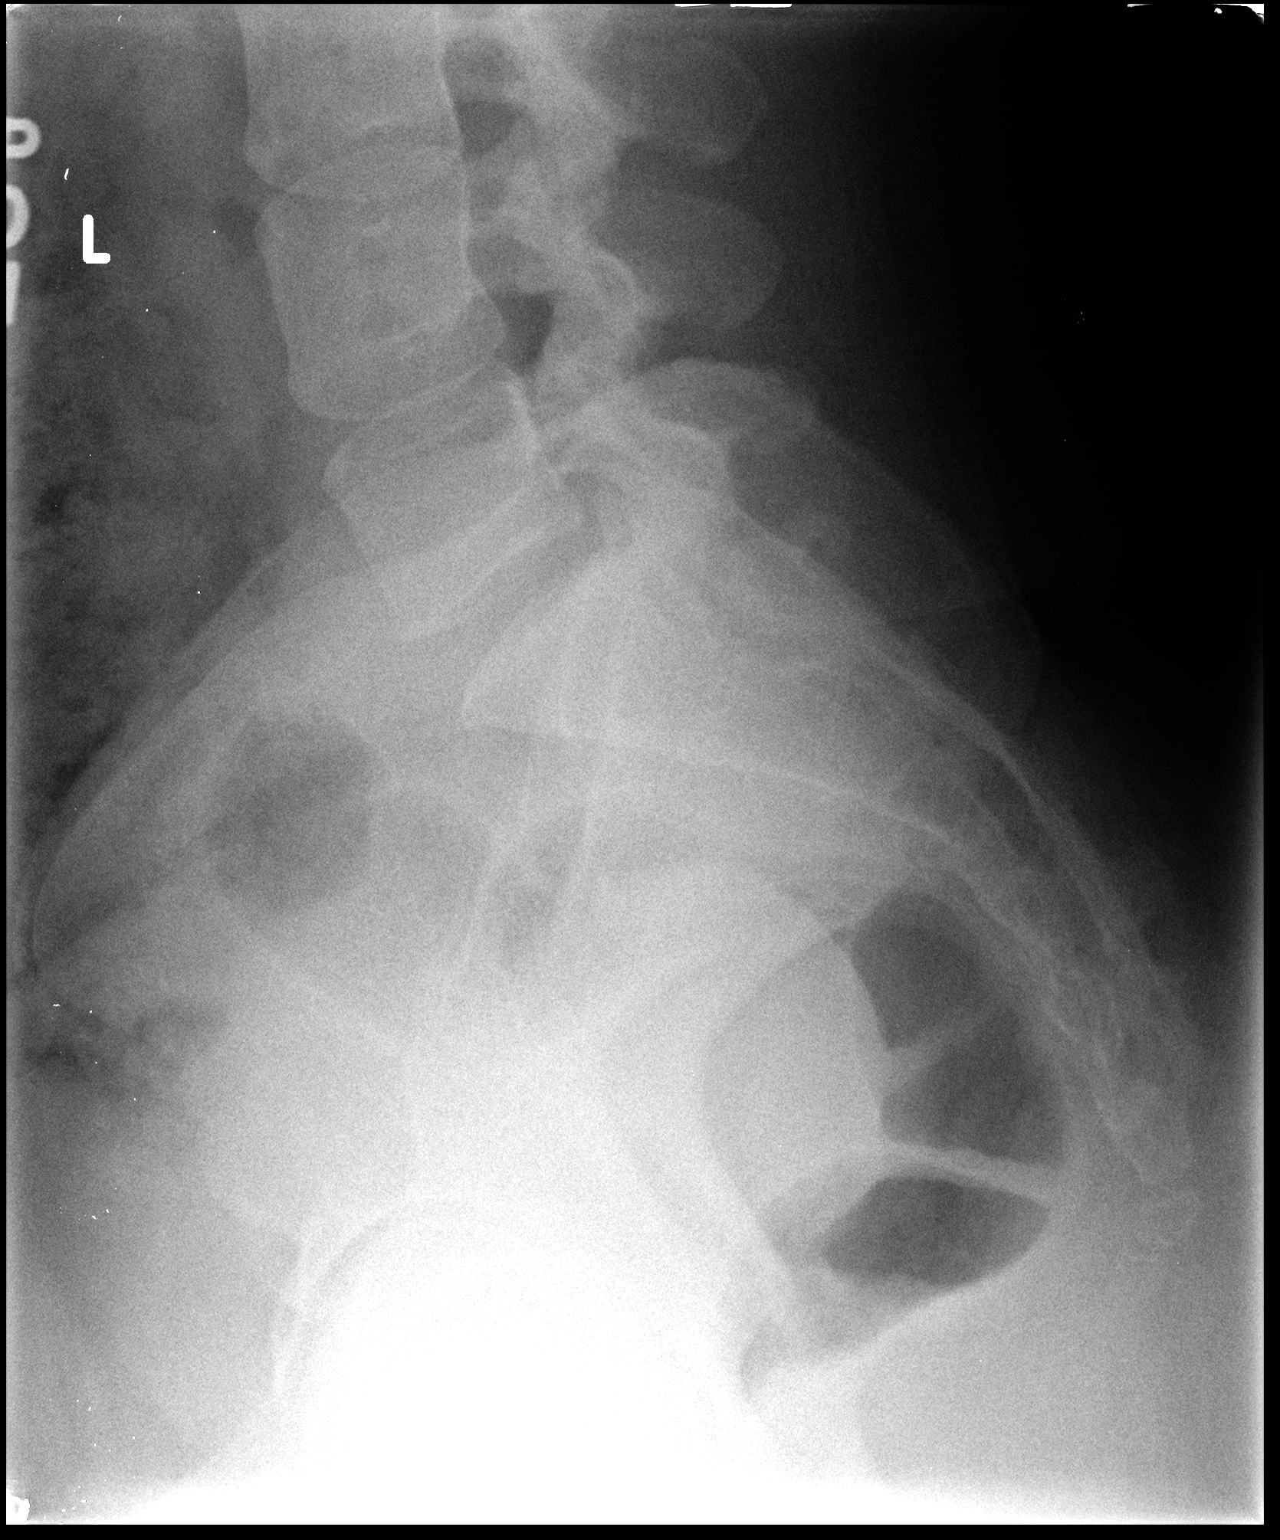

[3 of 3 positions shown; findings below may reference images not displayed]

FINDINGS: The pelvic ring is intact. No acute fracture is seen. The sacrum
appears intact. No soft tissue changes are noted.
IMPRESSION: No acute abnormality seen.

## 2014-12-07 ENCOUNTER — Ambulatory Visit (INDEPENDENT_AMBULATORY_CARE_PROVIDER_SITE_OTHER): Payer: 59 | Admitting: Psychology

## 2014-12-07 DIAGNOSIS — F332 Major depressive disorder, recurrent severe without psychotic features: Secondary | ICD-10-CM

## 2014-12-31 ENCOUNTER — Other Ambulatory Visit: Payer: Self-pay | Admitting: Internal Medicine

## 2014-12-31 MED ORDER — PHENTERMINE HCL 37.5 MG PO CAPS: 37.5000 mg | ORAL_CAPSULE | ORAL | Status: DC

## 2014-12-31 NOTE — Addendum Note (Signed)
Addended by: Lowella Dandy on: 12/31/2014 04:04 PM   Modules accepted: Orders

## 2015-01-02 ENCOUNTER — Telehealth: Payer: Self-pay

## 2015-01-02 NOTE — Telephone Encounter (Signed)
LVM for pt to call back as soon as possible.    RE: rx ready for pick up at the front.  Pt needs to be added to nurse visit. Let a CMA know so that her weight can be added to the chart.

## 2015-01-23 ENCOUNTER — Encounter: Payer: Self-pay | Admitting: Internal Medicine

## 2015-01-23 DIAGNOSIS — E038 Other specified hypothyroidism: Secondary | ICD-10-CM

## 2015-02-26 LAB — OB RESULTS CONSOLE HEPATITIS B SURFACE ANTIGEN: Hepatitis B Surface Ag: NEGATIVE

## 2015-02-26 LAB — OB RESULTS CONSOLE GC/CHLAMYDIA
Chlamydia: NEGATIVE
GC PROBE AMP, GENITAL: NEGATIVE

## 2015-02-26 LAB — OB RESULTS CONSOLE ABO/RH: RH Type: POSITIVE

## 2015-02-26 LAB — OB RESULTS CONSOLE ANTIBODY SCREEN: Antibody Screen: NEGATIVE

## 2015-02-26 LAB — OB RESULTS CONSOLE HIV ANTIBODY (ROUTINE TESTING): HIV: NONREACTIVE

## 2015-02-26 LAB — OB RESULTS CONSOLE RPR: RPR: NONREACTIVE

## 2015-02-26 LAB — OB RESULTS CONSOLE RUBELLA ANTIBODY, IGM: Rubella: IMMUNE

## 2015-03-25 ENCOUNTER — Encounter: Payer: 59 | Admitting: Internal Medicine

## 2015-05-14 ENCOUNTER — Other Ambulatory Visit (INDEPENDENT_AMBULATORY_CARE_PROVIDER_SITE_OTHER): Payer: 59

## 2015-05-14 ENCOUNTER — Ambulatory Visit (INDEPENDENT_AMBULATORY_CARE_PROVIDER_SITE_OTHER): Payer: 59 | Admitting: Internal Medicine

## 2015-05-14 ENCOUNTER — Encounter: Payer: Self-pay | Admitting: Internal Medicine

## 2015-05-14 VITALS — BP 122/78 | HR 78 | Temp 98.0°F | Ht 64.0 in | Wt 221.8 lb

## 2015-05-14 DIAGNOSIS — F329 Major depressive disorder, single episode, unspecified: Secondary | ICD-10-CM

## 2015-05-14 DIAGNOSIS — R7989 Other specified abnormal findings of blood chemistry: Secondary | ICD-10-CM

## 2015-05-14 DIAGNOSIS — Z0000 Encounter for general adult medical examination without abnormal findings: Secondary | ICD-10-CM

## 2015-05-14 DIAGNOSIS — F32A Depression, unspecified: Secondary | ICD-10-CM

## 2015-05-14 DIAGNOSIS — E038 Other specified hypothyroidism: Secondary | ICD-10-CM | POA: Diagnosis not present

## 2015-05-14 LAB — URINALYSIS, ROUTINE W REFLEX MICROSCOPIC
Bilirubin Urine: NEGATIVE
HGB URINE DIPSTICK: NEGATIVE
KETONES UR: NEGATIVE
NITRITE: NEGATIVE
RBC / HPF: NONE SEEN (ref 0–?)
SPECIFIC GRAVITY, URINE: 1.02 (ref 1.000–1.030)
Total Protein, Urine: NEGATIVE
URINE GLUCOSE: NEGATIVE
UROBILINOGEN UA: 0.2 (ref 0.0–1.0)
pH: 7 (ref 5.0–8.0)

## 2015-05-14 LAB — CBC WITH DIFFERENTIAL/PLATELET
BASOS PCT: 0.4 % (ref 0.0–3.0)
Basophils Absolute: 0 10*3/uL (ref 0.0–0.1)
EOS PCT: 0.7 % (ref 0.0–5.0)
Eosinophils Absolute: 0.1 10*3/uL (ref 0.0–0.7)
HEMATOCRIT: 33.5 % — AB (ref 36.0–46.0)
HEMOGLOBIN: 10.9 g/dL — AB (ref 12.0–15.0)
LYMPHS PCT: 15.1 % (ref 12.0–46.0)
Lymphs Abs: 1.4 10*3/uL (ref 0.7–4.0)
MCHC: 32.5 g/dL (ref 30.0–36.0)
MCV: 89.3 fl (ref 78.0–100.0)
Monocytes Absolute: 0.4 10*3/uL (ref 0.1–1.0)
Monocytes Relative: 4.8 % (ref 3.0–12.0)
NEUTROS ABS: 7.1 10*3/uL (ref 1.4–7.7)
NEUTROS PCT: 79 % — AB (ref 43.0–77.0)
Platelets: 321 10*3/uL (ref 150.0–400.0)
RBC: 3.75 Mil/uL — ABNORMAL LOW (ref 3.87–5.11)
RDW: 14.4 % (ref 11.5–15.5)
WBC: 9 10*3/uL (ref 4.0–10.5)

## 2015-05-14 LAB — BASIC METABOLIC PANEL
BUN: 7 mg/dL (ref 6–23)
CALCIUM: 8.7 mg/dL (ref 8.4–10.5)
CHLORIDE: 104 meq/L (ref 96–112)
CO2: 24 meq/L (ref 19–32)
CREATININE: 0.4 mg/dL (ref 0.40–1.20)
GFR: 201.89 mL/min (ref >60.00)
Glucose, Bld: 74 mg/dL (ref 70–99)
Potassium: 3.8 mEq/L (ref 3.5–5.1)
Sodium: 136 mEq/L (ref 135–145)

## 2015-05-14 LAB — TSH: TSH: 2.63 u[IU]/mL (ref 0.35–4.50)

## 2015-05-14 LAB — LIPID PANEL
CHOL/HDL RATIO: 4
CHOLESTEROL: 213 mg/dL — AB (ref 0–200)
HDL: 48.5 mg/dL (ref >39.00)
NonHDL: 164.9
TRIGLYCERIDES: 204 mg/dL — AB (ref 0.0–149.0)
VLDL: 40.8 mg/dL — AB (ref 0.0–40.0)

## 2015-05-14 LAB — HEPATIC FUNCTION PANEL
ALBUMIN: 3.2 g/dL — AB (ref 3.5–5.2)
ALT: 10 U/L (ref 0–35)
AST: 9 U/L (ref 0–37)
Alkaline Phosphatase: 52 U/L (ref 39–117)
BILIRUBIN TOTAL: 0.3 mg/dL (ref 0.2–1.2)
Bilirubin, Direct: 0 mg/dL (ref 0.0–0.3)
Total Protein: 6.5 g/dL (ref 6.0–8.3)

## 2015-05-14 LAB — LDL CHOLESTEROL, DIRECT: Direct LDL: 137 mg/dL

## 2015-05-14 NOTE — Assessment & Plan Note (Signed)
Hx same as teenager, exac postpartum 10/2010 intermittent behav health counseling ([facility]) -  started sertraline 03/2011 due to irritability; also overlap with anxiety prompting low dose xanax as needed -  Changed to SSRI to wellbutrin 07/2011, titrated 12/2011 -  Added lexapro 03/2012 - improved; then indep stopped all meds 09/2012, but then resumed summer 2014 due to symptoms relapse - reports doing well at this time -but prepared for potential relapse postpartum and will initiate antidepressants immediately following delivery per report from OB Note she separated from spouse 08/2011, but reports amicable split

## 2015-05-14 NOTE — Progress Notes (Signed)
Subjective:    Patient ID: Cheryl Hampton, female    DOB: 09/16/1985, 29 y.o.   MRN: [id number]  HPI  patient is here today for annual physical. Patient feels well overall [redacted] weeks pregnant, complicated with severe nausea - unable to take po meds due to same, including no thyroid for last several months   Past Medical History  Diagnosis Date  . Depression   . Migraine headache   . 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 Alcohol abuse Maternal Uncle   . Pancreatic cancer Maternal Grandmother   . Stroke Maternal Grandfather   . Hypertension Mother   . Diabetes Mother   . Hyperlipidemia Mother    Social History  Substance Use Topics  . Smoking status: Never Smoker   . Smokeless tobacco: None  . Alcohol Use: No    Review of Systems  Constitutional: Negative for fatigue and unexpected weight change.  Respiratory: Negative for cough, shortness of breath and wheezing.   Cardiovascular: Negative for chest pain, palpitations and leg swelling.  Gastrointestinal: Positive for nausea (severe with current preg). Negative for abdominal pain and diarrhea.  Neurological: Negative for dizziness, weakness, light-headedness and headaches.  Psychiatric/Behavioral: Negative for dysphoric mood. The patient is not nervous/anxious.   All other systems reviewed and are negative.      Objective:    Physical Exam  Constitutional: She is oriented to person, place, and time. She appears well-developed and well-nourished. No distress.  obese  HENT:  Head: Normocephalic and atraumatic.  Right Ear: External ear normal.  Left Ear: External ear normal.  Nose: Nose normal.  Mouth/Throat: Oropharynx is clear and moist. No oropharyngeal exudate.  Eyes: EOM are normal. Pupils are equal, round, and reactive to light. Right eye exhibits no discharge. Left eye exhibits no discharge. No scleral icterus.  Neck: Normal range of motion. Neck  supple. No JVD present. No tracheal deviation present. No thyromegaly present.  Cardiovascular: Normal rate, regular rhythm, normal heart sounds and intact distal pulses.  Exam reveals no friction rub.   No murmur heard. Pulmonary/Chest: Effort normal and breath sounds normal. No respiratory distress. She has no wheezes. She has no rales. She exhibits no tenderness.  Abdominal: Soft. Bowel sounds are normal. She exhibits no distension and no mass. There is no tenderness. There is no rebound and no guarding.  Genitourinary:  Defer to ob/gyn : appears 21 weeks preg  Musculoskeletal: Normal range of motion.  Lymphadenopathy:    She has no cervical adenopathy.  Neurological: She is alert and oriented to person, place, and time. She has normal reflexes. No cranial nerve deficit.  Skin: Skin is warm and dry. No rash noted. She is not diaphoretic. No erythema.  Psychiatric: She has a normal mood and affect. Her behavior is normal. Judgment and thought content normal.  Nursing note and vitals reviewed.   BP 122/78 mmHg  Pulse 78  Temp(Src) 98 F (36.7 C) (Oral)  Ht 5\' 4"  (1.626 m)  Wt 221 lb 12 oz (100.585 kg)  BMI 38.04 kg/m2  SpO2 99% Wt Readings from Last 3 Encounters:  05/14/15 221 lb 12 oz (100.585 kg)  04/13/14 212 lb 12 oz (96.503 kg)  03/19/14 220 lb (99.791 kg)     Lab Results  Component Value Date   WBC 5.8 03/19/2014   HGB 13.0 03/19/2014   HCT 38.4 03/19/2014   PLT 345.0 03/19/2014   GLUCOSE  81 03/19/2014   CHOL 200 03/19/2014   TRIG 154.0* 03/19/2014   HDL 45.10 03/19/2014   LDLDIRECT 122.7 02/06/2011   LDLCALC 124* 03/19/2014   ALT 22 03/19/2014   AST 16 03/19/2014   NA 136 03/19/2014   K 3.6 03/19/2014   CL 102 03/19/2014   CREATININE 0.5 03/19/2014   BUN 15 03/19/2014   CO2 24 03/19/2014   TSH 2.01 06/29/2014    Dg Sacrum/coccyx  01/02/2014  CLINICAL DATA:  Buttock pain following injury EXAM: SACRUM AND COCCYX - 2+ VIEW COMPARISON:  None. FINDINGS: The  pelvic ring is intact. No acute fracture is seen. The sacrum appears intact. No soft tissue changes are noted. IMPRESSION: No acute abnormality seen. Electronically Signed   By: Inez Catalina M.D.   On: 01/02/2014 16:14       Assessment & Plan:   CPX/z00.00 - Patient has been counseled on age-appropriate routine health concerns for screening and prevention. These are reviewed and up-to-date. Immunizations are up-to-date or declined. Labs ordered and reviewed.  Problem List Items Addressed This Visit    Depression    Hx same as teenager, exac postpartum 10/2010 intermittent behav health counseling ([facility]) -  started sertraline 03/2011 due to irritability; also overlap with anxiety prompting low dose xanax as needed -  Changed to SSRI to wellbutrin 07/2011, titrated 12/2011 -  Added lexapro 03/2012 - improved; then indep stopped all meds 09/2012, but then resumed summer 2014 due to symptoms relapse - reports doing well at this time -but prepared for potential relapse postpartum and will initiate antidepressants immediately following delivery per report from OB Note she separated from spouse 08/2011, but reports amicable split       Hypothyroid    Lab Results  Component Value Date   TSH 2.01 06/29/2014   Incidental finding 01/2011: hyperthryoid on CPX labs -  status post endo eval by [facility] 02/2011 > 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 and no specific tx recommended except to follow Then hypothyroid on 03/2011 follow up > stated low dose synthroid 03/2011 Note she has been unable to take thyroid replacement for last 20 weeks during pregnancy due to severe nausea  Check TSH now and q3-77mo, adjust as needed       Other Visit Diagnoses    Routine general medical examination at a health care facility    -  Primary    Relevant Orders    Basic metabolic panel    CBC with Differential/Platelet    Hepatic function panel    Lipid panel    TSH    Urinalysis, Routine w reflex microscopic (not  at [facility])        Gwendolyn Grant, MD

## 2015-05-14 NOTE — Progress Notes (Signed)
Pre visit review using our clinic review tool, if applicable. No additional management support is needed unless otherwise documented below in the visit note. 

## 2015-05-14 NOTE — Patient Instructions (Signed)
It was good to see you today.  We have reviewed your prior records including labs and tests today  Health Maintenance reviewed - all recommended immunizations and age-appropriate screenings are up-to-date.  Test(s) ordered today. Your results will be released to MyChart (or called to you) after review, usually within 72hours after test completion. If any changes need to be made, you will be notified at that same time.  Medications reviewed and updated, no changes recommended at this time.  Please schedule followup in 12 months for annual exam and labs, call sooner if problems.  

## 2015-05-14 NOTE — Assessment & Plan Note (Signed)
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

## 2015-06-11 ENCOUNTER — Telehealth: Payer: Self-pay | Admitting: *Deleted

## 2015-06-11 ENCOUNTER — Telehealth: Payer: Self-pay | Admitting: Internal Medicine

## 2015-06-11 MED ORDER — AZITHROMYCIN 250 MG PO TABS: 250.0000 mg | ORAL_TABLET | Freq: Every day | ORAL | Status: DC

## 2015-06-11 NOTE — Telephone Encounter (Signed)
Per CY-send Zpak #1 take as directed to [facility] outpatient pharmacy for symptoms he discussed with patient. Pt is aware this has been sent.

## 2015-06-13 NOTE — Telephone Encounter (Signed)
Error

## 2015-06-23 NOTE — L&D Delivery Note (Signed)
Pt complete and at +1 station with urge to push. Epidural controlling pain. Pt pushed for about 15-20 mins to deliver a viable female infant in LOA position over intact perineum. Nuchal x 3 reduced over infant's head. Anterior and posterior shoulders spontaneously delivered with next two pushes; body easily followed next. Infant placed on mothers abdomen and bulb suction of mouth and nose performed. Cord was then clamped and cut by FOB. Cord blood obtained. Baby had a vigorous spontaneous cry noted. Placenta then delivered about 5 mins later intact, 3VC shultz. Fundal massage performed and pitocin per protocol. Fundus firm.Vaginal inspection confirmed periurethral abrasion only - no repair needed. Mother and baby stable. Counts correct Apgars 9 and 9

## 2015-07-03 DIAGNOSIS — O99212 Obesity complicating pregnancy, second trimester: Secondary | ICD-10-CM | POA: Diagnosis not present

## 2015-07-03 DIAGNOSIS — Z36 Encounter for antenatal screening of mother: Secondary | ICD-10-CM | POA: Diagnosis not present

## 2015-07-03 DIAGNOSIS — Z23 Encounter for immunization: Secondary | ICD-10-CM | POA: Diagnosis not present

## 2015-07-03 DIAGNOSIS — Z3A27 27 weeks gestation of pregnancy: Secondary | ICD-10-CM | POA: Diagnosis not present

## 2015-07-23 NOTE — Telephone Encounter (Signed)
Pt never pick up scripts. Rx shredded...lmb

## 2015-07-31 DIAGNOSIS — O99213 Obesity complicating pregnancy, third trimester: Secondary | ICD-10-CM | POA: Diagnosis not present

## 2015-07-31 DIAGNOSIS — O3663X0 Maternal care for excessive fetal growth, third trimester, not applicable or unspecified: Secondary | ICD-10-CM | POA: Diagnosis not present

## 2015-07-31 DIAGNOSIS — Z3A31 31 weeks gestation of pregnancy: Secondary | ICD-10-CM | POA: Diagnosis not present

## 2015-08-08 DIAGNOSIS — Z0100 Encounter for examination of eyes and vision without abnormal findings: Secondary | ICD-10-CM | POA: Diagnosis not present

## 2015-08-29 ENCOUNTER — Encounter: Payer: Self-pay | Admitting: Internal Medicine

## 2015-08-29 DIAGNOSIS — O99213 Obesity complicating pregnancy, third trimester: Secondary | ICD-10-CM | POA: Diagnosis not present

## 2015-08-29 DIAGNOSIS — Z36 Encounter for antenatal screening of mother: Secondary | ICD-10-CM | POA: Diagnosis not present

## 2015-08-29 DIAGNOSIS — Z3A35 35 weeks gestation of pregnancy: Secondary | ICD-10-CM | POA: Diagnosis not present

## 2015-08-29 LAB — OB RESULTS CONSOLE GBS: STREP GROUP B AG: NEGATIVE

## 2015-09-03 DIAGNOSIS — O99213 Obesity complicating pregnancy, third trimester: Secondary | ICD-10-CM | POA: Diagnosis not present

## 2015-09-03 DIAGNOSIS — Z3A36 36 weeks gestation of pregnancy: Secondary | ICD-10-CM | POA: Diagnosis not present

## 2015-09-04 DIAGNOSIS — Z3A36 36 weeks gestation of pregnancy: Secondary | ICD-10-CM | POA: Diagnosis not present

## 2015-09-04 DIAGNOSIS — O479 False labor, unspecified: Secondary | ICD-10-CM | POA: Diagnosis not present

## 2015-09-09 DIAGNOSIS — Z3A37 37 weeks gestation of pregnancy: Secondary | ICD-10-CM | POA: Diagnosis not present

## 2015-09-09 DIAGNOSIS — O99213 Obesity complicating pregnancy, third trimester: Secondary | ICD-10-CM | POA: Diagnosis not present

## 2015-09-13 ENCOUNTER — Encounter (HOSPITAL_COMMUNITY): Payer: Self-pay

## 2015-09-13 ENCOUNTER — Inpatient Hospital Stay (HOSPITAL_COMMUNITY)
Admission: AD | Admit: 2015-09-13 | Discharge: 2015-09-13 | Disposition: A | Discharge status: Home / Self Care | Payer: 59 | Source: Ambulatory Visit | Attending: Obstetrics and Gynecology | Admitting: Obstetrics and Gynecology

## 2015-09-13 DIAGNOSIS — Z833 Family history of diabetes mellitus: Secondary | ICD-10-CM | POA: Diagnosis not present

## 2015-09-13 DIAGNOSIS — O26893 Other specified pregnancy related conditions, third trimester: Secondary | ICD-10-CM | POA: Diagnosis not present

## 2015-09-13 DIAGNOSIS — R300 Dysuria: Secondary | ICD-10-CM | POA: Diagnosis not present

## 2015-09-13 DIAGNOSIS — Z3A38 38 weeks gestation of pregnancy: Secondary | ICD-10-CM | POA: Insufficient documentation

## 2015-09-13 DIAGNOSIS — Z8249 Family history of ischemic heart disease and other diseases of the circulatory system: Secondary | ICD-10-CM | POA: Insufficient documentation

## 2015-09-13 DIAGNOSIS — O2243 Hemorrhoids in pregnancy, third trimester: Secondary | ICD-10-CM | POA: Insufficient documentation

## 2015-09-13 DIAGNOSIS — O479 False labor, unspecified: Secondary | ICD-10-CM

## 2015-09-13 DIAGNOSIS — R109 Unspecified abdominal pain: Secondary | ICD-10-CM | POA: Diagnosis present

## 2015-09-13 DIAGNOSIS — O471 False labor at or after 37 completed weeks of gestation: Secondary | ICD-10-CM | POA: Insufficient documentation

## 2015-09-13 LAB — URINALYSIS, ROUTINE W REFLEX MICROSCOPIC
BILIRUBIN URINE: NEGATIVE
Glucose, UA: NEGATIVE mg/dL
HGB URINE DIPSTICK: NEGATIVE
KETONES UR: NEGATIVE mg/dL
Nitrite: NEGATIVE
Protein, ur: NEGATIVE mg/dL
SPECIFIC GRAVITY, URINE: 1.01 (ref 1.005–1.030)
pH: 6.5 (ref 5.0–8.0)

## 2015-09-13 LAB — URINE MICROSCOPIC-ADD ON: RBC / HPF: NONE SEEN RBC/hpf (ref 0–5)

## 2015-09-13 MED ORDER — PRAMOXINE HCL 1 % RE FOAM: 1.0000 "application " | Freq: Three times a day (TID) | RECTAL | Status: DC | PRN

## 2015-09-13 NOTE — Progress Notes (Signed)
Patient states she is having urinary symptoms including increased frequency, dysuria, and retention.  Will obtain urine sample.

## 2015-09-13 NOTE — MAU Provider Note (Signed)
Chief Complaint:  Contractions   First Provider Initiated Contact with Patient 09/13/15 1617      HPI: Cheryl Hampton is a 30 y.o. G2P1001 at [redacted]w[redacted]d who presents to maternity admissions reporting cramping/contractions x 2- 3 hours. She also reports dysuria, but reports it may be pain from her hemorrhoids and not related to urine.  Nothing makes her pain better or worse, it is unaffected by position change, increased PO fluids, and rest. She reports good fetal movement, denies LOF, vaginal bleeding, vaginal itching/burning, urinary symptoms, h/a, dizziness, n/v, or fever/chills.    HPI  Past Medical History: Past Medical History  Diagnosis Date  . Depression   . Migraine headache   . Hypothyroid 2012 dx    post partum hyperthyroid    Past obstetric history: OB History  Gravida Para Term Preterm AB SAB TAB Ectopic Multiple Living  2 1 1       1     # Outcome Date GA Lbr Len/2nd Weight Sex Delivery Anes PTL Lv  2 Current           1 Term               Past Surgical History: Past Surgical History  Procedure Laterality Date  . Removal (r) fallopian tube  09/2009    Family History: Family History  Problem Relation Age of Onset  . Alcohol abuse Maternal Uncle   . Pancreatic cancer Maternal Grandmother   . Stroke Maternal Grandfather   . Hypertension Mother   . Diabetes Mother   . Hyperlipidemia Mother     Social History: Social History  Substance Use Topics  . Smoking status: Never Smoker   . Smokeless tobacco: None  . Alcohol Use: No    Allergies: No Known Allergies  Meds:  Prescriptions prior to admission  Medication Sig Dispense Refill Last Dose  . acetaminophen (TYLENOL) 325 MG tablet Take 650 mg by mouth every 6 (six) hours as needed for mild pain or headache.   09/13/2015 at Unknown time  . phenylephrine-shark liver oil-mineral oil-petrolatum (PREPARATION H) 0.25-3-14-71.9 % rectal ointment Place 1 application rectally 2 (two) times daily as needed for  hemorrhoids.   09/12/2015 at Unknown time  . Prenatal Vit-Fe Fumarate-FA (PRENATAL MULTIVITAMIN) TABS tablet Take 1 tablet by mouth daily at 12 noon.   Past Month at Unknown time  . promethazine (PHENERGAN) 25 MG tablet Take 0.5-1 tablets (12.5-25 mg total) by mouth at bedtime as needed for nausea or vomiting. 30 tablet 2 09/12/2015 at Unknown time  . witch hazel-glycerin (TUCKS) pad Apply 1 application topically as needed for itching.   09/12/2015 at Unknown time  . butalbital-acetaminophen-caffeine (FIORICET, ESGIC) 50-325-40 MG per tablet Take 1 tablet by mouth every 4 (four) hours as needed for headache. 90 tablet 1 prn  . [DISCONTINUED] ALPRAZolam (XANAX) 0.25 MG tablet TAKE 1 TABLET BY MOUTH 3 TIMES DAILY AS NEEDED FOR SLEEP 90 tablet 1 Taking    ROS:  Review of Systems  Constitutional: Negative for fever, chills and fatigue.  Eyes: Negative for visual disturbance.  Respiratory: Negative for shortness of breath.   Cardiovascular: Negative for chest pain.  Gastrointestinal: Positive for abdominal pain and rectal pain. Negative for nausea and vomiting.  Genitourinary: Positive for dysuria. Negative for flank pain, vaginal bleeding, vaginal discharge, difficulty urinating, vaginal pain and pelvic pain.  Neurological: Negative for dizziness and headaches.  Psychiatric/Behavioral: Negative.      I have reviewed patient's Past Medical Hx, Surgical Hx, Family Hx,  Social Hx, medications and allergies.   Physical Exam  Patient Vitals for the past 24 hrs:  BP Temp Temp src Pulse Resp Height Weight  09/13/15 1624 126/75 mmHg - - 80 16 - -  09/13/15 1330 127/80 mmHg - - 84 16 - -  09/13/15 1314 135/86 mmHg 97.7 F (36.5 C) Oral 89 16 5\' 4"  (1.626 m) 244 lb 12.8 oz (111.041 kg)   Constitutional: Well-developed, well-nourished female in no acute distress.  Cardiovascular: normal rate Respiratory: normal effort GI: Abd soft, non-tender, gravid appropriate for gestational age.  MS: Extremities  nontender, no edema, normal ROM Neurologic: Alert and oriented x 4.  GU: Neg CVAT.  PELVIC EXAM: Cervix pink, visually closed, without lesion, scant white creamy discharge, vaginal walls and external genitalia normal Bimanual exam: Cervix 0/long/high, firm, anterior, neg CMT, uterus nontender, nonenlarged, adnexa without tenderness, enlargement, or mass  Dilation: 3 Effacement (%): 50 Cervical Position: Middle Station: -2 Presentation: Vertex Exam by:: L. Leftwich-Kirby, CNM  FHT:  Baseline 135 , moderate variability, accelerations present, no decelerations Contractions: q2-6 minutes, mild to palpation   Labs: Results for orders placed or performed during the hospital encounter of 09/13/15 (from the past 24 hour(s))  Urinalysis, Routine w reflex microscopic (not at [facility])     Status: Abnormal   Collection Time: 09/13/15  1:50 PM  Result Value Ref Range   Color, Urine YELLOW YELLOW   APPearance CLEAR CLEAR   Specific Gravity, Urine 1.010 1.005 - 1.030   pH 6.5 5.0 - 8.0   Glucose, UA NEGATIVE NEGATIVE mg/dL   Hgb urine dipstick NEGATIVE NEGATIVE   Bilirubin Urine NEGATIVE NEGATIVE   Ketones, ur NEGATIVE NEGATIVE mg/dL   Protein, ur NEGATIVE NEGATIVE mg/dL   Nitrite NEGATIVE NEGATIVE   Leukocytes, UA MODERATE (A) NEGATIVE  Urine microscopic-add on     Status: Abnormal   Collection Time: 09/13/15  1:50 PM  Result Value Ref Range   Squamous Epithelial / LPF 6-30 (A) NONE SEEN   WBC, UA 6-30 0 - 5 WBC/hpf   RBC / HPF NONE SEEN 0 - 5 RBC/hpf   Bacteria, UA MANY (A) NONE SEEN      Imaging:  No results found.  MAU Course/MDM: I have ordered labs and reviewed results.  No signs of labor with unchanged cervix in 2 hours in MAU.  Consult Dr Sandford Craze.  Treat hemorrhoid with Proctofoam.  Urine sent for culture.  Labor precautions given. Pt stable at time of discharge.  Assessment: 1. Threatened labor at term   2. Hemorrhoids during pregnancy in third trimester, antepartum    3. Dysuria     Plan: Discharge home Labor precautions and fetal kick counts Proctofoam Rx sent to pharmacy  Follow-up Information    Follow up with Janyth Contes, MD.   Specialty:  Obstetrics and Gynecology   Why:  As scheduled on Wednesday, Return to MAU as needed for emergencies   Contact information:   [address] [place] [address] [phone]        Medication List    TAKE these medications        acetaminophen 325 MG tablet  Commonly known as:  TYLENOL  Take 650 mg by mouth every 6 (six) hours as needed for mild pain or headache.     butalbital-acetaminophen-caffeine 50-325-40 MG tablet  Commonly known as:  FIORICET, ESGIC  Take 1 tablet by mouth every 4 (four) hours as needed for headache.     phenylephrine-shark liver  oil-mineral oil-petrolatum 0.25-3-14-71.9 % rectal ointment  Commonly known as:  PREPARATION H  Place 1 application rectally 2 (two) times daily as needed for hemorrhoids.     pramoxine 1 % foam  Commonly known as:  PROCTOFOAM  Place 1 application rectally 3 (three) times daily as needed for itching.     prenatal multivitamin Tabs tablet  Take 1 tablet by mouth daily at 12 noon.     promethazine 25 MG tablet  Commonly known as:  PHENERGAN  Take 0.5-1 tablets (12.5-25 mg total) by mouth at bedtime as needed for nausea or vomiting.     witch hazel-glycerin pad  Commonly known as:  TUCKS  Apply 1 application topically as needed for itching.        Fatima Blank Certified Nurse-Midwife 09/13/2015 4:31 PM

## 2015-09-13 NOTE — Discharge Instructions (Signed)
Reasons to return to MAU:  1.  Contractions are  5 minutes apart or less, each last 1 minute, these have been going on for 1-2 hours, and you cannot walk or talk during them 2.  You have a large gush of fluid, or a trickle of fluid that will not stop and you have to wear a pad 3.  You have bleeding that is bright red, heavier than spotting--like menstrual bleeding (spotting can be normal in early labor or after a check of your cervix) 4.  You do not feel the baby moving like he/she normally does  Hemorrhoids Hemorrhoids are swollen veins around the rectum or anus. There are two types of hemorrhoids:   Internal hemorrhoids. These occur in the veins just inside the rectum. They may poke through to the outside and become irritated and painful.  External hemorrhoids. These occur in the veins outside the anus and can be felt as a painful swelling or hard lump near the anus. CAUSES  Pregnancy.   Obesity.   Constipation or diarrhea.   Straining to have a bowel movement.   Sitting for long periods on the toilet.  Heavy lifting or other activity that caused you to strain.  Anal intercourse. SYMPTOMS   Pain.   Anal itching or irritation.   Rectal bleeding.   Fecal leakage.   Anal swelling.   One or more lumps around the anus.  DIAGNOSIS  Your caregiver may be able to diagnose hemorrhoids by visual examination. Other examinations or tests that may be performed include:   Examination of the rectal area with a gloved hand (digital rectal exam).   Examination of anal canal using a small tube (scope).   A blood test if you have lost a significant amount of blood.  A test to look inside the colon (sigmoidoscopy or colonoscopy). TREATMENT Most hemorrhoids can be treated at home. However, if symptoms do not seem to be getting better or if you have a lot of rectal bleeding, your caregiver may perform a procedure to help make the hemorrhoids get smaller or remove them  completely. Possible treatments include:   Placing a rubber band at the base of the hemorrhoid to cut off the circulation (rubber band ligation).   Injecting a chemical to shrink the hemorrhoid (sclerotherapy).   Using a tool to burn the hemorrhoid (infrared light therapy).   Surgically removing the hemorrhoid (hemorrhoidectomy).   Stapling the hemorrhoid to block blood flow to the tissue (hemorrhoid stapling).  HOME CARE INSTRUCTIONS   Eat foods with fiber, such as whole grains, beans, nuts, fruits, and vegetables. Ask your doctor about taking products with added fiber in them (fibersupplements).  Increase fluid intake. Drink enough water and fluids to keep your urine clear or pale yellow.   Exercise regularly.   Go to the bathroom when you have the urge to have a bowel movement. Do not wait.   Avoid straining to have bowel movements.   Keep the anal area dry and clean. Use wet toilet paper or moist towelettes after a bowel movement.   Medicated creams and suppositories may be used or applied as directed.   Only take over-the-counter or prescription medicines as directed by your caregiver.   Take warm sitz baths for 15-20 minutes, 3-4 times a day to ease pain and discomfort.   Place ice packs on the hemorrhoids if they are tender and swollen. Using ice packs between sitz baths may be helpful.   Put ice in a plastic bag.  Place a towel between your skin and the bag.   Leave the ice on for 15-20 minutes, 3-4 times a day.   Do not use a donut-shaped pillow or sit on the toilet for long periods. This increases blood pooling and pain.  SEEK MEDICAL CARE IF:  You have increasing pain and swelling that is not controlled by treatment or medicine.  You have uncontrolled bleeding.  You have difficulty or you are unable to have a bowel movement.  You have pain or inflammation outside the area of the hemorrhoids. MAKE SURE YOU:  Understand these  instructions.  Will watch your condition.  Will get help right away if you are not doing well or get worse.   This information is not intended to replace advice given to you by your health care provider. Make sure you discuss any questions you have with your health care provider.   Document Released: 06/05/2000 Document Revised: 05/25/2012 Document Reviewed: 04/12/2012 Elsevier Interactive Patient Education [employer].

## 2015-09-13 NOTE — MAU Note (Signed)
Consistent contractions since this morning around 2:00 am, mucus discharge with some specs of blood, no LOF

## 2015-09-16 LAB — CULTURE, OB URINE

## 2015-09-18 ENCOUNTER — Encounter: Payer: Self-pay | Admitting: Internal Medicine

## 2015-09-18 DIAGNOSIS — Z3A38 38 weeks gestation of pregnancy: Secondary | ICD-10-CM | POA: Diagnosis not present

## 2015-09-18 DIAGNOSIS — O99213 Obesity complicating pregnancy, third trimester: Secondary | ICD-10-CM | POA: Diagnosis not present

## 2015-09-20 ENCOUNTER — Telehealth (HOSPITAL_COMMUNITY): Payer: Self-pay | Admitting: *Deleted

## 2015-09-20 ENCOUNTER — Encounter (HOSPITAL_COMMUNITY): Payer: Self-pay | Admitting: *Deleted

## 2015-09-20 NOTE — Telephone Encounter (Signed)
Preadmission screen  

## 2015-09-23 ENCOUNTER — Encounter (HOSPITAL_COMMUNITY): Payer: Self-pay | Admitting: *Deleted

## 2015-09-23 ENCOUNTER — Encounter (HOSPITAL_COMMUNITY): Payer: Self-pay

## 2015-09-23 ENCOUNTER — Inpatient Hospital Stay (HOSPITAL_COMMUNITY)
Admission: AD | Admit: 2015-09-23 | Discharge: 2015-09-26 | DRG: 775 | Disposition: A | Discharge status: Home / Self Care | Payer: 59 | Source: Ambulatory Visit | Attending: Obstetrics and Gynecology | Admitting: Obstetrics and Gynecology

## 2015-09-23 ENCOUNTER — Inpatient Hospital Stay (HOSPITAL_COMMUNITY)
Admission: AD | Admit: 2015-09-23 | Discharge: 2015-09-23 | Disposition: A | Discharge status: Home / Self Care | Payer: 59 | Source: Ambulatory Visit | Attending: Obstetrics and Gynecology | Admitting: Obstetrics and Gynecology

## 2015-09-23 DIAGNOSIS — O99214 Obesity complicating childbirth: Secondary | ICD-10-CM | POA: Diagnosis present

## 2015-09-23 DIAGNOSIS — O99284 Endocrine, nutritional and metabolic diseases complicating childbirth: Secondary | ICD-10-CM | POA: Diagnosis present

## 2015-09-23 DIAGNOSIS — E039 Hypothyroidism, unspecified: Secondary | ICD-10-CM | POA: Diagnosis present

## 2015-09-23 DIAGNOSIS — Z800 Family history of malignant neoplasm of digestive organs: Secondary | ICD-10-CM

## 2015-09-23 DIAGNOSIS — IMO0001 Reserved for inherently not codable concepts without codable children: Secondary | ICD-10-CM

## 2015-09-23 DIAGNOSIS — Z6841 Body Mass Index (BMI) 40.0 and over, adult: Secondary | ICD-10-CM

## 2015-09-23 DIAGNOSIS — Z3A39 39 weeks gestation of pregnancy: Secondary | ICD-10-CM | POA: Diagnosis not present

## 2015-09-23 DIAGNOSIS — Z823 Family history of stroke: Secondary | ICD-10-CM | POA: Diagnosis not present

## 2015-09-23 DIAGNOSIS — O4202 Full-term premature rupture of membranes, onset of labor within 24 hours of rupture: Secondary | ICD-10-CM | POA: Diagnosis present

## 2015-09-23 DIAGNOSIS — O99344 Other mental disorders complicating childbirth: Secondary | ICD-10-CM | POA: Diagnosis present

## 2015-09-23 DIAGNOSIS — Z8249 Family history of ischemic heart disease and other diseases of the circulatory system: Secondary | ICD-10-CM

## 2015-09-23 DIAGNOSIS — F329 Major depressive disorder, single episode, unspecified: Secondary | ICD-10-CM | POA: Diagnosis present

## 2015-09-23 DIAGNOSIS — O99213 Obesity complicating pregnancy, third trimester: Secondary | ICD-10-CM | POA: Diagnosis not present

## 2015-09-23 DIAGNOSIS — Z833 Family history of diabetes mellitus: Secondary | ICD-10-CM

## 2015-09-23 LAB — AMNISURE RUPTURE OF MEMBRANE (ROM) NOT AT ARMC: Amnisure ROM: NEGATIVE

## 2015-09-23 LAB — POCT FERN TEST: POCT Fern Test: NEGATIVE

## 2015-09-23 MED ORDER — LACTATED RINGERS IV SOLN
500.0000 mL | INTRAVENOUS | Status: DC | PRN
  Administered 2015-09-24 (×2): 500 mL via INTRAVENOUS

## 2015-09-23 MED ORDER — ACETAMINOPHEN 325 MG PO TABS: 650.0000 mg | ORAL_TABLET | ORAL | Status: DC | PRN

## 2015-09-23 MED ORDER — FLEET ENEMA 7-19 GM/118ML RE ENEM: 1.0000 | ENEMA | RECTAL | Status: DC | PRN

## 2015-09-23 MED ORDER — LACTATED RINGERS IV SOLN
INTRAVENOUS | Status: DC
  Administered 2015-09-23: via INTRAVENOUS

## 2015-09-23 MED ORDER — OXYCODONE-ACETAMINOPHEN 5-325 MG PO TABS: 2.0000 | ORAL_TABLET | ORAL | Status: DC | PRN

## 2015-09-23 MED ORDER — OXYTOCIN BOLUS FROM INFUSION
500.0000 mL | INTRAVENOUS | Status: DC
  Administered 2015-09-24: 500 mL via INTRAVENOUS

## 2015-09-23 MED ORDER — OXYCODONE-ACETAMINOPHEN 5-325 MG PO TABS: 1.0000 | ORAL_TABLET | ORAL | Status: DC | PRN

## 2015-09-23 MED ORDER — CITRIC ACID-SODIUM CITRATE 334-500 MG/5ML PO SOLN: 30.0000 mL | ORAL | Status: DC | PRN

## 2015-09-23 MED ORDER — ONDANSETRON HCL 4 MG/2ML IJ SOLN: 4.0000 mg | Freq: Four times a day (QID) | INTRAMUSCULAR | Status: DC | PRN

## 2015-09-23 MED ORDER — OXYTOCIN 10 UNIT/ML IJ SOLN
2.5000 [IU]/h | INTRAVENOUS | Status: DC
  Filled 2015-09-23: qty 4

## 2015-09-23 MED ORDER — LIDOCAINE HCL (PF) 1 % IJ SOLN
30.0000 mL | INTRAMUSCULAR | Status: DC | PRN
  Filled 2015-09-23: qty 30

## 2015-09-23 NOTE — Discharge Instructions (Signed)
Hypertension During Pregnancy °Hypertension, or high blood pressure, is when there is extra pressure inside your blood vessels that carry blood from the heart to the rest of your body (arteries). It can happen at any time in life, including pregnancy. Hypertension during pregnancy can cause problems for you and your baby. Your baby might not weigh as much as he or she should at birth or might be born early (premature). Very bad cases of hypertension during pregnancy can be life-threatening.  °Different types of hypertension can occur during pregnancy. These include: °· Chronic hypertension. This happens when a woman has hypertension before pregnancy and it continues during pregnancy. °· Gestational hypertension. This is when hypertension develops during pregnancy. °· Preeclampsia or toxemia of pregnancy. This is a very serious type of hypertension that develops only during pregnancy. It affects the whole body and can be very dangerous for both mother and baby.   °Gestational hypertension and preeclampsia usually go away after your baby is born. Your blood pressure will likely stabilize within 6 weeks. Women who have hypertension during pregnancy have a greater chance of developing hypertension later in life or with future pregnancies. °RISK FACTORS °There are certain factors that make it more likely for you to develop hypertension during pregnancy. These include: °1. Having hypertension before pregnancy. °2. Having hypertension during a previous pregnancy. °3. Being overweight. °4. Being older than 40 years. °5. Being pregnant with more than one baby. °6. Having diabetes or kidney problems. °SIGNS AND SYMPTOMS °Chronic and gestational hypertension rarely cause symptoms. Preeclampsia has symptoms, which may include: °· Increased protein in your urine. Your health care provider will check for this at every prenatal visit. °· Swelling of your hands and face. °· Rapid weight gain. °· Headaches. °· Visual  changes. °· Being bothered by light. °· Abdominal pain, especially in the upper right area. °· Chest pain. °· Shortness of breath. °· Increased reflexes. °· Seizures. These occur with a more severe form of preeclampsia, called eclampsia. °DIAGNOSIS  °You may be diagnosed with hypertension during a regular prenatal exam. At each prenatal visit, you may have: °· Your blood pressure checked. °· A urine test to check for protein in your urine. °The type of hypertension you are diagnosed with depends on when you developed it. It also depends on your specific blood pressure reading. °· Developing hypertension before 20 weeks of pregnancy is consistent with chronic hypertension. °· Developing hypertension after 20 weeks of pregnancy is consistent with gestational hypertension. °· Hypertension with increased urinary protein is diagnosed as preeclampsia. °· Blood pressure measurements that stay above 160 systolic or 110 diastolic are a sign of severe preeclampsia. °TREATMENT °Treatment for hypertension during pregnancy varies. Treatment depends on the type of hypertension and how serious it is. °· If you take medicine for chronic hypertension, you may need to switch medicines. °¨ Medicines called ACE inhibitors should not be taken during pregnancy. °¨ Low-dose aspirin may be suggested for women who have risk factors for preeclampsia. °· If you have gestational hypertension, you may need to take a blood pressure medicine that is safe during pregnancy. Your health care provider will recommend the correct medicine. °· If you have severe preeclampsia, you may need to be in the hospital. Health care providers will watch you and your baby very closely. You also may need to take medicine called magnesium sulfate to prevent seizures and lower blood pressure. °· Sometimes, an early delivery is needed. This may be the case if the condition worsens. It would be   done to protect you and your baby. The only cure for preeclampsia is  delivery.  Your health care provider may recommend that you take one low-dose aspirin (81 mg) each day to help prevent high blood pressure during your pregnancy if you are at risk for preeclampsia. You may be at risk for preeclampsia if:  You had preeclampsia or eclampsia during a previous pregnancy.  Your baby did not grow as expected during a previous pregnancy.  You experienced preterm birth with a previous pregnancy.  You experienced a separation of the placenta from the uterus (placental abruption) during a previous pregnancy.  You experienced the loss of your baby during a previous pregnancy.  You are pregnant with more than one baby.  You have other medical conditions, such as diabetes or an autoimmune disease. HOME CARE INSTRUCTIONS  Schedule and keep all of your regular prenatal care appointments. This is important.  Take medicines only as directed by your health care provider. Tell your health care provider about all medicines you take.  Eat as little salt as possible.  Get regular exercise.  Do not drink alcohol.  Do not use tobacco products.  Do not drink products with caffeine.  Lie on your left side when resting. SEEK IMMEDIATE MEDICAL CARE IF:  You have severe abdominal pain.  You have sudden swelling in your hands, ankles, or face.  You gain 4 pounds (1.8 kg) or more in 1 week.  You vomit repeatedly.  You have vaginal bleeding.  You do not feel your baby moving as much.  You have a headache.  You have blurred or double vision.  You have muscle twitching or spasms.  You have shortness of breath.  You have blue fingernails or lips.  You have blood in your urine. MAKE SURE YOU:  Understand these instructions.  Will watch your condition.  Will get help right away if you are not doing well or get worse.   This information is not intended to replace advice given to you by your health care provider. Make sure you discuss any questions you  have with your health care provider.   Document Released: 02/24/2011 Document Revised: 06/29/2014 Document Reviewed: 01/05/2013 Elsevier Interactive Patient Education 2016 [place]. Fetal Movement Counts Patient Name: __________________________________________________ Patient Due Date: ____________________ Performing a fetal movement count is highly recommended in high-risk pregnancies, but it is good for every pregnant woman to do. Your health care provider may ask you to start counting fetal movements at 28 weeks of the pregnancy. Fetal movements often increase:  After eating a full meal.  After physical activity.  After eating or drinking something sweet or cold.  At rest. Pay attention to when you feel the baby is most active. This will help you notice a pattern of your baby's sleep and wake cycles and what factors contribute to an increase in fetal movement. It is important to perform a fetal movement count at the same time each day when your baby is normally most active.  HOW TO COUNT FETAL MOVEMENTS 7. Find a quiet and comfortable area to sit or lie down on your left side. Lying on your left side provides the best blood and oxygen circulation to your baby. 8. Write down the day and time on a sheet of paper or in a journal. 9. Start counting kicks, flutters, swishes, rolls, or jabs in a 2-hour period. You should feel at least 10 movements within 2 hours. 10. If you do not feel 10 movements in 2  hours, wait 2-3 hours and count again. Look for a change in the pattern or not enough counts in 2 hours. SEEK MEDICAL CARE IF:  You feel less than 10 counts in 2 hours, tried twice.  There is no movement in over an hour.  The pattern is changing or taking longer each day to reach 10 counts in 2 hours.  You feel the baby is not moving as he or she usually does. Date: ____________ Movements: ____________ Start time: ____________ Cheryl Hampton time: ____________  Date: ____________ Movements:  ____________ Start time: ____________ Cheryl Hampton time: ____________ Date: ____________ Movements: ____________ Start time: ____________ Cheryl Hampton time: ____________ Date: ____________ Movements: ____________ Start time: ____________ Cheryl Hampton time: ____________ Date: ____________ Movements: ____________ Start time: ____________ Cheryl Hampton time: ____________ Date: ____________ Movements: ____________ Start time: ____________ Cheryl Hampton time: ____________ Date: ____________ Movements: ____________ Start time: ____________ Cheryl Hampton time: ____________ Date: ____________ Movements: ____________ Start time: ____________ Cheryl Hampton time: ____________  Date: ____________ Movements: ____________ Start time: ____________ Cheryl Hampton time: ____________ Date: ____________ Movements: ____________ Start time: ____________ Cheryl Hampton time: ____________ Date: ____________ Movements: ____________ Start time: ____________ Cheryl Hampton time: ____________ Date: ____________ Movements: ____________ Start time: ____________ Cheryl Hampton time: ____________ Date: ____________ Movements: ____________ Start time: ____________ Cheryl Hampton time: ____________ Date: ____________ Movements: ____________ Start time: ____________ Cheryl Hampton time: ____________ Date: ____________ Movements: ____________ Start time: ____________ Cheryl Hampton time: ____________  Date: ____________ Movements: ____________ Start time: ____________ Cheryl Hampton time: ____________ Date: ____________ Movements: ____________ Start time: ____________ Cheryl Hampton time: ____________ Date: ____________ Movements: ____________ Start time: ____________ Cheryl Hampton time: ____________ Date: ____________ Movements: ____________ Start time: ____________ Cheryl Hampton time: ____________ Date: ____________ Movements: ____________ Start time: ____________ Cheryl Hampton time: ____________ Date: ____________ Movements: ____________ Start time: ____________ Cheryl Hampton time: ____________ Date: ____________ Movements: ____________ Start time: ____________ Cheryl Hampton  time: ____________  Date: ____________ Movements: ____________ Start time: ____________ Cheryl Hampton time: ____________ Date: ____________ Movements: ____________ Start time: ____________ Cheryl Hampton time: ____________ Date: ____________ Movements: ____________ Start time: ____________ Cheryl Hampton time: ____________ Date: ____________ Movements: ____________ Start time: ____________ Cheryl Hampton time: ____________ Date: ____________ Movements: ____________ Start time: ____________ Cheryl Hampton time: ____________ Date: ____________ Movements: ____________ Start time: ____________ Cheryl Hampton time: ____________ Date: ____________ Movements: ____________ Start time: ____________ Cheryl Hampton time: ____________  Date: ____________ Movements: ____________ Start time: ____________ Cheryl Hampton time: ____________ Date: ____________ Movements: ____________ Start time: ____________ Cheryl Hampton time: ____________ Date: ____________ Movements: ____________ Start time: ____________ Cheryl Hampton time: ____________ Date: ____________ Movements: ____________ Start time: ____________ Cheryl Hampton time: ____________ Date: ____________ Movements: ____________ Start time: ____________ Cheryl Hampton time: ____________ Date: ____________ Movements: ____________ Start time: ____________ Cheryl Hampton time: ____________ Date: ____________ Movements: ____________ Start time: ____________ Cheryl Hampton time: ____________  Date: ____________ Movements: ____________ Start time: ____________ Cheryl Hampton time: ____________ Date: ____________ Movements: ____________ Start time: ____________ Cheryl Hampton time: ____________ Date: ____________ Movements: ____________ Start time: ____________ Cheryl Hampton time: ____________ Date: ____________ Movements: ____________ Start time: ____________ Cheryl Hampton time: ____________ Date: ____________ Movements: ____________ Start time: ____________ Cheryl Hampton time: ____________ Date: ____________ Movements: ____________ Start time: ____________ Cheryl Hampton time: ____________ Date: ____________  Movements: ____________ Start time: ____________ Cheryl Hampton time: ____________  Date: ____________ Movements: ____________ Start time: ____________ Cheryl Hampton time: ____________ Date: ____________ Movements: ____________ Start time: ____________ Cheryl Hampton time: ____________ Date: ____________ Movements: ____________ Start time: ____________ Cheryl Hampton time: ____________ Date: ____________ Movements: ____________ Start time: ____________ Cheryl Hampton time: ____________ Date: ____________ Movements: ____________ Start time: ____________ Cheryl Hampton time: ____________ Date: ____________ Movements: ____________ Start time: ____________ Cheryl Hampton time: ____________ Date: ____________ Movements: ____________ Start time: ____________ Cheryl Hampton time: ____________  Date: ____________ Movements: ____________ Start time: ____________ Cheryl Hampton time: ____________ Date: ____________  Movements: ____________ Start time: ____________ Cheryl Hampton time: ____________ Date: ____________ Movements: ____________ Start time: ____________ Cheryl Hampton time: ____________ Date: ____________ Movements: ____________ Start time: ____________ Cheryl Hampton time: ____________ Date: ____________ Movements: ____________ Start time: ____________ Cheryl Hampton time: ____________ Date: ____________ Movements: ____________ Start time: ____________ Cheryl Hampton time: ____________   This information is not intended to replace advice given to you by your health care provider. Make sure you discuss any questions you have with your health care provider.   Document Released: 07/08/2006 Document Revised: 06/29/2014 Document Reviewed: 04/04/2012 Elsevier Interactive Patient Education 2016 [employer] of the uterus can occur throughout pregnancy. Contractions are not always a sign that you are in labor.  WHAT ARE BRAXTON HICKS CONTRACTIONS?  Contractions that occur before labor are called Braxton Hicks contractions, or false labor. Toward the end of pregnancy  (32-34 weeks), these contractions can develop more often and may become more forceful. This is not true labor because these contractions do not result in opening (dilatation) and thinning of the cervix. They are sometimes difficult to tell apart from true labor because these contractions can be forceful and people have different pain tolerances. You should not feel embarrassed if you go to the hospital with false labor. Sometimes, the only way to tell if you are in true labor is for your health care provider to look for changes in the cervix. If there are no prenatal problems or other health problems associated with the pregnancy, it is completely safe to be sent home with false labor and await the onset of true labor. HOW CAN YOU TELL THE DIFFERENCE BETWEEN TRUE AND FALSE LABOR? False Labor  The contractions of false labor are usually shorter and not as hard as those of true labor.   The contractions are usually irregular.   The contractions are often felt in the front of the lower abdomen and in the groin.   The contractions may go away when you walk around or change positions while lying down.   The contractions get weaker and are shorter lasting as time goes on.   The contractions do not usually become progressively stronger, regular, and closer together as with true labor.  True Labor 11. Contractions in true labor last 30-70 seconds, become very regular, usually become more intense, and increase in frequency.  12. The contractions do not go away with walking.  13. The discomfort is usually felt in the top of the uterus and spreads to the lower abdomen and low back.  14. True labor can be determined by your health care provider with an exam. This will show that the cervix is dilating and getting thinner.  WHAT TO REMEMBER  Keep up with your usual exercises and follow other instructions given by your health care provider.   Take medicines as directed by your health care  provider.   Keep your regular prenatal appointments.   Eat and drink lightly if you think you are going into labor.   If Braxton Hicks contractions are making you uncomfortable:   Change your position from lying down or resting to walking, or from walking to resting.   Sit and rest in a tub of warm water.   Drink 2-3 glasses of water. Dehydration may cause these contractions.   Do slow and deep breathing several times an hour.  WHEN SHOULD I SEEK IMMEDIATE MEDICAL CARE? Seek immediate medical care if:  Your contractions become stronger, more regular, and closer together.   You have fluid leaking or  gushing from your vagina.   You have a fever.   You pass blood-tinged mucus.   You have vaginal bleeding.   You have continuous abdominal pain.   You have low back pain that you never had before.   You feel your baby's head pushing down and causing pelvic pressure.   Your baby is not moving as much as it used to.    This information is not intended to replace advice given to you by your health care provider. Make sure you discuss any questions you have with your health care provider.   Document Released: 06/08/2005 Document Revised: 06/13/2013 Document Reviewed: 03/20/2013 Elsevier Interactive Patient Education [employer].

## 2015-09-23 NOTE — MAU Note (Signed)
Pt reports leaking fluid since 2100, contractions.

## 2015-09-23 NOTE — MAU Note (Signed)
Pt reports ROM at 0400, contractions q 7-12 minutes

## 2015-09-24 ENCOUNTER — Encounter (HOSPITAL_COMMUNITY): Payer: Self-pay | Admitting: Anesthesiology

## 2015-09-24 ENCOUNTER — Inpatient Hospital Stay (HOSPITAL_COMMUNITY): Payer: 59 | Admitting: Anesthesiology

## 2015-09-24 LAB — CBC
HCT: 33.1 % — ABNORMAL LOW (ref 36.0–46.0)
Hemoglobin: 10.7 g/dL — ABNORMAL LOW (ref 12.0–15.0)
MCH: 26 pg (ref 26.0–34.0)
MCHC: 32.3 g/dL (ref 30.0–36.0)
MCV: 80.3 fL (ref 78.0–100.0)
PLATELETS: 304 10*3/uL (ref 150–400)
RBC: 4.12 MIL/uL (ref 3.87–5.11)
RDW: 15.8 % — AB (ref 11.5–15.5)
WBC: 11.1 10*3/uL — ABNORMAL HIGH (ref 4.0–10.5)

## 2015-09-24 LAB — HEPATIC FUNCTION PANEL
ALBUMIN: 2.7 g/dL — AB (ref 3.5–5.0)
ALK PHOS: 175 U/L — AB (ref 38–126)
ALT: 15 U/L (ref 14–54)
AST: 20 U/L (ref 15–41)
BILIRUBIN TOTAL: 0.1 mg/dL — AB (ref 0.3–1.2)
Bilirubin, Direct: 0.1 mg/dL (ref 0.1–0.5)
Indirect Bilirubin: 0 mg/dL — ABNORMAL LOW (ref 0.3–0.9)
Total Protein: 6.3 g/dL — ABNORMAL LOW (ref 6.5–8.1)

## 2015-09-24 LAB — TYPE AND SCREEN
ABO/RH(D): A POS
Antibody Screen: NEGATIVE

## 2015-09-24 LAB — RPR: RPR Ser Ql: NONREACTIVE

## 2015-09-24 LAB — LACTATE DEHYDROGENASE: LDH: 207 U/L — ABNORMAL HIGH (ref 98–192)

## 2015-09-24 LAB — URIC ACID: Uric Acid, Serum: 3.8 mg/dL (ref 2.3–6.6)

## 2015-09-24 MED ORDER — FENTANYL 2.5 MCG/ML BUPIVACAINE 1/10 % EPIDURAL INFUSION (WH - ANES)
14.0000 mL/h | INTRAMUSCULAR | Status: DC | PRN
  Administered 2015-09-24: 14 mL/h via EPIDURAL
  Filled 2015-09-24: qty 125

## 2015-09-24 MED ORDER — LIDOCAINE HCL (PF) 1 % IJ SOLN
INTRAMUSCULAR | Status: DC | PRN
  Administered 2015-09-24 (×2): 8 mL via EPIDURAL

## 2015-09-24 MED ORDER — PRENATAL MULTIVITAMIN CH
1.0000 | ORAL_TABLET | Freq: Every day | ORAL | Status: DC
  Filled 2015-09-24 (×2): qty 1

## 2015-09-24 MED ORDER — WITCH HAZEL-GLYCERIN EX PADS: 1.0000 "application " | MEDICATED_PAD | CUTANEOUS | Status: DC | PRN

## 2015-09-24 MED ORDER — TETANUS-DIPHTH-ACELL PERTUSSIS 5-2.5-18.5 LF-MCG/0.5 IM SUSP: 0.5000 mL | Freq: Once | INTRAMUSCULAR | Status: DC

## 2015-09-24 MED ORDER — EPHEDRINE 5 MG/ML INJ
10.0000 mg | INTRAVENOUS | Status: DC | PRN
  Filled 2015-09-24: qty 2

## 2015-09-24 MED ORDER — DIPHENHYDRAMINE HCL 50 MG/ML IJ SOLN: 12.5000 mg | INTRAMUSCULAR | Status: DC | PRN

## 2015-09-24 MED ORDER — SIMETHICONE 80 MG PO CHEW: 80.0000 mg | CHEWABLE_TABLET | ORAL | Status: DC | PRN

## 2015-09-24 MED ORDER — BUTALBITAL-APAP-CAFFEINE 50-325-40 MG PO TABS
1.0000 | ORAL_TABLET | ORAL | Status: DC | PRN
  Filled 2015-09-24: qty 1

## 2015-09-24 MED ORDER — DIBUCAINE 1 % RE OINT: 1.0000 "application " | TOPICAL_OINTMENT | RECTAL | Status: DC | PRN

## 2015-09-24 MED ORDER — DIPHENHYDRAMINE HCL 25 MG PO CAPS: 25.0000 mg | ORAL_CAPSULE | Freq: Four times a day (QID) | ORAL | Status: DC | PRN

## 2015-09-24 MED ORDER — SENNOSIDES-DOCUSATE SODIUM 8.6-50 MG PO TABS
2.0000 | ORAL_TABLET | ORAL | Status: DC
  Administered 2015-09-24 – 2015-09-25 (×2): 2 via ORAL
  Filled 2015-09-24 (×2): qty 2

## 2015-09-24 MED ORDER — ONDANSETRON HCL 4 MG PO TABS: 4.0000 mg | ORAL_TABLET | ORAL | Status: DC | PRN

## 2015-09-24 MED ORDER — ONDANSETRON HCL 4 MG/2ML IJ SOLN: 4.0000 mg | INTRAMUSCULAR | Status: DC | PRN

## 2015-09-24 MED ORDER — LANOLIN HYDROUS EX OINT: TOPICAL_OINTMENT | CUTANEOUS | Status: DC | PRN

## 2015-09-24 MED ORDER — BENZOCAINE-MENTHOL 20-0.5 % EX AERO: 1.0000 "application " | INHALATION_SPRAY | CUTANEOUS | Status: DC | PRN

## 2015-09-24 MED ORDER — LACTATED RINGERS IV SOLN: INTRAVENOUS | Status: DC

## 2015-09-24 MED ORDER — PHENYLEPHRINE 40 MCG/ML (10ML) SYRINGE FOR IV PUSH (FOR BLOOD PRESSURE SUPPORT)
80.0000 ug | PREFILLED_SYRINGE | INTRAVENOUS | Status: DC | PRN
  Filled 2015-09-24: qty 20
  Filled 2015-09-24: qty 2

## 2015-09-24 MED ORDER — TERBUTALINE SULFATE 1 MG/ML IJ SOLN
0.2500 mg | Freq: Once | INTRAMUSCULAR | Status: DC | PRN
  Filled 2015-09-24: qty 1

## 2015-09-24 MED ORDER — ZOLPIDEM TARTRATE 5 MG PO TABS: 5.0000 mg | ORAL_TABLET | Freq: Every evening | ORAL | Status: DC | PRN

## 2015-09-24 MED ORDER — LACTATED RINGERS IV SOLN
500.0000 mL | Freq: Once | INTRAVENOUS | Status: AC
  Administered 2015-09-24: 500 mL via INTRAVENOUS

## 2015-09-24 MED ORDER — LACTATED RINGERS IV SOLN
1.0000 m[IU]/min | INTRAVENOUS | Status: DC
  Administered 2015-09-24: 2 m[IU]/min via INTRAVENOUS

## 2015-09-24 MED ORDER — ACETAMINOPHEN 325 MG PO TABS
650.0000 mg | ORAL_TABLET | ORAL | Status: DC | PRN
  Administered 2015-09-24 – 2015-09-26 (×4): 650 mg via ORAL
  Filled 2015-09-24 (×4): qty 2

## 2015-09-24 MED ORDER — IBUPROFEN 600 MG PO TABS
600.0000 mg | ORAL_TABLET | Freq: Four times a day (QID) | ORAL | Status: DC
  Administered 2015-09-24 – 2015-09-26 (×8): 600 mg via ORAL
  Filled 2015-09-24 (×8): qty 1

## 2015-09-24 MED ORDER — PHENYLEPHRINE 40 MCG/ML (10ML) SYRINGE FOR IV PUSH (FOR BLOOD PRESSURE SUPPORT)
80.0000 ug | PREFILLED_SYRINGE | INTRAVENOUS | Status: DC | PRN
  Filled 2015-09-24: qty 2

## 2015-09-24 NOTE — H&P (Signed)
Cheryl Hampton is a 30 y.o. female presenting for leakage of fluid and contractions. Pt was in MAU a day ago but r/o ROM done and was 3-4cm dilated. At office appt later yesterday she was 5cm dilated. By nighttime pt called to reports persistent "gushes" of fluid and was confirmed to have clear ROM on arrival at [facility]U. Her prenatal course has been benign with dating based on lmp and confirmed by first trimester u/s. She has a history of pph in previous pregnancy. Borderline anemic with this pregnancy.  Maternal Medical History:  Reason for admission: Rupture of membranes and contractions.  Nausea.  Contractions: Onset was 13-24 hours ago.   Frequency: regular.   Duration is approximately 3 minutes.   Perceived severity is moderate.    Fetal activity: Perceived fetal activity is normal.   Last perceived fetal movement was within the past hour.    Prenatal complications: no prenatal complications Prenatal Complications - Diabetes: none.    OB History    Gravida Para Term Preterm AB TAB SAB Ectopic Multiple Living   3 1 1  1   1  1      Past Medical History  Diagnosis Date  . Migraine headache   . Hypothyroid 2012 dx    post partum hyperthyroid  . Depression     Post partum took lexapro and wellbutrin and it helped   Past Surgical History  Procedure Laterality Date  . Removal (r) fallopian tube  09/2009  . Mouth surgery     Family History: family history includes Alcohol abuse in her maternal uncle; Diabetes in her mother; Hyperlipidemia in her mother; Hypertension in her mother; Pancreatic cancer in her maternal grandmother; Stroke in her maternal grandfather. Social History:  reports that she has never smoked. She has never used smokeless tobacco. She reports that she does not drink alcohol or use illicit drugs.   Prenatal Transfer Tool  Maternal Diabetes: No Genetic Screening: Declined Maternal Ultrasounds/Referrals: Normal; EIF only Fetal Ultrasounds or other Referrals:   None Maternal Substance Abuse:  No Significant Maternal Medications:  Meds include: Other: ferrous sulfate daily Significant Maternal Lab Results:  Lab values include: Group B Strep negative Other Comments:  None  Review of Systems  Constitutional: Negative for fever, chills and malaise/fatigue.  Eyes: Negative for blurred vision.  Respiratory: Negative for shortness of breath.   Cardiovascular: Negative for chest pain and leg swelling.  Gastrointestinal: Positive for abdominal pain. Negative for heartburn, nausea and vomiting.  Musculoskeletal: Positive for back pain.  Neurological: Negative for dizziness and headaches.  Psychiatric/Behavioral: Negative for depression. The patient has insomnia. The patient is not nervous/anxious.     Dilation: 7.5 Effacement (%): 100 Station: -1 Exam by:: Dr. Terri Piedra, MD Blood pressure 119/81, pulse 89, temperature 97.8 F (36.6 C), temperature source Axillary, resp. rate 18, height 5\' 4"  (1.626 m), weight 247 lb (112.038 kg), SpO2 100 %. Maternal Exam:  Uterine Assessment: Contraction strength is moderate.  Contraction frequency is regular.   Abdomen: Patient reports no abdominal tenderness. Estimated fetal weight is AGA.   Fetal presentation: vertex  Introitus: Normal vulva. Normal vagina.  Amniotic fluid character: clear.  Pelvis: adequate for delivery.   Cervix: Cervix evaluated by digital exam.     Physical Exam  Constitutional: She is oriented to person, place, and time. She appears well-developed and well-nourished.  Neck: Normal range of motion.  Cardiovascular: Normal rate.   Respiratory: Effort normal.  GI: Soft.  Genitourinary: Vagina normal and uterus normal.  Musculoskeletal: Normal range of motion. She exhibits no edema or tenderness.  Neurological: She is alert and oriented to person, place, and time.  Skin: Skin is warm.  Psychiatric: She has a normal mood and affect. Her behavior is normal. Judgment and thought content  normal.    Prenatal labs: ABO, Rh: --/--/A POS (04/03 2350) Antibody: NEG (04/03 2350) Rubella: Immune (09/06 0000) RPR: Nonreactive (09/06 0000)  HBsAg: Negative (09/06 0000)  HIV: Non-reactive (09/06 0000)  GBS: Negative (03/09 0000)   Assessment/Plan: [id number] at 35 4/[redacted]wks gestation with SROM and in active labor; GBS neg -Will augment labor as beginning to appear protracted-start pitocin -Forebag ruptured -Comfortable with epidural -Anticipate svd without comps  Cheryl Hampton 09/24/2015, 4:39 AM

## 2015-09-24 NOTE — Progress Notes (Signed)
Dr Raphael Gibney from anesthesia notified of pt's numbness to left thigh and inability to ambulate independently. MD stated numbness not likely to be epidural related but form delivery and to continue to monitor. Will notify of any progression of numbness or loss on continence.

## 2015-09-24 NOTE — Lactation Note (Signed)
This note was copied from a baby's chart. Lactation Consultation Note  Initial visit made.  Breastfeeding consultation services and support information given and reviewed.  Baby is 6 hours old and has latched twice.  Mom states she tried to breastfeed her first but baby would not latch.  Instructed on feeding cues and encouraged to put baby to breast with any cue.  Mom states her nipples are sore.  Reviewed importance of a deep latch and encouraged to call out for assist prn.  Patient Name: Cheryl Hampton [id number] Date: 09/24/2015 Reason for consult: Initial assessment   Maternal Data    Feeding Feeding Type: Breast Fed Length of feed: 20 min  LATCH Score/Interventions                      Lactation Tools Discussed/Used     Consult Status Consult Status: Follow-up Date: 09/25/15 Follow-up type: In-patient    Ave Filter 09/24/2015, 12:05 PM

## 2015-09-24 NOTE — Progress Notes (Signed)
Witnessed pt ambulate. Left thigh still numb, but pt about to walk without difficulty. Will continue to monitor.

## 2015-09-24 NOTE — Anesthesia Preprocedure Evaluation (Signed)
Anesthesia Evaluation  Patient identified by MRN, date of birth, ID band Patient awake    Reviewed: Allergy & Precautions, H&P , NPO status , Patient's Chart, lab work & pertinent test results  Airway Mallampati: II  TM Distance: >3 FB Neck ROM: full    Dental no notable dental hx.    Pulmonary neg pulmonary ROS,    Pulmonary exam normal breath sounds clear to auscultation       Cardiovascular negative cardio ROS Normal cardiovascular exam     Neuro/Psych    GI/Hepatic negative GI ROS, Neg liver ROS,   Endo/Other  Morbid obesity  Renal/GU negative Renal ROS     Musculoskeletal   Abdominal (+) + obese,   Peds  Hematology negative hematology ROS (+)   Anesthesia Other Findings   Reproductive/Obstetrics (+) Pregnancy                             Anesthesia Physical Anesthesia Plan  ASA: III  Anesthesia Plan: Epidural   Post-op Pain Management:    Induction:   Airway Management Planned:   Additional Equipment:   Intra-op Plan:   Post-operative Plan:   Informed Consent: I have reviewed the patients History and Physical, chart, labs and discussed the procedure including the risks, benefits and alternatives for the proposed anesthesia with the patient or authorized representative who has indicated his/her understanding and acceptance.     Plan Discussed with:   Anesthesia Plan Comments:         Anesthesia Quick Evaluation

## 2015-09-24 NOTE — Progress Notes (Signed)
Called by daytime nurse with concerns related numbness of the patient's left thigh.  Pt received epidural analgesia at 2 am and delivered vaginally (non operative) at 6:23.  Epidural was effective and removed with out incident.  Numbness resolved except for the left anterior thigh.  She denies motor weakness, back pain, loss of bowel or bladder control.  She requires assistance when walking to the bathroom reporting concern related to the numbness.   Upon arrival to the room the patient was sitting comfortably in the chair with her child.  My exam revealed a clean and dry epidural site without evidence of inflammation, and normal motor function bilaterally.  Numbness of the anterior thigh.   I feel this is more likely related to birth trauma then a complication of the epidural.  I discussed the characteristics of epidural vs birth injury with the patient and nurse.  We will monitor for progression and motor weakness, loss of bowel or bladder control, and the development of back pain.  Please notify the anesthesiologist on call if changes occur.

## 2015-09-24 NOTE — Anesthesia Postprocedure Evaluation (Signed)
Anesthesia Post Note  Patient: Cheryl Hampton  Procedure(s) Performed: * No procedures listed *  Patient location during evaluation: Mother Baby Anesthesia Type: Epidural Level of consciousness: awake and alert and oriented Pain management: satisfactory to patient Vital Signs Assessment: post-procedure vital signs reviewed and stable Respiratory status: spontaneous breathing and nonlabored ventilation Cardiovascular status: stable Postop Assessment: no headache, no backache, no signs of nausea or vomiting, adequate PO intake and patient able to bend at knees (patient up walking) Anesthetic complications: no (Seen by Dr. Tobias Alexander for residual numbness in left thigh. otherwise patient is stable.)    Last Vitals:  Filed Vitals:   09/24/15 1300 09/24/15 1446  BP: 144/83 133/74  Pulse: 85 82  Temp: 36.6 C   Resp: 18     Last Pain:  Filed Vitals:   09/24/15 1750  PainSc: 5                  Averill Winters

## 2015-09-24 NOTE — Anesthesia Procedure Notes (Signed)
Epidural Patient location during procedure: OB Start time: 09/24/2015 2:10 AM End time: 09/24/2015 2:14 AM  Staffing Anesthesiologist: Lyn Hollingshead  Preanesthetic Checklist Completed: patient identified, surgical consent, pre-op evaluation, timeout performed, IV checked, risks and benefits discussed and monitors and equipment checked  Epidural Patient position: sitting Prep: site prepped and draped and DuraPrep Patient monitoring: continuous pulse ox and blood pressure Approach: midline Location: L3-L4 Injection technique: LOR air  Needle:  Needle type: Tuohy  Needle gauge: 17 G Needle length: 9 cm and 9 Needle insertion depth: 7 cm Catheter type: closed end flexible Catheter size: 19 Gauge Catheter at skin depth: 10 cm Test dose: negative and Other  Assessment Sensory level: T9 Events: blood not aspirated, injection not painful, no injection resistance, negative IV test and no paresthesia  Additional Notes Reason for block:procedure for pain

## 2015-09-24 NOTE — Progress Notes (Signed)
Anesthesia notified regarding Right upper knee numbness.  Stated he will come evaluate patient.

## 2015-09-25 ENCOUNTER — Inpatient Hospital Stay (HOSPITAL_COMMUNITY): Admission: RE | Admit: 2015-09-25 | Payer: 59 | Source: Ambulatory Visit

## 2015-09-25 LAB — CBC
HCT: 28.5 % — ABNORMAL LOW (ref 36.0–46.0)
Hemoglobin: 9.2 g/dL — ABNORMAL LOW (ref 12.0–15.0)
MCH: 26.2 pg (ref 26.0–34.0)
MCHC: 32.3 g/dL (ref 30.0–36.0)
MCV: 81.2 fL (ref 78.0–100.0)
PLATELETS: 248 10*3/uL (ref 150–400)
RBC: 3.51 MIL/uL — AB (ref 3.87–5.11)
RDW: 16.2 % — ABNORMAL HIGH (ref 11.5–15.5)
WBC: 9.5 10*3/uL (ref 4.0–10.5)

## 2015-09-25 MED ORDER — BUTALBITAL-APAP-CAFFEINE 50-325-40 MG PO TABS: 1.0000 | ORAL_TABLET | Freq: Every day | ORAL | Status: DC

## 2015-09-25 MED ORDER — BUPROPION HCL 75 MG PO TABS
75.0000 mg | ORAL_TABLET | Freq: Two times a day (BID) | ORAL | Status: DC
  Administered 2015-09-25 – 2015-09-26 (×3): 75 mg via ORAL
  Filled 2015-09-25 (×3): qty 1

## 2015-09-25 MED ORDER — ESCITALOPRAM OXALATE 10 MG PO TABS
10.0000 mg | ORAL_TABLET | Freq: Every day | ORAL | Status: DC
  Administered 2015-09-25 – 2015-09-26 (×2): 10 mg via ORAL
  Filled 2015-09-25 (×2): qty 1

## 2015-09-25 NOTE — Lactation Note (Signed)
This note was copied from a baby's chart. Lactation Consultation Note; Mom reports nipples have been very sore since she used the NS. Reports they are too sore to latch baby. They look slightly pink and small area od redness in tips. Baby has had formula at last couple of feedings. Asleep in dad's arms at present. Mom has her own DEBP at bedside. Suggested pumping to promote milk supply.but mom does not want to do it at this time due to visitors. Encouraged to page for assist when baby wakes for next feeding. No questions at present,   Patient Name: Cheryl Hampton [id number] Date: 09/25/2015 Reason for consult: Follow-up assessment   Maternal Data Formula Feeding for Exclusion: No Has patient been taught Hand Expression?: Yes Does the patient have breastfeeding experience prior to this delivery?: No  Feeding Feeding Type: Bottle Fed - Formula  LATCH Score/Interventions                      Lactation Tools Discussed/Used     Consult Status Consult Status: Follow-up Date: 09/25/15 Follow-up type: In-patient    Truddie Crumble 09/25/2015, 2:48 PM

## 2015-09-25 NOTE — Progress Notes (Addendum)
Post Partum Day 1 Subjective: up ad lib, voiding, tolerating PO, + flatus and would like to restart her meds for depression. Had to bottlefeed today due to issues with latching.   Objective: Blood pressure 129/76, pulse 77, temperature 97.6 F (36.4 C), temperature source Oral, resp. rate 16, height 5\' 4"  (1.626 m), weight 247 lb (112.038 kg), SpO2 100 %, unknown if currently breastfeeding.  Physical Exam:  General: alert, cooperative and no distress Lochia: appropriate Uterine Fundus: firm Incision: n/a DVT Evaluation: No significant calf/ankle edema.   Recent Labs  09/23/15 2350 09/25/15 0555  HGB 10.7* 9.2*  HCT 33.1* 28.5*    Assessment/Plan: Plan for discharge tomorrow and Lactation consult  Discussed possible effects of meds - will restart on meds( wellbutrin and lexapro)   LOS: 2 days   [place] 09/25/2015, 10:51 AM

## 2015-09-25 NOTE — Lactation Note (Signed)
This note was copied from a baby's chart. Lactation Consultation Note New mom having difficulty latching baby. Mm has everted nipple that compresses inward, so it make it difficulty latching. Baby is frustrated d/t can't get a good latch, deep latch. Mom has colostrum. Hand expressed 2 ml. Gave in NS. Mom has generalized edema to entire body. Her legs, hands and breast has edema, which making latching shallow d/t nipple. Fitted mom w/#20 NS. States feels better. Encouraged mom to latch in football position d/t face pushed far into breast. Encouraged football hold so mom can see what she is doing. Hand pump given to evert nipple to pull nipple out. Discussed and demonstrated "C" hold for latching w/NS.  Patient Name: Cheryl Hampton [id number] Date: 09/25/2015 Reason for consult: Follow-up assessment;Difficult latch   Maternal Data Has patient been taught Hand Expression?: Yes Does the patient have breastfeeding experience prior to this delivery?: No  Feeding Feeding Type: Breast Fed Length of feed: 15 min (still BF)  LATCH Score/Interventions Latch: Repeated attempts needed to sustain latch, nipple held in mouth throughout feeding, stimulation needed to elicit sucking reflex. Intervention(s): Adjust position;Assist with latch;Breast massage;Breast compression  Audible Swallowing: A few with stimulation Intervention(s): Skin to skin;Hand expression;Alternate breast massage  Type of Nipple: Everted at rest and after stimulation  Comfort (Breast/Nipple): Soft / non-tender     Hold (Positioning): Assistance needed to correctly position infant at breast and maintain latch. Intervention(s): Skin to skin;Position options;Support Pillows;Breastfeeding basics reviewed  LATCH Score: 7  Lactation Tools Discussed/Used Tools: Nipple [place] Fuel;Pump Nipple shield size: 20 Breast pump type: Manual WIC Program: Yes Pump Review: Setup, frequency, and cleaning;Milk Storage Initiated by:: Allayne Stack  RN Date initiated:: 09/25/15   Consult Status Consult Status: Follow-up Date: 09/25/15 (in pm) Follow-up type: In-patient    Theodoro Kalata 09/25/2015, 4:19 AM

## 2015-09-25 NOTE — Progress Notes (Signed)
CLINICAL SOCIAL WORK MATERNAL/CHILD NOTE  Patient Details  Name: Cheryl Hampton MRN: [id number] Date of Birth: 09/24/2014  Date:  09/25/2015  Clinical Social Worker Initiating Note:  Elissa Hefty, MSW intern  Date/ Time Initiated:  09/25/15/0900     Child's Name:  Cheryl Hampton    Legal Guardian:  Cheryl Hampton and Cheryl Hampton    Need for Interpreter:  None   Date of Referral:  09/24/15     Reason for Referral:  Behavioral Health Issues, including SI    Referral Source:  [facility]   Address:  [place], [place] [address]  Phone number:  [phone]   Household Members:  Self, Minor Children, Significant Other   Natural Supports (not living in the home):  Children, Immediate Family, Extended Family, Spouse/significant other, Parent   Professional Supports: Psychiatrist (2012-present)  Dr. Cheryln Manly at [facility] [phone] 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 MOB shared she was prescribed Lexapro and Wellbutrin in 2012. Per MOB, she was on the medication for about three years and discontinued them December 2015. MOB disclosed she also developed hypothyroidism and was given medications for that as well but stopped taken them 6 months before she found out she was pregnant. MOB voiced she has a psychiatrist (Dr. Cheryln Manly) she has been seeing since 2012. According to [facility], her last session with him was on February 2016. MOB shared plans to see him in a few weeks regarding her risks for PPD again.     Cognitive State:  Insightful , Linear  Thinking , Alert , Goal Oriented    Mood/Affect:  Happy , Interested , Calm , Comfortable , Relaxed    CSW Assessment:  MSW intern presented in the patient's room due to a consult being placed by the central nursery because of a history of PPD. FOB was present in the room and MOB provided verbal consent for MSW intern to engage. MOB presented to be in a happy mood as evidence by her sitting down watching TV and engaging during the assessment. MOB shared she had a 41 year old daughter in 54 and a 83 year old step-daughter. Per MOB, the birthing process was "quicker and easier" this time compared to her last birthing process in 2012. MOB denied being in pain and shared she was transitioning well into postpartum. MOB expressed she is trying to breastfeed the infant but her milk has not come in yet. She stated she has been given the infant formula but is still trying to breastfeed and voiced lactation being helpful. MSW intern processed MOB's feelings about the feeding process. MOB expressed that it wasn't about her but about the infant and she was content with whatever feeding method the infant needed as long as the infant was healthy. MOB reported she bottle feed her 29 year old formula and she was fine and has grown to be a healthy child. MOB stated she will continue to try both breastfeeding and bottle feeding.  MOB disclosed FOB and her are from [place] but moved to [place] due  to her place of employment. 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 According to MOB, she will be getting 12 weeks of FMLA and is looking forward to spending time with her infant since she wasn't able to have that much time off with her 54 year old daughter. Per MOB, she feels well supported by FOB and has her mother to help her care for her children once she returns to work. MOB described having a great support system in the area and meeting all of the infant's basic needs.   MSW intern asked MOB how her mental  health was during the pregnancy. MOB shared the pregnancy was a "good" one but she did feel "stressed" and "sad" towards the end of the pregnancy. MOB reported feeling stressed about experiencing PPD again since she suffered form it after having her 29 year old in 2012. MOB described her PPD as feelings if detachment, sadness, loneliness, and crying. MOB denied suicidal ideation or intrusive thoughts. According to MOB the symptoms started right away and she finally sought help after two months postpartum. Per MOB, she was prescribed Lexapro and Wellbutrin. MOB also shared she attended therapy with Dr. Cheryln Manly. Along with suffering from PPD MOB developed hypothyroidism and stated she was prescribed medications for that as well. Per MOB, she was on her Lexapro and Wellbutrin for about three years and discontinued them around December 2015. MOB denied any symptoms of withdrawal or experiencing feelings of depression once she stopped taking her medications. MOB also reported her hormone levels normalized for her hypothyroidism and she was taken off her medications about 6 months before she became pregnant. According to [facility], she still attends therapy with Dr. Cheryln Manly once a year and has plans to schedule and appointment with him in the next few days.   MOB shared that she started to feel sad the last month of her pregnancy because she was "fearful" of going through PPD again. Per MOB, she has discussed her concerns with her OB and FOB. MOB shared she wants to start on her medications while at the hospital in order to be proactive and have some "control" of her mental health postpartum. MOB shared knowledge of the symptoms to look out for and her high risks of developing a perinatal mood disorder again. MSW intern provided further education on PMAD's and the hospital's support group, Feelings After Birth. MSW intern also provided MOB handouts with additional information. MOB expressed gratitude towards MSW intern for  the additional information provided. MSW intern asked MOB what coping techniques she had acquired throughout therapy. MOB voiced having a good and open communication with FOB about her feelings and fears has been helpful for her and has also prepared FOB on how to better support her and understand her in case she does experience PPD again. MOB also stated she has found it helpful to separate herself from stressors when she feels overwhelmed by going into a dark cold room alone and taking a few minutes for herself to calm down. MSW intern acknowledged MOB's coping techniques and also shared the importance of self-care and sleep. FOB shared they have discussed plans to still do things as a couple and as individuals in order to not feel "overwhlemed."   MSW intern thanked MOB for sharing her journey with her and praised her for her knowledge on PMAD's and awareness of herself and risks. MOB thanked MSW intern for the information provided and agreed to contact her if needs arise.   MSW  intern informed MOB's RN about her desire to start medications while at the hospital.   CSW Plan/Description:   Patient/Family Education- MSW intern provided education on PMAD's and the hospital's support group.  No Further Intervention Required/No Barriers to Discharge    Trevor Iha, Student-SW 09/25/2015, 9:52 AM

## 2015-09-26 MED ORDER — ESCITALOPRAM OXALATE 10 MG PO TABS: 10.0000 mg | ORAL_TABLET | Freq: Every day | ORAL | Status: DC

## 2015-09-26 MED ORDER — BUPROPION HCL 75 MG PO TABS: 75.0000 mg | ORAL_TABLET | Freq: Two times a day (BID) | ORAL | Status: DC

## 2015-09-26 MED ORDER — IBUPROFEN 600 MG PO TABS: 600.0000 mg | ORAL_TABLET | Freq: Four times a day (QID) | ORAL | Status: DC

## 2015-09-26 NOTE — Lactation Note (Signed)
This note was copied from a baby's chart. Lactation Consultation Note  Patient Name: Cheryl Hampton [id number] Date: 09/26/2015 Reason for consult: Follow-up assessment    With this mom of a term baby, now 67 hours old. Mom has been formula feeding, due to nipple stripes/pain. On exam of baby, he was able to pull my finger into his mouth, strong, rhythmic suckles. Mom has a DEP (personal) in her room, and her breasts were leaking milk. She agreed to pump and was able to express about 30 ml's or more in about 15 minutes. I reviewed EBm storage , and gave mom comfort gels to use, and instructed her in their use and care. I encouraged mom to make an o/p lactation appointment, but she wants to try latching once home first, and then will call if she decides this is what she wants to do.   Maternal Data    Feeding    LATCH Score/Interventions                      Lactation Tools Discussed/Used Pump Review: Setup, frequency, and cleaning;Milk Storage;Other (comment) (instructed on how to use pup and hand expression) Initiated by:: c Dayami Taitt RN IBCLC Date initiated:: 09/26/15   Consult Status Consult Status: Complete Follow-up type: Call as needed    Tonna Corner 09/26/2015, 9:23 AM

## 2015-09-26 NOTE — Progress Notes (Signed)
Patient ID: Cheryl Hampton, female   DOB: [birth year], 30 y.o.   MRN: [id number] Pt doing well. Pain well controlled. Bottlefeeding. Lochia mild. In good spirits and bonding well with baby. Ready for discharge home today VSS ABD- soft, NT EXT - no Homans A/P: PPD#2 s/p svd.         Discharge instructions reviewed        D/C home today

## 2015-09-26 NOTE — Discharge Summary (Signed)
OB Discharge Summary     Patient Name: Cheryl Hampton DOB: 1985-06-25 MRN: [id number]  Date of admission: 09/23/2015 Delivering MD: Carlynn Purl Navos   Date of discharge: 09/26/2015  Admitting diagnosis: 39w gushing fluid, ctx 3-6 min Intrauterine pregnancy: [redacted]w[redacted]d     Secondary diagnosis:  Active Problems:   Active labor at term   SVD (spontaneous vaginal delivery)   Postpartum care following vaginal delivery  Additional problems: none     Discharge diagnosis: Term Pregnancy Delivered                                                                                                Post partum procedures:none  Augmentation: AROM and Pitocin  Complications: None  Hospital course:  Onset of Labor With Vaginal Delivery     30 y.o. yo [id number] at [redacted]w[redacted]d was admitted in Active Labor on 09/23/2015. Patient had an uncomplicated labor course as follows:  Membrane Rupture Time/Date: 8:30 PM ,09/23/2015   Intrapartum Procedures: Episiotomy: None [1]                                         Lacerations:  Periurethral [8]  Patient had a delivery of a Viable infant. 09/24/2015  Information for the patient's newborn:  Engrid, Viggiano [id number]  Delivery Method: Vag-Spont    Pateint had an uncomplicated postpartum course.  She is ambulating, tolerating a regular diet, passing flatus, and urinating well. Patient is discharged home in stable condition on 09/26/2015.    Physical exam  Filed Vitals:   09/25/15 1857 09/25/15 1957 09/25/15 2045 09/26/15 0557  BP: 142/81 154/83 144/77 145/69  Pulse:    75  Temp:    98 F (36.7 C)  TempSrc:      Resp:    18  Height:      Weight:      SpO2:       General: alert, cooperative and no distress Lochia: appropriate Uterine Fundus: firm Incision: N/A DVT Evaluation: No evidence of DVT seen on physical exam. Labs: Lab Results  Component Value Date   WBC 9.5 09/25/2015   HGB 9.2* 09/25/2015   HCT 28.5* 09/25/2015   MCV 81.2 09/25/2015   PLT 248 09/25/2015   CMP Latest Ref Rng 09/23/2015  Glucose 70 - 99 mg/dL -  BUN 6 - 23 mg/dL -  Creatinine 0.40 - 1.20 mg/dL -  Sodium 135 - 145 mEq/L -  Potassium 3.5 - 5.1 mEq/L -  Chloride 96 - 112 mEq/L -  CO2 19 - 32 mEq/L -  Calcium 8.4 - 10.5 mg/dL -  Total Protein 6.5 - 8.1 g/dL 6.3(L)  Total Bilirubin 0.3 - 1.2 mg/dL 0.1(L)  Alkaline Phos 38 - 126 U/L 175(H)  AST 15 - 41 U/L 20  ALT 14 - 54 U/L 15    Discharge instruction: per After Visit Summary and "Baby and Me Booklet".  After visit meds:    Medication List    STOP taking these medications  acetaminophen 325 MG tablet  Commonly known as:  TYLENOL     promethazine 25 MG tablet  Commonly known as:  PHENERGAN      TAKE these medications        buPROPion 75 MG tablet  Commonly known as:  WELLBUTRIN  Take 1 tablet (75 mg total) by mouth 2 (two) times daily.     butalbital-acetaminophen-caffeine 50-325-40 MG tablet  Commonly known as:  FIORICET, ESGIC  Take 1 tablet by mouth every 4 (four) hours as needed for headache.     escitalopram 10 MG tablet  Commonly known as:  LEXAPRO  Take 1 tablet (10 mg total) by mouth daily.     ibuprofen 600 MG tablet  Commonly known as:  ADVIL,MOTRIN  Take 1 tablet (600 mg total) by mouth every 6 (six) hours.     phenylephrine-shark liver oil-mineral oil-petrolatum 0.25-3-14-71.9 % rectal ointment  Commonly known as:  PREPARATION H  Place 1 application rectally 2 (two) times daily as needed for hemorrhoids.     pramoxine 1 % foam  Commonly known as:  PROCTOFOAM  Place 1 application rectally 3 (three) times daily as needed for itching.     prenatal multivitamin Tabs tablet  Take 1 tablet by mouth daily at 12 noon.        Diet: routine diet  Activity: Advance as tolerated. Pelvic rest for 5 weeks.   Outpatient follow up:6 weeks Follow up Appt:No future appointments. Follow up Visit:No Follow-up on file.  Postpartum contraception: Nexplanon  Newborn  Data: Live born female  Birth Weight: 7 lb 12 oz (3515 g) APGAR: 9, 9  Baby Feeding: Bottle Disposition:home with mother   09/26/2015 [place], DO

## 2015-09-26 NOTE — Discharge Instructions (Signed)
Nothing in vagina for 6 weeks.  No sex, tampons, and douching.  Other instructions as in [facility] Discharge Booklet. °

## 2015-11-05 DIAGNOSIS — Z3009 Encounter for other general counseling and advice on contraception: Secondary | ICD-10-CM | POA: Diagnosis not present

## 2015-11-05 DIAGNOSIS — Z1389 Encounter for screening for other disorder: Secondary | ICD-10-CM | POA: Diagnosis not present

## 2015-11-13 DIAGNOSIS — Z3046 Encounter for surveillance of implantable subdermal contraceptive: Secondary | ICD-10-CM | POA: Diagnosis not present

## 2015-11-13 DIAGNOSIS — Z3202 Encounter for pregnancy test, result negative: Secondary | ICD-10-CM | POA: Diagnosis not present

## 2015-12-11 ENCOUNTER — Telehealth: Payer: Self-pay

## 2015-12-11 ENCOUNTER — Encounter: Payer: Self-pay | Admitting: Internal Medicine

## 2015-12-11 DIAGNOSIS — L659 Nonscarring hair loss, unspecified: Secondary | ICD-10-CM

## 2015-12-11 NOTE — Telephone Encounter (Signed)
Lab order placed for thyroid. CPE in October with Dr. Quay Burow

## 2015-12-12 ENCOUNTER — Other Ambulatory Visit (INDEPENDENT_AMBULATORY_CARE_PROVIDER_SITE_OTHER): Payer: 59

## 2015-12-12 ENCOUNTER — Telehealth: Payer: Self-pay | Admitting: Internal Medicine

## 2015-12-12 DIAGNOSIS — L659 Nonscarring hair loss, unspecified: Secondary | ICD-10-CM

## 2015-12-12 DIAGNOSIS — R7989 Other specified abnormal findings of blood chemistry: Secondary | ICD-10-CM

## 2015-12-12 LAB — TSH: TSH: 0.06 u[IU]/mL — ABNORMAL LOW (ref 0.35–4.50)

## 2015-12-12 NOTE — Telephone Encounter (Signed)
Pt is not currently taking any thyroid medication. She has not taken any for 6 mo. Dr Asa Lente took her off while she was pregnant. She was taking 75 mcg

## 2015-12-12 NOTE — Telephone Encounter (Signed)
Spoke with pt. She will come in next week for blood work.

## 2015-12-12 NOTE — Telephone Encounter (Signed)
She is now having an overactive thyroid (previously it was underactive).  I would like her to have repeat blood work to check some additional thyroid tests. -- depending on the results she may need to see a thyroid specialist.  (ordered)

## 2015-12-12 NOTE — Telephone Encounter (Signed)
Thyroid is over active.  Is she currently on thyroid medication - we do not have any in our med list??

## 2015-12-17 ENCOUNTER — Other Ambulatory Visit (INDEPENDENT_AMBULATORY_CARE_PROVIDER_SITE_OTHER): Payer: 59

## 2015-12-17 ENCOUNTER — Encounter: Payer: Self-pay | Admitting: Internal Medicine

## 2015-12-17 ENCOUNTER — Other Ambulatory Visit: Payer: Self-pay | Admitting: Internal Medicine

## 2015-12-17 DIAGNOSIS — E0590 Thyrotoxicosis, unspecified without thyrotoxic crisis or storm: Secondary | ICD-10-CM

## 2015-12-17 DIAGNOSIS — R946 Abnormal results of thyroid function studies: Secondary | ICD-10-CM | POA: Diagnosis not present

## 2015-12-17 DIAGNOSIS — R7989 Other specified abnormal findings of blood chemistry: Secondary | ICD-10-CM

## 2015-12-17 LAB — TSH: TSH: 0.09 u[IU]/mL — ABNORMAL LOW (ref 0.35–4.50)

## 2015-12-17 LAB — T4, FREE: FREE T4: 1.48 ng/dL (ref 0.60–1.60)

## 2015-12-18 ENCOUNTER — Encounter: Payer: Self-pay | Admitting: Internal Medicine

## 2015-12-18 LAB — THYROID ANTIBODIES
Thyroglobulin Ab: 2 IU/mL — ABNORMAL HIGH (ref <2)
Thyroperoxidase Ab SerPl-aCnc: >900 IU/mL — ABNORMAL HIGH (ref <9)

## 2015-12-19 ENCOUNTER — Encounter: Payer: Self-pay | Admitting: Internal Medicine

## 2016-01-14 ENCOUNTER — Ambulatory Visit (INDEPENDENT_AMBULATORY_CARE_PROVIDER_SITE_OTHER): Payer: 59 | Admitting: Endocrinology

## 2016-01-14 ENCOUNTER — Encounter: Payer: Self-pay | Admitting: Endocrinology

## 2016-01-14 DIAGNOSIS — E0590 Thyrotoxicosis, unspecified without thyrotoxic crisis or storm: Secondary | ICD-10-CM | POA: Diagnosis not present

## 2016-01-14 LAB — TSH: TSH: 10.77 u[IU]/mL — AB (ref 0.35–4.50)

## 2016-01-14 LAB — T4, FREE: FREE T4: 0.54 ng/dL — AB (ref 0.60–1.60)

## 2016-01-14 MED ORDER — LEVOTHYROXINE SODIUM 75 MCG PO TABS: 75.0000 ug | ORAL_TABLET | Freq: Every day | ORAL | 3 refills | Status: DC

## 2016-01-14 MED ORDER — METOPROLOL SUCCINATE ER 25 MG PO TB24: 12.5000 mg | ORAL_TABLET | Freq: Every day | ORAL | 3 refills | Status: DC

## 2016-01-14 NOTE — Patient Instructions (Signed)
blood tests are requested for you today.  We'll let you know about the results. Based on the results, I may need to prescribe for you a pill called "methimazole." I have sent a prescription to your pharmacy, to slow the heart rate.  Please come back for a follow-up appointment in 1 month.

## 2016-01-14 NOTE — Progress Notes (Signed)
Subjective:    Patient ID: Cheryl Hampton, female    DOB: 05-03-1986, 30 y.o.   MRN: [id number]  HPI Pt was rx'ed for hypothyroidism 2012-2016.  Synthroid was d/c'ed in late 2016.  She now reports 6 weeks of moderate palpitations in the chest, and assoc hair loss.  She is 3 months postpartum.  she has never had XRT to the anterior neck, or thyroid surgery.  she has never had dedicated thyroid imaging.  she does not consume kelp or any other non-prescribed thyroid medication.  She is not breast-feeding.  Past Medical History:  Diagnosis Date  . Depression    Post partum took lexapro and wellbutrin and it helped  . Hypothyroid 2012 dx   post partum hyperthyroid  . Migraine headache     Past Surgical History:  Procedure Laterality Date  . MOUTH SURGERY    . Removal (R) Fallopian tube  09/2009    Social History   Social History  . Marital status: Legally Separated    Spouse name: N/A  . Number of children: N/A  . Years of education: N/A   Occupational History  . Not on file.   Social History Main Topics  . Smoking status: Never Smoker  . Smokeless tobacco: Never Used  . Alcohol use No  . Drug use: No  . Sexual activity: Yes   Other Topics Concern  . Not on file   Social History Narrative  . No narrative on file    Current Outpatient Prescriptions on File Prior to Visit  Medication Sig Dispense Refill  . buPROPion (WELLBUTRIN) 75 MG tablet Take 1 tablet (75 mg total) by mouth 2 (two) times daily. (Patient not taking: Reported on 01/14/2016) 60 tablet 1  . butalbital-acetaminophen-caffeine (FIORICET, ESGIC) 50-325-40 MG per tablet Take 1 tablet by mouth every 4 (four) hours as needed for headache. (Patient not taking: Reported on 01/14/2016) 90 tablet 1  . escitalopram (LEXAPRO) 10 MG tablet Take 1 tablet (10 mg total) by mouth daily. (Patient not taking: Reported on 01/14/2016) 60 tablet 1  . ibuprofen (ADVIL,MOTRIN) 600 MG tablet Take 1 tablet (600 mg total) by mouth  every 6 (six) hours. (Patient not taking: Reported on 01/14/2016) 60 tablet 1  . phenylephrine-shark liver oil-mineral oil-petrolatum (PREPARATION H) 0.25-3-14-71.9 % rectal ointment Place 1 application rectally 2 (two) times daily as needed for hemorrhoids.    . pramoxine (PROCTOFOAM) 1 % foam Place 1 application rectally 3 (three) times daily as needed for itching. (Patient not taking: Reported on 09/23/2015) 15 g 0  . Prenatal Vit-Fe Fumarate-FA (PRENATAL MULTIVITAMIN) TABS tablet Take 1 tablet by mouth daily at 12 noon.     No current facility-administered medications on file prior to visit.     No Known Allergies  Family History  Problem Relation Age of Onset  . Alcohol abuse Maternal Uncle   . Pancreatic cancer Maternal Grandmother   . Stroke Maternal Grandfather   . Hypertension Mother   . Diabetes Mother   . Hyperlipidemia Mother   . Thyroid disease Neg Hx     BP 127/86   Pulse 89   Temp 98.4 F (36.9 C) (Oral)   Ht 5\' 4"  (1.626 m)   Wt 225 lb 12.8 oz (102.4 kg)   LMP 01/14/2016   SpO2 97%   BMI 38.76 kg/m   Review of Systems denies hoarseness, diplopia, edema, sob, diarrhea, polyuria, muscle weakness, heat intolerance, and rhinorrhea.  She gained 50 lbs with the pregnancy, and has  since re-lost 40 of that.  She has headache, tremor, anxiety, easy bruising, and excessive sweating.       Objective:   Physical Exam VS: see vs page GEN: no distress HEAD: head: no deformity eyes: no periorbital swelling, no proptosis external nose and ears are normal mouth: no lesion seen NECK: thyroid is minimally enlarged, if at all.  No palpable nodule. CHEST WALL: no deformity LUNGS: clear to auscultation CV: reg rate and rhythm, no murmur.   ABD: abdomen is soft, nontender.  no hepatosplenomegaly.  not distended.  no hernia.   MUSCULOSKELETAL: muscle bulk and strength are grossly normal.  no obvious joint swelling.  gait is normal and steady EXTEMITIES: no deformity.  no ulcer  on the feet.  feet are of normal color and temp.  no edema.  PULSES: dorsalis pedis intact bilat.  no carotid bruit NEURO:  cn 2-12 grossly intact.   readily moves all 4's.  sensation is intact to touch on the feet SKIN:  Normal texture and temperature.  No rash or suspicious lesion is visible.   NODES:  None palpable at the neck PSYCH: alert, well-oriented.  Does not appear anxious nor depressed.   Lab Results  Component Value Date   TSH 0.09 (L) 12/17/2015   T3TOTAL 96.6 05/04/2013   Lab Results  Component Value Date   TSH 10.77 (H) 01/14/2016   T3TOTAL 96.6 05/04/2013   CT C-spine: no mention is made of the thyroid.  I have reviewed outside records, and summarized: Pt called PCP to report sxs of hair loss.     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   resume the levothyroxine, 75 mcg/day. Postpartum state, new to me: in this setting, sxs are difficult to interpret, and fluctuations in thyroid function are common, so we'll follow.     Patient is advised the following: Patient Instructions  blood tests are requested for you today.  We'll let you know about the results. Based on the results, I may need to prescribe for you a pill called "methimazole." I have sent a prescription to your pharmacy, to slow the heart rate.  Please come back for a follow-up appointment in 1 month.    Renato Shin, MD

## 2016-01-30 ENCOUNTER — Ambulatory Visit: Payer: Self-pay | Admitting: Internal Medicine

## 2016-02-14 ENCOUNTER — Ambulatory Visit (INDEPENDENT_AMBULATORY_CARE_PROVIDER_SITE_OTHER): Payer: 59 | Admitting: Endocrinology

## 2016-02-14 ENCOUNTER — Encounter: Payer: Self-pay | Admitting: Endocrinology

## 2016-02-14 VITALS — BP 126/84 | HR 72 | Ht 64.0 in | Wt 225.0 lb

## 2016-02-14 DIAGNOSIS — E0590 Thyrotoxicosis, unspecified without thyrotoxic crisis or storm: Secondary | ICD-10-CM

## 2016-02-14 NOTE — Progress Notes (Signed)
   Subjective:    Patient ID: Cheryl Hampton, female    DOB: 01/17/1986, 30 y.o.   MRN: [id number]  HPI Pt returns for f/u of autoimmune thyroid dz (she was rx'ed for hypothyroidism 2012-2016; synthroid was d/c'ed in late 2016; she has never had dedicated thyroid imaging; she developed hyperthyroidism in 2017; followed by recurrent hypothyroidism).  She still has slight hair loss and weight gain.  She takes synthroid as rx'ed.  She is not breast-feeding Past Medical History:  Diagnosis Date  . Depression    Post partum took lexapro and wellbutrin and it helped  . Hypothyroid 2012 dx   post partum hyperthyroid  . Migraine headache     Past Surgical History:  Procedure Laterality Date  . MOUTH SURGERY    . Removal (R) Fallopian tube  09/2009    Social History   Social History  . Marital status: Legally Separated    Spouse name: N/A  . Number of children: N/A  . Years of education: N/A   Occupational History  . Not on file.   Social History Main Topics  . Smoking status: Never Smoker  . Smokeless tobacco: Never Used  . Alcohol use No  . Drug use: No  . Sexual activity: Yes   Other Topics Concern  . Not on file   Social History Narrative  . No narrative on file    Current Outpatient Prescriptions on File Prior to Visit  Medication Sig Dispense Refill  . butalbital-acetaminophen-caffeine (FIORICET, ESGIC) 50-325-40 MG per tablet Take 1 tablet by mouth every 4 (four) hours as needed for headache. 90 tablet 1  . levothyroxine (SYNTHROID, LEVOTHROID) 75 MCG tablet Take 1 tablet (75 mcg total) by mouth daily before breakfast. 90 tablet 3  . ibuprofen (ADVIL,MOTRIN) 600 MG tablet Take 1 tablet (600 mg total) by mouth every 6 (six) hours. (Patient not taking: Reported on 01/14/2016) 60 tablet 1  . phenylephrine-shark liver oil-mineral oil-petrolatum (PREPARATION H) 0.25-3-14-71.9 % rectal ointment Place 1 application rectally 2 (two) times daily as needed for hemorrhoids.    .  pramoxine (PROCTOFOAM) 1 % foam Place 1 application rectally 3 (three) times daily as needed for itching. (Patient not taking: Reported on 09/23/2015) 15 g 0  . Prenatal Vit-Fe Fumarate-FA (PRENATAL MULTIVITAMIN) TABS tablet Take 1 tablet by mouth daily at 12 noon.     No current facility-administered medications on file prior to visit.     No Known Allergies  Family History  Problem Relation Age of Onset  . Alcohol abuse Maternal Uncle   . Pancreatic cancer Maternal Grandmother   . Stroke Maternal Grandfather   . Hypertension Mother   . Diabetes Mother   . Hyperlipidemia Mother   . Thyroid disease Neg Hx     BP 126/84   Pulse 72   Ht 5\' 4"  (1.626 m)   Wt 225 lb (102.1 kg)   BMI 38.62 kg/m   Review of Systems She has dry skin.     Objective:   Physical Exam VITAL SIGNS:  See vs page GENERAL: no distress NECK: There is no palpable thyroid enlargement.  No thyroid nodule is palpable.  No palpable lymphadenopathy at the anterior neck.         Assessment & Plan:  Autoimmune thyroid dz, with alternating hyper- and hypothyroidism.  Due for recheck

## 2016-02-14 NOTE — Patient Instructions (Addendum)
blood tests are requested for you today.  We'll let you know about the results.   Please come back for a follow-up appointment in 3 months.  Please call if you want to do blood tests in between appointments

## 2016-02-17 ENCOUNTER — Other Ambulatory Visit (INDEPENDENT_AMBULATORY_CARE_PROVIDER_SITE_OTHER): Payer: 59

## 2016-02-17 ENCOUNTER — Other Ambulatory Visit: Payer: Self-pay | Admitting: Endocrinology

## 2016-02-17 DIAGNOSIS — E0590 Thyrotoxicosis, unspecified without thyrotoxic crisis or storm: Secondary | ICD-10-CM

## 2016-02-17 LAB — T4, FREE: Free T4: 0.77 ng/dL (ref 0.60–1.60)

## 2016-02-17 LAB — TSH: TSH: 7.38 u[IU]/mL — AB (ref 0.35–4.50)

## 2016-02-17 MED ORDER — LEVOTHYROXINE SODIUM 100 MCG PO TABS: 100.0000 ug | ORAL_TABLET | Freq: Every day | ORAL | 3 refills | Status: DC

## 2016-03-30 ENCOUNTER — Ambulatory Visit: Payer: Self-pay | Admitting: Internal Medicine

## 2016-04-01 ENCOUNTER — Encounter: Payer: Self-pay | Admitting: Endocrinology

## 2016-04-02 ENCOUNTER — Other Ambulatory Visit: Payer: Self-pay | Admitting: Endocrinology

## 2016-04-02 DIAGNOSIS — E0590 Thyrotoxicosis, unspecified without thyrotoxic crisis or storm: Secondary | ICD-10-CM

## 2016-04-03 ENCOUNTER — Other Ambulatory Visit (INDEPENDENT_AMBULATORY_CARE_PROVIDER_SITE_OTHER): Payer: 59

## 2016-04-03 DIAGNOSIS — N926 Irregular menstruation, unspecified: Secondary | ICD-10-CM | POA: Diagnosis not present

## 2016-04-03 DIAGNOSIS — Z124 Encounter for screening for malignant neoplasm of cervix: Secondary | ICD-10-CM | POA: Diagnosis not present

## 2016-04-03 DIAGNOSIS — Z1389 Encounter for screening for other disorder: Secondary | ICD-10-CM | POA: Diagnosis not present

## 2016-04-03 DIAGNOSIS — Z3046 Encounter for surveillance of implantable subdermal contraceptive: Secondary | ICD-10-CM | POA: Diagnosis not present

## 2016-04-03 DIAGNOSIS — Z130 Encounter for screening for diseases of the blood and blood-forming organs and certain disorders involving the immune mechanism: Secondary | ICD-10-CM | POA: Diagnosis not present

## 2016-04-03 DIAGNOSIS — Z6838 Body mass index (BMI) 38.0-38.9, adult: Secondary | ICD-10-CM | POA: Diagnosis not present

## 2016-04-03 DIAGNOSIS — E0590 Thyrotoxicosis, unspecified without thyrotoxic crisis or storm: Secondary | ICD-10-CM | POA: Diagnosis not present

## 2016-04-03 DIAGNOSIS — Z01419 Encounter for gynecological examination (general) (routine) without abnormal findings: Secondary | ICD-10-CM | POA: Diagnosis not present

## 2016-04-03 DIAGNOSIS — Z1151 Encounter for screening for human papillomavirus (HPV): Secondary | ICD-10-CM | POA: Diagnosis not present

## 2016-04-03 LAB — TSH: TSH: 3.28 u[IU]/mL (ref 0.35–4.50)

## 2016-04-03 LAB — T4, FREE: Free T4: 0.9 ng/dL (ref 0.60–1.60)

## 2016-04-05 ENCOUNTER — Encounter: Payer: Self-pay | Admitting: Endocrinology

## 2016-05-17 NOTE — Progress Notes (Deleted)
   Subjective:    Patient ID: Cheryl Hampton, female    DOB: 1985-08-30, 30 y.o.   MRN: [id number]  HPI Pt returns for f/u of autoimmune thyroid dz (she was rx'ed for hypothyroidism 2012-2016; synthroid was d/c'ed in late 2016; she has never had dedicated thyroid imaging; she developed hyperthyroidism in 2017; followed by recurrent hypothyroidism; she takes synthroid).  She still has slight hair loss and weight gain.  She takes synthroid as rx'ed.  She is not breast-feeding   Review of Systems     Objective:   Physical Exam VITAL SIGNS:  See vs page GENERAL: no distress NECK: There is no palpable thyroid enlargement.  No thyroid nodule is palpable.  No palpable lymphadenopathy at the anterior neck.        Assessment & Plan:  Autoimmune thyroid dz, with alternating hyper- and hypothyroidism.  Due for recheck

## 2016-05-18 ENCOUNTER — Ambulatory Visit: Payer: Self-pay | Admitting: Endocrinology

## 2016-06-12 ENCOUNTER — Other Ambulatory Visit (INDEPENDENT_AMBULATORY_CARE_PROVIDER_SITE_OTHER): Payer: 59

## 2016-06-12 ENCOUNTER — Ambulatory Visit (INDEPENDENT_AMBULATORY_CARE_PROVIDER_SITE_OTHER): Payer: 59 | Admitting: Internal Medicine

## 2016-06-12 ENCOUNTER — Encounter: Payer: Self-pay | Admitting: Internal Medicine

## 2016-06-12 VITALS — BP 124/88 | HR 88 | Temp 98.3°F | Resp 16 | Ht 64.0 in | Wt 228.0 lb

## 2016-06-12 DIAGNOSIS — G8929 Other chronic pain: Secondary | ICD-10-CM

## 2016-06-12 DIAGNOSIS — M545 Low back pain, unspecified: Secondary | ICD-10-CM

## 2016-06-12 DIAGNOSIS — F32A Depression, unspecified: Secondary | ICD-10-CM

## 2016-06-12 DIAGNOSIS — G43809 Other migraine, not intractable, without status migrainosus: Secondary | ICD-10-CM | POA: Diagnosis not present

## 2016-06-12 DIAGNOSIS — L659 Nonscarring hair loss, unspecified: Secondary | ICD-10-CM

## 2016-06-12 DIAGNOSIS — E0590 Thyrotoxicosis, unspecified without thyrotoxic crisis or storm: Secondary | ICD-10-CM

## 2016-06-12 DIAGNOSIS — E039 Hypothyroidism, unspecified: Secondary | ICD-10-CM

## 2016-06-12 DIAGNOSIS — F329 Major depressive disorder, single episode, unspecified: Secondary | ICD-10-CM

## 2016-06-12 LAB — TSH: TSH: 1.55 u[IU]/mL (ref 0.35–4.50)

## 2016-06-12 LAB — T4, FREE: FREE T4: 0.97 ng/dL (ref 0.60–1.60)

## 2016-06-12 NOTE — Progress Notes (Signed)
Subjective:    Patient ID: Cheryl Hampton, female    DOB: Jun 01, 1986, 30 y.o.   MRN: [id number]  HPI She is here to establish with a new pcp.    She has had two children.  She had two epidurals.  She had back after the first epidural and it is worse after the second epidural.  She has pain almost daily at the area where she had the epidural.  She has pain after bending, being on the floor.  She wakes every morning with pain in her back.  She has seen a chiropractor in the past.  She denies radiating pain into her leg, numbness/tingling in her legs.  She denies weakness in her legs.    She is active with work and with her children.  She is not exercising regularly.    Hair loss:  She has had hair loss for the past 9 months ( since giving birth to her second).  Her thyroid function has been in the normal range.  The hair loss is diffuse.    Hypothyroidism:  She sees Dr Loanne Drilling. She is taking her medication daily.  She denies any recent changes in energy or weight that are unexplained.   Obesity:  She is doing weight watchers.  She is eating healthy.  She is taking prenatal vitamins.   Migraines:  She has had an increase in headaches recently.  She has about once a month.  She thinks it is stress related.    Anxiety, depression: She has some mild depression/anxiety.  She has been on lexapro in the past and wonders about going back on.   Medications and allergies reviewed with patient and updated if appropriate.  Patient Active Problem List   Diagnosis Date Noted  . Hyperthyroidism 01/14/2016  . SVD (spontaneous vaginal delivery) 09/24/2015  . Postpartum care following vaginal delivery 09/24/2015  . Active labor at term 09/23/2015  . Obesity, unspecified 01/09/2013  . Depression   . Migraine headache 02/06/2011    Current Outpatient Prescriptions on File Prior to Visit  Medication Sig Dispense Refill  . butalbital-acetaminophen-caffeine (FIORICET, ESGIC) 50-325-40 MG per tablet  Take 1 tablet by mouth every 4 (four) hours as needed for headache. 90 tablet 1  . ibuprofen (ADVIL,MOTRIN) 600 MG tablet Take 1 tablet (600 mg total) by mouth every 6 (six) hours. 60 tablet 1  . levothyroxine (SYNTHROID, LEVOTHROID) 100 MCG tablet Take 1 tablet (100 mcg total) by mouth daily. 90 tablet 3  . Prenatal Vit-Fe Fumarate-FA (PRENATAL MULTIVITAMIN) TABS tablet Take 1 tablet by mouth daily at 12 noon.     No current facility-administered medications on file prior to visit.     Past Medical History:  Diagnosis Date  . Depression    Post partum took lexapro and wellbutrin and it helped  . Hypothyroid 2012 dx   post partum hyperthyroid  . Migraine headache     Past Surgical History:  Procedure Laterality Date  . MOUTH SURGERY    . Removal (R) Fallopian tube  09/2009    Social History   Social History  . Marital status: Legally Separated    Spouse name: N/A  . Number of children: N/A  . Years of education: N/A   Social History Main Topics  . Smoking status: Never Smoker  . Smokeless tobacco: Never Used  . Alcohol use No  . Drug use: No  . Sexual activity: Yes   Other Topics Concern  . Not on file  Social History Narrative  . No narrative on file    Family History  Problem Relation Age of Onset  . Alcohol abuse Maternal Uncle   . Pancreatic cancer Maternal Grandmother   . Stroke Maternal Grandfather   . Hypertension Mother   . Diabetes Mother   . Hyperlipidemia Mother   . Thyroid disease Neg Hx     Review of Systems  Constitutional: Negative for chills and fever.  Eyes: Negative for visual disturbance.  Respiratory: Negative for cough, shortness of breath and wheezing.   Cardiovascular: Negative for chest pain and leg swelling.  Gastrointestinal: Negative for abdominal pain, blood in stool, diarrhea and nausea.       No gerd  Neurological: Positive for headaches. Negative for dizziness and light-headedness.  Psychiatric/Behavioral: Positive for  dysphoric mood. The patient is nervous/anxious.        Objective:   Vitals:   06/12/16 1457  BP: 124/88  Pulse: 88  Resp: 16  Temp: 98.3 F (36.8 C)   Filed Weights   06/12/16 1457  Weight: 228 lb (103.4 kg)   Body mass index is 39.14 kg/m.   Physical Exam Constitutional: Appears well-developed and well-nourished. No distress.  HENT:  Head: Normocephalic and atraumatic.  Neck: Neck supple. No tracheal deviation present. No thyromegaly present.  No cervical lymphadenopathy Cardiovascular: Normal rate, regular rhythm and normal heart sounds.   No murmur heard. No carotid bruit .  No edema Pulmonary/Chest: Effort normal and breath sounds normal. No respiratory distress. No has no wheezes. No rales.  Skin: Skin is warm and dry. Not diaphoretic. mild thinning in hair - not focal Psychiatric: Normal mood and affect. Behavior is normal.        Assessment & Plan:    Fu annually   See Problem List for Assessment and Plan of chronic medical problems.

## 2016-06-12 NOTE — Assessment & Plan Note (Signed)
Dr Loanne Drilling Check tfts  Managed by Dr Loanne Drilling

## 2016-06-12 NOTE — Assessment & Plan Note (Signed)
Working on [employer] loss  Weight watchers Diet changes

## 2016-06-12 NOTE — Patient Instructions (Addendum)
  Test(s) ordered today. Your results will be released to [place] (or called to you) after review, usually within 72hours after test completion. If any changes need to be made, you will be notified at that same time.  All other Health Maintenance issues reviewed.   All recommended immunizations and age-appropriate screenings are up-to-date or discussed.  No immunizations administered today.   Medications reviewed and updated.  No changes recommended at this time.   A referral was ordered for Dr Tamala Julian.   Please followup in annually

## 2016-06-12 NOTE — Assessment & Plan Note (Signed)
Chronic, lower back pain Refer to Dr Tamala Julian

## 2016-06-12 NOTE — Progress Notes (Signed)
Pre visit review using our clinic review tool, if applicable. No additional management support is needed unless otherwise documented below in the visit note. 

## 2016-06-12 NOTE — Assessment & Plan Note (Signed)
Gets migraines once a week Takes ibuprofen - if it does not work takes Scientist, research (physical sciences)

## 2016-06-12 NOTE — Assessment & Plan Note (Signed)
Likely autoimmune in nature Will likely correct with time

## 2016-06-12 NOTE — Assessment & Plan Note (Signed)
Doing ok for now off medication Will consider going back on lexapro

## 2016-06-13 ENCOUNTER — Encounter: Payer: Self-pay | Admitting: Internal Medicine

## 2016-06-26 ENCOUNTER — Ambulatory Visit: Payer: Self-pay | Admitting: Endocrinology

## 2016-06-30 ENCOUNTER — Encounter: Payer: Self-pay | Admitting: Internal Medicine

## 2016-07-03 ENCOUNTER — Encounter: Payer: Self-pay | Admitting: Internal Medicine

## 2016-07-03 NOTE — Telephone Encounter (Signed)
Please advise, message was sent on 06/30/16

## 2016-07-06 NOTE — Telephone Encounter (Signed)
Please advise 

## 2016-07-10 ENCOUNTER — Ambulatory Visit: Payer: Self-pay | Admitting: Family Medicine

## 2016-07-10 NOTE — Progress Notes (Deleted)
[facility] [address] [place], [place] [address] Phone: [phone] Subjective:    I'm seeing this patient by the request  of:  Binnie Rail, MD   CC: Back pain  [id number]  Cheryl Hampton is a 31 y.o. female coming in with complaint of low back pain. Patient states that she has had this problem for quite some time. Worse after she feels that she had epidurals while she was in labor. Patient states that this pain bending her being on the floor for long amount of time. Patient states every morning the pain seems to be worse. Has been seen a chiropractor fairly regularly with very mild improvement. Patient states that the pain seems to stay localized. Denies any significant radiation the legs or any numbness Patient has had some difficulty with back pain for many years. Last imaging showed sacral view taken 01/02/2014. This was independently visualized by me. No significant bony normality's at that time.    Past Medical History:  Diagnosis Date  . Depression    Post partum took lexapro and wellbutrin and it helped  . Hypothyroid 2012 dx   post partum hyperthyroid  . Migraine headache    Past Surgical History:  Procedure Laterality Date  . MOUTH SURGERY    . Removal (R) Fallopian tube  09/2009   Social History   Social History  . Marital status: Legally Separated    Spouse name: N/A  . Number of children: N/A  . Years of education: N/A   Social History Main Topics  . Smoking status: Never Smoker  . Smokeless tobacco: Never Used  . Alcohol use No  . Drug use: No  . Sexual activity: Yes   Other Topics Concern  . Not on file   Social History Narrative  . No narrative on file   No Known Allergies Family History  Problem Relation Age of Onset  . Alcohol abuse Maternal Uncle   . Pancreatic cancer Maternal Grandmother   . Stroke Maternal Grandfather   . Hypertension Mother   . Diabetes Mother   . Hyperlipidemia Mother   . Thyroid  disease Neg Hx     Past medical history, social, surgical and family history all reviewed in electronic medical record.  No pertanent information unless stated regarding to the chief complaint.   Review of Systems:Review of systems updated and as accurate as of 07/10/16  No headache, visual changes, nausea, vomiting, diarrhea, constipation, dizziness, abdominal pain, skin rash, fevers, chills, night sweats, weight loss, swollen lymph nodes, body aches, joint swelling, muscle aches, chest pain, shortness of breath, mood changes.   Objective  unknown if currently breastfeeding. Systems examined below as of 07/10/16   General: No apparent distress alert and oriented x3 mood and affect normal, dressed appropriately.  HEENT: Pupils equal, extraocular movements intact  Respiratory: Patient's speak in full sentences and does not appear short of breath  Cardiovascular: No lower extremity edema, non tender, no erythema  Skin: Warm dry intact with no signs of infection or rash on extremities or on axial skeleton.  Abdomen: Soft nontender  Neuro: Cranial nerves II through XII are intact, neurovascularly intact in all extremities with 2+ DTRs and 2+ pulses.  Lymph: No lymphadenopathy of posterior or anterior cervical chain or axillae bilaterally.  Gait normal with good balance and coordination.  MSK:  Non tender with full range of motion and good stability and symmetric strength and tone of shoulders, elbows, wrist, hip, knee and  ankles bilaterally.  Back Exam:  Inspection: Unremarkable  Motion: Flexion 45 deg, Extension 45 deg, Side Bending to 45 deg bilaterally,  Rotation to 45 deg bilaterally  SLR laying: Negative  XSLR laying: Negative  Palpable tenderness: None. FABER: negative. Sensory change: Gross sensation intact to all lumbar and sacral dermatomes.  Reflexes: 2+ at both patellar tendons, 2+ at achilles tendons, Babinski's downgoing.  Strength at foot  Plantar-flexion: 5/5  Dorsi-flexion: 5/5 Eversion: 5/5 Inversion: 5/5  Leg strength  Quad: 5/5 Hamstring: 5/5 Hip flexor: 5/5 Hip abductors: 5/5  Gait unremarkable.    Impression and Recommendations:     This case required medical decision making of moderate complexity.      Note: This dictation was prepared with Dragon dictation along with smaller phrase technology. Any transcriptional errors that result from this process are unintentional.

## 2016-07-11 NOTE — Progress Notes (Deleted)
   Subjective:    Patient ID: Cheryl Hampton, female    DOB: 1985/08/30, 31 y.o.   MRN: [id number]  HPI Pt returns for f/u of autoimmune thyroid dz (she was rx'ed for hypothyroidism 2012-2016; synthroid was d/c'ed in late 2016; she has never had dedicated thyroid imaging; she developed hyperthyroidism in 2017; followed by recurrent hypothyroidism).  She still has slight hair loss and weight gain.  She takes synthroid as rx'ed.  She is not breast-feeding   Review of Systems     Objective:   Physical Exam VITAL SIGNS:  See vs page GENERAL: no distress NECK: There is no palpable thyroid enlargement.  No thyroid nodule is palpable.  No palpable lymphadenopathy at the anterior neck.       Assessment & Plan:

## 2016-07-14 ENCOUNTER — Ambulatory Visit: Payer: Self-pay | Admitting: Endocrinology

## 2016-07-17 ENCOUNTER — Other Ambulatory Visit: Payer: Self-pay | Admitting: Internal Medicine

## 2016-07-17 NOTE — Telephone Encounter (Signed)
MD out of office pls advise on refill.../lmb 

## 2016-07-21 NOTE — Telephone Encounter (Signed)
Pls forward to Dr Quay Burow

## 2016-07-22 NOTE — Telephone Encounter (Signed)
Already filled

## 2016-07-23 ENCOUNTER — Ambulatory Visit: Payer: Self-pay | Admitting: Family Medicine

## 2016-07-23 ENCOUNTER — Telehealth: Payer: Self-pay | Admitting: Emergency Medicine

## 2016-07-23 MED ORDER — BUTALBITAL-APAP-CAFFEINE 50-325-40 MG PO TABS
1.0000 | ORAL_TABLET | ORAL | 0 refills | Status: DC | PRN
Start: 2016-07-23 — End: 2018-07-14

## 2016-07-23 NOTE — Progress Notes (Signed)
[facility] [address], [place] [address] Phone: [phone] Subjective:    I'm seeing this patient by the request  of:  Binnie Rail, MD   CC: Low back pain.  [id number]  Cheryl Hampton is a 31 y.o. female coming in with complaint of low back pain. Patient sates that she has had a dull, throbbing aching pains and she has had her children and had an epidural. Patient states that the second epidural seem to cause a constant pain now. States that the pain seems to be worse after activities such as being on the floor bending repetitively. States every morning has severe amount of pain in the back. Has seen a chiropractor in the past with very minimal benefit. Denies any significant radiation down the legs, any numbness or tingling or any weakness of the lower extremity.Rates severity of pain as 5 out of 10.     Past Medical History:  Diagnosis Date  . Depression    Post partum took lexapro and wellbutrin and it helped  . Hypothyroid 2012 dx   post partum hyperthyroid  . Migraine headache    Past Surgical History:  Procedure Laterality Date  . MOUTH SURGERY    . Removal (R) Fallopian tube  09/2009   Social History   Social History  . Marital status: Legally Separated    Spouse name: N/A  . Number of children: N/A  . Years of education: N/A   Social History Main Topics  . Smoking status: Never Smoker  . Smokeless tobacco: Never Used  . Alcohol use No  . Drug use: No  . Sexual activity: Yes   Other Topics Concern  . None   Social History Narrative  . None   No Known Allergies Family History  Problem Relation Age of Onset  . Alcohol abuse Maternal Uncle   . Pancreatic cancer Maternal Grandmother   . Stroke Maternal Grandfather   . Hypertension Mother   . Diabetes Mother   . Hyperlipidemia Mother   . Thyroid disease Neg Hx     Past medical history, social, surgical and family history all reviewed in electronic medical  record.  No pertanent information unless stated regarding to the chief complaint.   Review of Systems:Review of systems updated and as accurate as of 07/24/16  No headache, visual changes, nausea, vomiting, diarrhea, constipation, dizziness, abdominal pain, skin rash, fevers, chills, night sweats, weight loss, swollen lymph nodes, body aches, joint swelling, chest pain, shortness of breath, mood changes.   Objective  Blood pressure (!) 140/108, pulse 76, height 5\' 4"  (1.626 m), weight 217 lb (98.4 kg), unknown if currently breastfeeding. Systems examined below as of 07/24/16   General: No apparent distress alert and oriented x3 mood and affect normal, dressed appropriately.  HEENT: Pupils equal, extraocular movements intact  Respiratory: Patient's speak in full sentences and does not appear short of breath  Cardiovascular: No lower extremity edema, non tender, no erythema  Skin: Warm dry intact with no signs of infection or rash on extremities or on axial skeleton.  Abdomen: Soft nontender  Neuro: Cranial nerves II through XII are intact, neurovascularly intact in all extremities with 2+ DTRs and 2+ pulses.  Lymph: No lymphadenopathy of posterior or anterior cervical chain or axillae bilaterally.  Gait normal with good balance and coordination.  MSK:  Non tender with full range of motion and good stability and symmetric strength and tone of shoulders, elbows, wrist, hip, knee and  ankles bilaterally.  Back Exam:  Inspection: Unremarkable  Motion: Flexion 45 deg, Extension 45 deg, Side Bending to 45 deg bilaterally,  Rotation to 45 deg bilaterally  SLR laying: Negative  XSLR laying: Negative  Palpable tenderness: Tender to palpation mostly over the sacroiliac joints bilaterally. Poor core strength noted.Marland Kitchen FABER: negative. Sensory change: Gross sensation intact to all lumbar and sacral dermatomes.  Reflexes: 2+ at both patellar tendons, 2+ at achilles tendons, Babinski's downgoing.    Strength at foot  Plantar-flexion: 5/5 Dorsi-flexion: 5/5 Eversion: 5/5 Inversion: 5/5  Leg strength  Quad: 5/5 Hamstring: 5/5 Hip flexor: 5/5 Hip abductors: 4/5 but symmetric Gait unremarkable.  Osteopathic findings Cervical C6 flexed rotated and side bent left T3 extended rotated and side bent right inhaled third rib T7 extended rotated and side bent left L3 flexed rotated and side bent right Sacrum left on left  Procedure note 97110; 15 minutes spent for Therapeutic exercises as stated in above notes.  This included exercises focusing on stretching, strengthening, with significant focus on eccentric aspects.  Sacroiliac Joint Mobilization and Rehab 1. Work on pretzel stretching, shoulder back and leg draped in front. 3-5 sets, 30 sec.. 2. hip abductor rotations. standing, hip flexion and rotation outward then inward. 3 sets, 15 reps. when can do comfortably, add ankle weights starting at 2 pounds.  3. cross over stretching - shoulder back to ground, same side leg crossover. 3-5 sets for 30 min..  4. rolling up and back knees to chest and rocking. 5. sacral tilt - 5 sets, hold for 5-10 seconds  Proper technique shown and discussed handout in great detail with ATC.  All questions were discussed and answered.     Impression and Recommendations:     This case required medical decision making of moderate complexity.      Note: This dictation was prepared with Dragon dictation along with smaller phrase technology. Any transcriptional errors that result from this process are unintentional.

## 2016-07-23 NOTE — Telephone Encounter (Signed)
Please advise. RX shows no print. Okay to send to pharmacy? Not sure why it was sent to [facility] the first time.

## 2016-07-23 NOTE — Addendum Note (Signed)
Addended by: Terence Lux B on: 07/23/2016 02:19 PM   Modules accepted: Orders

## 2016-07-23 NOTE — Telephone Encounter (Signed)
Pharmacy called and wants to know if you can resend the prescription for butalbital-acetaminophen-caffeine (FIORICET, ESGIC) 50-325-40 MG tablet. Please advise thanks.

## 2016-07-23 NOTE — Telephone Encounter (Signed)
Ok to send

## 2016-07-24 ENCOUNTER — Ambulatory Visit (INDEPENDENT_AMBULATORY_CARE_PROVIDER_SITE_OTHER): Payer: 59 | Admitting: Family Medicine

## 2016-07-24 ENCOUNTER — Ambulatory Visit (INDEPENDENT_AMBULATORY_CARE_PROVIDER_SITE_OTHER): Payer: 59 | Admitting: Endocrinology

## 2016-07-24 ENCOUNTER — Encounter: Payer: Self-pay | Admitting: Family Medicine

## 2016-07-24 ENCOUNTER — Encounter: Payer: Self-pay | Admitting: Endocrinology

## 2016-07-24 VITALS — BP 132/86 | HR 69 | Ht 64.0 in | Wt 226.0 lb

## 2016-07-24 DIAGNOSIS — E0590 Thyrotoxicosis, unspecified without thyrotoxic crisis or storm: Secondary | ICD-10-CM

## 2016-07-24 DIAGNOSIS — M999 Biomechanical lesion, unspecified: Secondary | ICD-10-CM | POA: Diagnosis not present

## 2016-07-24 DIAGNOSIS — M533 Sacrococcygeal disorders, not elsewhere classified: Secondary | ICD-10-CM

## 2016-07-24 LAB — TSH: TSH: 2.45 u[IU]/mL (ref 0.35–4.50)

## 2016-07-24 LAB — T4, FREE: FREE T4: 1.02 ng/dL (ref 0.60–1.60)

## 2016-07-24 MED ORDER — DICLOFENAC SODIUM 2 % TD SOLN: 2.0000 "application " | Freq: Two times a day (BID) | TRANSDERMAL | 3 refills | Status: DC

## 2016-07-24 MED ORDER — GABAPENTIN 100 MG PO CAPS: 200.0000 mg | ORAL_CAPSULE | Freq: Every day | ORAL | 3 refills | Status: DC

## 2016-07-24 NOTE — Assessment & Plan Note (Signed)
Decision today to treat with OMT was based on Physical Exam  After verbal consent patient was treated with HVLA, ME, FPR techniques in cervical, thoracic, lumbar and sacral areas  Patient tolerated the procedure well with improvement in symptoms  Patient given exercises, stretches and lifestyle modifications  See medications in patient instructions if given  Patient will follow up in 3-4 weeks  

## 2016-07-24 NOTE — Patient Instructions (Addendum)
blood tests are requested for you today.  We'll let you know about the results.   Please come back for a follow-up appointment in 4-6 months.  Please call if you want to do blood tests in between appointments.

## 2016-07-24 NOTE — Assessment & Plan Note (Signed)
I believe the patient's pain seems to be more secondary to sacral iliac dysfunction as well as muscle imbalances and core strength. Patient was given home exercises, gabapentin to take at night, topical anti-inflammatories. We discussed icing regimen and home exercises. Patient work with Product/process development scientist. Patient will come back and see me again in 4 weeks for further evaluation. Did respond well to osteopathic manipulation.

## 2016-07-24 NOTE — Progress Notes (Signed)
   Subjective:    Patient ID: Cheryl Hampton, female    DOB: 1986/06/13, 31 y.o.   MRN: [id number]  HPI Pt returns for f/u of autoimmune thyroid dz (she was rx'ed for hypothyroidism 2012-2016; synthroid was d/c'ed in late 2016; she has never had dedicated thyroid imaging; she developed hyperthyroidism in 2017; followed by recurrent hypothyroidism, so synthroid was resumed).  She again reports hair loss.  She takes synthroid as rx'ed.  She is not breast-feeding.  She says she is not at risk for another pregnancy.    Past Medical History:  Diagnosis Date  . Depression    Post partum took lexapro and wellbutrin and it helped  . Hypothyroid 2012 dx   post partum hyperthyroid  . Migraine headache     Past Surgical History:  Procedure Laterality Date  . MOUTH SURGERY    . Removal (R) Fallopian tube  09/2009    Social History   Social History  . Marital status: Legally Separated    Spouse name: N/A  . Number of children: N/A  . Years of education: N/A   Occupational History  . Not on file.   Social History Main Topics  . Smoking status: Never Smoker  . Smokeless tobacco: Never Used  . Alcohol use No  . Drug use: No  . Sexual activity: Yes   Other Topics Concern  . Not on file   Social History Narrative  . No narrative on file    Current Outpatient Prescriptions on File Prior to Visit  Medication Sig Dispense Refill  . butalbital-acetaminophen-caffeine (FIORICET, ESGIC) 50-325-40 MG tablet Take 1 tablet by mouth every 4 (four) hours as needed for headache. 90 tablet 0  . levothyroxine (SYNTHROID, LEVOTHROID) 100 MCG tablet Take 1 tablet (100 mcg total) by mouth daily. 90 tablet 3  . UNABLE TO FIND 2 (two) times daily. Viviscal    . Diclofenac Sodium (PENNSAID) 2 % SOLN Place 2 application onto the skin 2 (two) times daily. 112 g 3  . gabapentin (NEURONTIN) 100 MG capsule Take 2 capsules (200 mg total) by mouth at bedtime. 60 capsule 3   No current facility-administered  medications on file prior to visit.     No Known Allergies  Family History  Problem Relation Age of Onset  . Alcohol abuse Maternal Uncle   . Pancreatic cancer Maternal Grandmother   . Stroke Maternal Grandfather   . Hypertension Mother   . Diabetes Mother   . Hyperlipidemia Mother   . Thyroid disease Neg Hx     BP 132/86   Pulse 69   Ht 5\' 4"  (1.626 m)   Wt 226 lb (102.5 kg)   SpO2 97%   BMI 38.79 kg/m    Review of Systems Denies dry skin.     Objective:   Physical Exam VITAL SIGNS:  See vs page.  GENERAL: no distress. NECK: There is no palpable thyroid enlargement.  No thyroid nodule is palpable.  No palpable lymphadenopathy at the anterior neck.   Lab Results  Component Value Date   TSH 2.45 07/24/2016   T3TOTAL 96.6 05/04/2013      Assessment & Plan:  Hypothyroidism: well-replaced. Please continue the same medication

## 2016-07-24 NOTE — Patient Instructions (Signed)
Good to see you  Ice 20 minutes 2 times daily. Usually after activity and before bed. pennsaid pinkie amount topically 2 times daily as needed.  Exercises 3 times a week.  Gabapentin 100-200mg  at night.  Over the counter  Vitamin D 2000 IU daily   Turmeric 500mg  1-2 times daily  See me again in 4 weeks.

## 2016-07-31 ENCOUNTER — Ambulatory Visit: Payer: Self-pay | Admitting: Endocrinology

## 2016-08-21 ENCOUNTER — Ambulatory Visit: Payer: Self-pay | Admitting: Family Medicine

## 2016-08-21 DIAGNOSIS — Z0100 Encounter for examination of eyes and vision without abnormal findings: Secondary | ICD-10-CM | POA: Diagnosis not present

## 2016-11-20 ENCOUNTER — Ambulatory Visit: Payer: Self-pay | Admitting: Endocrinology

## 2016-11-27 ENCOUNTER — Encounter: Payer: Self-pay | Admitting: Endocrinology

## 2016-11-27 ENCOUNTER — Ambulatory Visit (INDEPENDENT_AMBULATORY_CARE_PROVIDER_SITE_OTHER): Payer: 59 | Admitting: Endocrinology

## 2016-11-27 VITALS — BP 128/88 | HR 85 | Ht 64.0 in | Wt 236.0 lb

## 2016-11-27 DIAGNOSIS — E039 Hypothyroidism, unspecified: Secondary | ICD-10-CM

## 2016-11-27 LAB — TSH: TSH: 1.2 u[IU]/mL (ref 0.35–4.50)

## 2016-11-27 NOTE — Patient Instructions (Addendum)
A thyroid blood test is requested for you today.  We'll let you know about the results.   Please come back for a follow-up appointment in 6 months.  Please call if you want to do a blood test in between appointments.

## 2016-11-27 NOTE — Progress Notes (Signed)
   Subjective:    Patient ID: Cheryl Hampton, female    DOB: 1985/11/30, 31 y.o.   MRN: [id number]  HPI Pt returns for f/u of autoimmune thyroid dz (she was rx'ed for hypothyroidism 2012-2016; synthroid was d/c'ed in late 2016; she has never had dedicated thyroid imaging; she developed hyperthyroidism in 2017; followed by recurrent hypothyroidism, so synthroid was resumed).  She reports hair loss is less now.  She takes synthroid as rx'ed.  She says she is not at risk for another pregnancy.   Past Medical History:  Diagnosis Date  . Depression    Post partum took lexapro and wellbutrin and it helped  . Hypothyroid 2012 dx   post partum hyperthyroid  . Migraine headache     Past Surgical History:  Procedure Laterality Date  . MOUTH SURGERY    . Removal (R) Fallopian tube  09/2009    Social History   Social History  . Marital status: Legally Separated    Spouse name: N/A  . Number of children: N/A  . Years of education: N/A   Occupational History  . Not on file.   Social History Main Topics  . Smoking status: Never Smoker  . Smokeless tobacco: Never Used  . Alcohol use No  . Drug use: No  . Sexual activity: Yes   Other Topics Concern  . Not on file   Social History Narrative  . No narrative on file    Current Outpatient Prescriptions on File Prior to Visit  Medication Sig Dispense Refill  . butalbital-acetaminophen-caffeine (FIORICET, ESGIC) 50-325-40 MG tablet Take 1 tablet by mouth every 4 (four) hours as needed for headache. 90 tablet 0  . gabapentin (NEURONTIN) 100 MG capsule Take 2 capsules (200 mg total) by mouth at bedtime. 60 capsule 3  . levothyroxine (SYNTHROID, LEVOTHROID) 100 MCG tablet Take 1 tablet (100 mcg total) by mouth daily. 90 tablet 3  . UNABLE TO FIND 2 (two) times daily. Viviscal     No current facility-administered medications on file prior to visit.     No Known Allergies  Family History  Problem Relation Age of Onset  . Alcohol abuse  Maternal Uncle   . Pancreatic cancer Maternal Grandmother   . Stroke Maternal Grandfather   . Hypertension Mother   . Diabetes Mother   . Hyperlipidemia Mother   . Thyroid disease Neg Hx     BP 128/88   Pulse 85   Ht 5\' 4"  (1.626 m)   Wt 236 lb (107 kg)   SpO2 98%   BMI 40.51 kg/m    Review of Systems Denies heat intolerance    Objective:   Physical Exam VITAL SIGNS:  See vs page GENERAL: no distress NECK: There is no palpable thyroid enlargement.  No thyroid nodule is palpable.  No palpable lymphadenopathy at the anterior neck.       Assessment & Plan:  Hypothyroidism: due for recheck.  Patient Instructions  A thyroid blood test is requested for you today.  We'll let you know about the results.   Please come back for a follow-up appointment in 6 months.  Please call if you want to do a blood test in between appointments.

## 2016-11-28 ENCOUNTER — Encounter: Payer: Self-pay | Admitting: Internal Medicine

## 2017-02-04 ENCOUNTER — Encounter: Payer: Self-pay | Admitting: Internal Medicine

## 2017-02-04 NOTE — Progress Notes (Signed)
Subjective:    Patient ID: Cheryl Hampton, female    DOB: [birth year], 31 y.o.   MRN: [id number]  HPI She is here for an acute visit.   Sciatica:  It started one month ago.  It starts in her right buttock / lower back region and goes down her right leg.  It is getting worse.  Over this past weekend it gave out on her.  It the pain is severe and she puts more pressure on it it feels like it is going to give out on her.  She denies numbness/tinlging.  The pain radiates to just above the knee.   She did have this pain with both her pregnancies.  She denies lower back pain.   She has tried back exercises from you tube and they heledp some, but it is very transient.  Once she is busy at work it gets worse.  She stands all day.  She walks a lot during the day.  She takes advil 800 mg 2-3 times a day.  She took gabapentin once and it made her feel funny a couple of hours after she took it.   She tried cold and heat.     Obesity:  She has been exercising regularly, but recently has had to decrease her exercise due to her back pain.  She has made diet changes.  She wants to try a weight loss medication.  She is interested in [place].     Medications and allergies reviewed with patient and updated if appropriate.  Patient Active Problem List   Diagnosis Date Noted  . 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 Nonallopathic lesion of sacral region 07/24/2016  . Nonallopathic lesion of thoracic region 07/24/2016  . Nonallopathic lesion of lumbosacral region 07/24/2016  . Chronic midline low back pain without sciatica 06/12/2016  . Hair loss 06/12/2016  . Hypothyroidism 06/12/2016  . Hyperthyroidism 01/14/2016  . Obesity 01/09/2013  . Depression   . Migraine headache 02/06/2011    Current Outpatient Prescriptions on File Prior to Visit  Medication Sig Dispense Refill  . butalbital-acetaminophen-caffeine (FIORICET, ESGIC) 50-325-40 MG tablet Take 1 tablet by mouth every 4 (four) hours as  needed for headache. 90 tablet 0  . levothyroxine (SYNTHROID, LEVOTHROID) 100 MCG tablet Take 1 tablet (100 mcg total) by mouth daily. 90 tablet 3   No current facility-administered medications on file prior to visit.     Past Medical History:  Diagnosis Date  . Depression    Post partum took lexapro and wellbutrin and it helped  . Hypothyroid 2012 dx   post partum hyperthyroid  . Migraine headache     Past Surgical History:  Procedure Laterality Date  . MOUTH SURGERY    . Removal (R) Fallopian tube  09/2009    Social History   Social History  . Marital status: Legally Separated    Spouse name: N/A  . Number of children: N/A  . Years of education: N/A   Social History Main Topics  . Smoking status: Never Smoker  . Smokeless tobacco: Never Used  . Alcohol use No  . Drug use: No  . Sexual activity: Yes   Other Topics Concern  . None   Social History Narrative  . None    Family History  Problem Relation Age of Onset  . Alcohol abuse Maternal Uncle   . Pancreatic cancer Maternal Grandmother   . Stroke Maternal Grandfather   . Hypertension Mother   . Diabetes Mother   .  Hyperlipidemia Mother   . Thyroid disease Neg Hx     Review of Systems  Constitutional: Negative for chills and fever.  Gastrointestinal:       No incontince  Genitourinary:       No incontince  Musculoskeletal: Positive for myalgias. Negative for back pain.  Neurological: Positive for weakness. Negative for numbness.       Objective:   Vitals:   02/05/17 1434  BP: 130/88  Pulse: 88  Temp: 97.7 F (36.5 C)  SpO2: 100%   Filed Weights   02/05/17 1434  Weight: 234 lb (106.1 kg)   Body mass index is 40.17 kg/m.  Wt Readings from Last 3 Encounters:  02/05/17 234 lb (106.1 kg)  11/27/16 236 lb (107 kg)  07/24/16 217 lb (98.4 kg)     Physical Exam  Constitutional: She appears well-developed and well-nourished. No distress.  HENT:  Head: Normocephalic and atraumatic.    Musculoskeletal: She exhibits no edema.  No muscle spasms, no lower back pain or lumbar spine pain with palpation  Neurological:  Normal sensation and strength b/l LE, gait normal, neg straight leg raise  Skin: Skin is warm and dry. She is not diaphoretic.          Assessment & Plan:   See Problem List for Assessment and Plan of chronic medical problems.

## 2017-02-04 NOTE — Patient Instructions (Addendum)
Avoid any bending, lifting and twisting.    Continue the ice, heat  Start meloxicam 15 mg daily.   Take tylenol 1000 mg three times a day.   Make an appointment with Dr Raeford Razor next week for further evaluation.   Belviq was sent to your pharmacy.

## 2017-02-05 ENCOUNTER — Encounter: Payer: Self-pay | Admitting: Internal Medicine

## 2017-02-05 ENCOUNTER — Ambulatory Visit (INDEPENDENT_AMBULATORY_CARE_PROVIDER_SITE_OTHER): Payer: 59 | Admitting: Internal Medicine

## 2017-02-05 VITALS — BP 130/88 | HR 88 | Temp 97.7°F | Ht 64.0 in | Wt 234.0 lb

## 2017-02-05 DIAGNOSIS — Z6841 Body Mass Index (BMI) 40.0 and over, adult: Secondary | ICD-10-CM | POA: Diagnosis not present

## 2017-02-05 DIAGNOSIS — M5431 Sciatica, right side: Secondary | ICD-10-CM | POA: Diagnosis not present

## 2017-02-05 MED ORDER — LORCASERIN HCL 10 MG PO TABS: 10.0000 mg | ORAL_TABLET | Freq: Two times a day (BID) | ORAL | 5 refills | Status: DC

## 2017-02-05 MED ORDER — MELOXICAM 15 MG PO TABS: 15.0000 mg | ORAL_TABLET | Freq: Every day | ORAL | 0 refills | Status: DC

## 2017-02-05 NOTE — Assessment & Plan Note (Signed)
She has been exercising and has revised her diet Once back pain improved, continue regular exercise Decreased calories, avoid sugars/carbs Wants to try Belviq - will need PA  rx sent Fu in few months to see progress

## 2017-02-05 NOTE — Assessment & Plan Note (Signed)
Present for one month, has had prior episodes Start mobic 15 mg daily, no other nsaids Heat, cold Did not tolerated gabapentin Discussed activities to avoid Continue back exercises Deferred steroids Referred to Dr Raeford Razor

## 2017-02-10 ENCOUNTER — Ambulatory Visit (INDEPENDENT_AMBULATORY_CARE_PROVIDER_SITE_OTHER): Payer: 59 | Admitting: Family Medicine

## 2017-02-10 ENCOUNTER — Encounter: Payer: Self-pay | Admitting: Family Medicine

## 2017-02-10 ENCOUNTER — Telehealth: Payer: Self-pay | Admitting: Emergency Medicine

## 2017-02-10 DIAGNOSIS — M5431 Sciatica, right side: Secondary | ICD-10-CM | POA: Diagnosis not present

## 2017-02-10 NOTE — Patient Instructions (Addendum)
Thank you for coming in,   Try obtaining some over-the-counter orthotics from [facility]. Please continue exercising by using an elliptical, recumbent bike, or doing water aerobics. Yoga is also helpful. If he had no improvement in her symptoms please follow-up with me in 3-4 weeks.   Please feel free to call with any questions or concerns at any time, at [phone]. --Dr. Raeford Razor

## 2017-02-10 NOTE — Progress Notes (Signed)
Cheryl Hampton - 31 y.o. female MRN [id number]  Date of birth: [birth year]  SUBJECTIVE:  Including CC & ROS.  Chief Complaint  Patient presents with  . Leg Pain    right leg, patient saw Dr. Quay Burow last week for sciatic pain and states pver the weekend when she would stand up or put pressure on leg she would fall. patient states she fell twice this weekend. meloxicam with the tylenol has been helping, but patient is worried when she starts working out again there will be another flare up    [name] is a 31 year old femalepresenting with right-sided radicular symptoms. She reports the symptoms are worse with walking and doing abduction exercises. She reports the symptoms have occurred to previous times. They occurred while she was pregnant. The symptoms are throbbing and originating in her buttock and radiate to the lateral aspect of her thigh. She denies any prior injury when this first started. She has no prior surgery on her back or her thigh or hip. She has gained weight recently. She denies any weakness or numbness. 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   Patient was seen by Dr. Quay Burow on 8/17 for sciatica in the right side. At that time she was initiated on mobic and advised to continue exercising. She has deferred steroids and does not tolerate gabapentin. An x-ray of her sacrum/coccyx was performed on 01/02/14. I have independently reviewed these x-rays and these are normal in appearance.  Review of Systems  Cardiovascular: Negative for leg swelling.  Musculoskeletal: Negative for back pain and gait problem.  Skin: Negative for rash.  Neurological: Negative for weakness and numbness.  Hematological: Negative for adenopathy.  otherwise negative   HISTORY: Past Medical, Surgical, Social, and Family History Reviewed & Updated per EMR.   Pertinent Historical Findings include:  Past Medical History:  Diagnosis Date  . Depression    Post partum took lexapro and  wellbutrin and it helped  . Hypothyroid 2012 dx   post partum hyperthyroid  . Migraine headache     Past Surgical History:  Procedure Laterality Date  . MOUTH SURGERY    . Removal (R) Fallopian tube  09/2009    Allergies  Allergen Reactions  . Gabapentin     Made her feel funny    Family History  Problem Relation Age of Onset  . Alcohol abuse Maternal Uncle   . Pancreatic cancer Maternal Grandmother   . Stroke Maternal Grandfather   . Hypertension Mother   . Diabetes Mother   . Hyperlipidemia Mother   . Thyroid disease Neg Hx      Social History   Social History  . Marital status: Legally Separated    Spouse name: N/A  . Number of children: N/A  . Years of education: N/A   Occupational History  . Not on file.   Social History Main Topics  . Smoking status: Never Smoker  . Smokeless tobacco: Never Used  . Alcohol use No  . Drug use: No  . Sexual activity: Yes   Other Topics Concern  . Not on file   Social History Narrative  . No narrative on file     PHYSICAL EXAM:  VS: BP 138/90 (BP Location: Right Arm, Patient Position: Sitting, Cuff Size: Large)   Pulse 73   Temp 98 F (36.7 C) (Oral)   Ht 5\' 4"  (1.626 m)   Wt 238 lb (108 kg)   SpO2 99%   BMI 40.85 kg/m  Physical Exam Gen: NAD, alert, cooperative with exam, well-appearing ENT: normal lips, normal nasal mucosa,  Eye: normal EOM, normal conjunctiva and lids CV:  no edema, +2 pedal pulses   Resp: no accessory muscle use, non-labored,  Skin: no rashes, no areas of induration  Neuro: normal tone, normal sensation to touch Psych:  normal insight, alert and oriented MSK:  Back: No tenderness to palpation over the SI joint, greater trochanter, paraspinal muscle or piriformis. Normal flexion and extension. Normal hip internal and external rotation. Normal strength resistance with hip flexion. Normal knee flexion and extension. Some weakness with hip abduction strength testing but does not  exacerbate pain Normal FADIR and Faber test  Negative straight leg raise bilaterally. She does have blunted shorter feet which could contribute to her symptoms. No leg length discrepancy Neurovascularly intact.   ASSESSMENT & PLAN:   I spent 25 minutes with this patient, greater than 50% was face-to-face time counseling regarding the below diagnosis.   Sciatica of right side It appears that this is more piriformis as opposed to nerve compression in her back. There is concern that there was more gluteus medius occurring but her testing for this did not exacerbate her symptoms.  - Can continue the mobic - Provided home exercises - If no improvement would consider imaging and referral to physical therapy. Could perform an ultrasound to evaluate the piriformis can consider an injection into this area if warranted.  Obesity Likely has some contribution to her symptoms. - Educated. Counseled on aquatic therapy - Could refer for weight management education.

## 2017-02-10 NOTE — Telephone Encounter (Signed)
PA has been completed for Belviq. Key: [id number]. Awaiting response.

## 2017-02-10 NOTE — Assessment & Plan Note (Signed)
Likely has some contribution to her symptoms. - Educated. Counseled on aquatic therapy - Could refer for weight management education.

## 2017-02-10 NOTE — Assessment & Plan Note (Addendum)
It appears that this is more piriformis as opposed to nerve compression in her back. There is concern that there was more gluteus medius occurring but her testing for this did not exacerbate her symptoms.  - Can continue the mobic - Provided home exercises - If no improvement would consider imaging and referral to physical therapy. Could perform an ultrasound to evaluate the piriformis can consider an injection into this area if warranted.

## 2017-02-11 ENCOUNTER — Encounter: Payer: Self-pay | Admitting: Internal Medicine

## 2017-02-16 ENCOUNTER — Encounter: Payer: Self-pay | Admitting: Internal Medicine

## 2017-02-19 NOTE — Telephone Encounter (Signed)
PA denied. Please advise on alternative.

## 2017-02-21 NOTE — Telephone Encounter (Signed)
We could try phentermine - it is only short term and she would have to follow up after one month to make sure her BP did not go up.  This medication can only be used for three months and not a long term solution, but may help initially and help get a different long term medication approved.    Most common side effects are dry mouth, insomnia, increase in energy, palpitations, headaches.

## 2017-02-23 NOTE — Telephone Encounter (Signed)
Relayed message through [place]. Waiting on pts response.

## 2017-02-25 MED ORDER — PHENTERMINE HCL 30 MG PO CAPS: 30.0000 mg | ORAL_CAPSULE | ORAL | 0 refills | Status: DC

## 2017-02-25 NOTE — Telephone Encounter (Signed)
rx printed.  She should follow up with me in the office in 4 weeks.  She should call sooner if side effects.

## 2017-02-26 ENCOUNTER — Telehealth: Payer: Self-pay | Admitting: Emergency Medicine

## 2017-02-26 MED ORDER — PHENTERMINE HCL 37.5 MG PO TABS: 37.5000 mg | ORAL_TABLET | Freq: Every day | ORAL | 0 refills | Status: DC

## 2017-02-26 MED ORDER — PHENTERMINE HCL 30 MG PO TBDP: 30.0000 mg | ORAL_TABLET | Freq: Every day | ORAL | 0 refills | Status: DC

## 2017-02-26 NOTE — Telephone Encounter (Signed)
RX faxed to [facility] 

## 2017-02-26 NOTE — Telephone Encounter (Signed)
Spoke with pharmacy, verbal orders given for 73.5 mg tablet.

## 2017-02-26 NOTE — Telephone Encounter (Signed)
Kendyl from [facility] called stating that the pt would like to change it to 37.5 tabs instead due to the cost of the 30mg  capsules.

## 2017-03-25 NOTE — Progress Notes (Signed)
Subjective:    Patient ID: Cheryl Hampton, female    DOB: 07-Jun-1986, 31 y.o.   MRN: [id number]  HPI The patient is here for follow up.  Obesity:  She exercises 3 days a week.  We tried to get Belviq approved, but her insurance did not approve it.  We started Phentermine the end of August at 37.5 mg daily.  She has noticed decreased appetite.  She initially was a little anxious but that went away. She denies other side effects.  She has lost weight.   Wt Readings from Last 3 Encounters:  03/26/17 228 lb (103.4 kg)  02/10/17 238 lb (108 kg)  02/05/17 234 lb (106.1 kg)   Her job has been more stressful and her BP has been up this week.  Prior to this week her BP was 120's/80's.    She is happy with the results of the medication and would like to continue it.   Medications and allergies reviewed with patient and updated if appropriate.  Patient Active Problem List   Diagnosis Date Noted  . Sciatica of right side 02/05/2017  . 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 Nonallopathic lesion of sacral region 07/24/2016  . Nonallopathic lesion of thoracic region 07/24/2016  . Nonallopathic lesion of lumbosacral region 07/24/2016  . Chronic midline low back pain without sciatica 06/12/2016  . Hair loss 06/12/2016  . Hypothyroidism 06/12/2016  . Hyperthyroidism 01/14/2016  . Obesity 01/09/2013  . Depression   . Migraine headache 02/06/2011    Current Outpatient Prescriptions on File Prior to Visit  Medication Sig Dispense Refill  . butalbital-acetaminophen-caffeine (FIORICET, ESGIC) 50-325-40 MG tablet Take 1 tablet by mouth every 4 (four) hours as needed for headache. 90 tablet 0  . levothyroxine (SYNTHROID, LEVOTHROID) 100 MCG tablet Take 1 tablet (100 mcg total) by mouth daily. 90 tablet 3  . meloxicam (MOBIC) 15 MG tablet Take 1 tablet (15 mg total) by mouth daily. 30 tablet 0  . phentermine (ADIPEX-P) 37.5 MG tablet Take 1 tablet (37.5 mg total) by mouth daily before  breakfast. 30 tablet 0   No current facility-administered medications on file prior to visit.     Past Medical History:  Diagnosis Date  . Depression    Post partum took lexapro and wellbutrin and it helped  . Hypothyroid 2012 dx   post partum hyperthyroid  . Migraine headache     Past Surgical History:  Procedure Laterality Date  . MOUTH SURGERY    . Removal (R) Fallopian tube  09/2009    Social History   Social History  . Marital status: Legally Separated    Spouse name: N/A  . Number of children: N/A  . Years of education: N/A   Social History Main Topics  . Smoking status: Never Smoker  . Smokeless tobacco: Never Used  . Alcohol use No  . Drug use: No  . Sexual activity: Yes   Other Topics Concern  . Not on file   Social History Narrative  . No narrative on file    Family History  Problem Relation Age of Onset  . Alcohol abuse Maternal Uncle   . Pancreatic cancer Maternal Grandmother   . Stroke Maternal Grandfather   . Hypertension Mother   . Diabetes Mother   . Hyperlipidemia Mother   . Thyroid disease Neg Hx     Review of Systems  Constitutional: Negative for fever.  HENT:       Dry mouth  Eyes: Negative for  visual disturbance.  Respiratory: Negative for shortness of breath.   Cardiovascular: Negative for chest pain, palpitations and leg swelling.  Neurological: Negative for dizziness, light-headedness and headaches.       Objective:   Vitals:   03/26/17 1057  BP: (!) 144/92  Pulse: 87  Resp: 16  Temp: 98.6 F (37 C)  SpO2: 98%   Wt Readings from Last 3 Encounters:  03/26/17 228 lb (103.4 kg)  02/10/17 238 lb (108 kg)  02/05/17 234 lb (106.1 kg)   Body mass index is 39.14 kg/m.  BP Readings from Last 3 Encounters:  03/26/17 (!) 144/92  02/10/17 138/90  02/05/17 130/88      Physical Exam    Constitutional: Appears well-developed and well-nourished. No distress.  HENT:  Head: Normocephalic and atraumatic.  Neck: Neck  supple. No tracheal deviation present. No thyromegaly present.  No cervical lymphadenopathy Cardiovascular: Normal rate, regular rhythm and normal heart sounds.   No murmur heard. No carotid bruit .  No edema Pulmonary/Chest: Effort normal and breath sounds normal. No respiratory distress. No has no wheezes. No rales.  Skin: Skin is warm and dry. Not diaphoretic.  Psychiatric: Normal mood and affect. Behavior is normal.      Assessment & Plan:    See Problem List for Assessment and Plan of chronic medical problems.

## 2017-03-26 ENCOUNTER — Ambulatory Visit (INDEPENDENT_AMBULATORY_CARE_PROVIDER_SITE_OTHER): Payer: 59 | Admitting: Internal Medicine

## 2017-03-26 ENCOUNTER — Encounter: Payer: Self-pay | Admitting: Internal Medicine

## 2017-03-26 VITALS — BP 144/92 | HR 87 | Temp 98.6°F | Resp 16 | Wt 228.0 lb

## 2017-03-26 DIAGNOSIS — Z6841 Body Mass Index (BMI) 40.0 and over, adult: Secondary | ICD-10-CM

## 2017-03-26 MED ORDER — PHENTERMINE HCL 37.5 MG PO TABS: 37.5000 mg | ORAL_TABLET | Freq: Every day | ORAL | 2 refills | Status: DC

## 2017-03-26 NOTE — Assessment & Plan Note (Signed)
Successful weight loss of about 4% in 4 weeks Continue regular exercise, decreased portions Will continue phentermine for another 3 months or so - she understands this is a temporary medication and can not be taken long term Follow up in about 2 months at CPE She will continue to monitor her BP

## 2017-03-26 NOTE — Patient Instructions (Signed)
  Medications reviewed and updated.  No changes recommended at this time.  Your prescription(s) have been submitted to your pharmacy. Please take as directed and contact our office if you believe you are having problem(s) with the medication(s).      

## 2017-04-09 ENCOUNTER — Other Ambulatory Visit: Payer: Self-pay | Admitting: Endocrinology

## 2017-04-23 DIAGNOSIS — Z1389 Encounter for screening for other disorder: Secondary | ICD-10-CM | POA: Diagnosis not present

## 2017-04-23 DIAGNOSIS — Z3046 Encounter for surveillance of implantable subdermal contraceptive: Secondary | ICD-10-CM | POA: Diagnosis not present

## 2017-04-23 DIAGNOSIS — Z130 Encounter for screening for diseases of the blood and blood-forming organs and certain disorders involving the immune mechanism: Secondary | ICD-10-CM | POA: Diagnosis not present

## 2017-04-23 DIAGNOSIS — N921 Excessive and frequent menstruation with irregular cycle: Secondary | ICD-10-CM | POA: Diagnosis not present

## 2017-04-23 DIAGNOSIS — Z01419 Encounter for gynecological examination (general) (routine) without abnormal findings: Secondary | ICD-10-CM | POA: Diagnosis not present

## 2017-05-28 ENCOUNTER — Ambulatory Visit: Payer: Self-pay | Admitting: Endocrinology

## 2017-06-10 ENCOUNTER — Encounter: Payer: Self-pay | Admitting: Internal Medicine

## 2017-06-10 MED ORDER — BUPROPION HCL ER (XL) 150 MG PO TB24: 150.0000 mg | ORAL_TABLET | Freq: Every day | ORAL | 3 refills | Status: DC

## 2017-06-10 MED ORDER — ESCITALOPRAM OXALATE 10 MG PO TABS: 10.0000 mg | ORAL_TABLET | Freq: Every day | ORAL | 1 refills | Status: DC

## 2017-06-18 ENCOUNTER — Encounter: Payer: Self-pay | Admitting: Internal Medicine

## 2017-07-02 ENCOUNTER — Ambulatory Visit: Payer: Self-pay | Admitting: Endocrinology

## 2017-07-06 ENCOUNTER — Telehealth: Payer: 59 | Admitting: Family

## 2017-07-06 DIAGNOSIS — J019 Acute sinusitis, unspecified: Secondary | ICD-10-CM

## 2017-07-06 DIAGNOSIS — B9689 Other specified bacterial agents as the cause of diseases classified elsewhere: Secondary | ICD-10-CM | POA: Diagnosis not present

## 2017-07-06 MED ORDER — AMOXICILLIN-POT CLAVULANATE 875-125 MG PO TABS: 1.0000 | ORAL_TABLET | Freq: Two times a day (BID) | ORAL | 0 refills | Status: DC

## 2017-07-06 NOTE — Progress Notes (Signed)

## 2017-07-09 ENCOUNTER — Encounter: Payer: Self-pay | Admitting: Internal Medicine

## 2017-07-12 ENCOUNTER — Ambulatory Visit: Payer: Self-pay | Admitting: *Deleted

## 2017-07-12 DIAGNOSIS — I10 Essential (primary) hypertension: Secondary | ICD-10-CM | POA: Diagnosis not present

## 2017-07-12 NOTE — Telephone Encounter (Signed)
pt recently treated with augmentin for sinus infection; she has also been taking cold medication since monday of last week (07/05/17) advil cold and sinus, and nyquil gel caps; she is having dizziness and light headedness; her BP was 154/98 at 0800 and 166/110 at 1050; normally she runs 130's/70's; nurse triage initiated and recommendation made for pt to see physician within 24 hours; pt has time constraints due to distance that she has to travel and her daughter has a MD's appointment this afternoon; pt states that she will go to urgent care; will route to [facility] [place] for notification of this encounter.  Reason for Disposition . [5] Systolic BP  >= 701 OR Diastolic >= 779 AND [3] cardiac or neurologic symptoms (e.g., chest pain, difficulty breathing, unsteady gait, blurred vision)  Answer Assessment - Initial Assessment Questions 1. BLOOD PRESSURE: "What is the blood pressure?" "Did you take at least two measurements 5 minutes apart?"     Yes 154/98 at 166/110 2. ONSET: "When did you take your blood pressure?"     Today at 0800 and 1050 3. HOW: "How did you obtain the blood pressure?" (e.g., visiting nurse, automatic home BP monitor)     Automatic cuff on right arm 4. HISTORY: "Do you have a history of high blood pressure?"     yes 5. MEDICATIONS: "Are you taking any medications for blood pressure?" "Have you missed any doses recently?"     no 6. OTHER SYMPTOMS: "Do you have any symptoms?" (e.g., headache, chest pain, blurred vision, difficulty breathing, weakness)     Light headed, dizziness 7. PREGNANCY: "Is there any chance you are pregnant?" "When was your last menstrual period?"     LMP 06/21/17  Protocols used: HIGH BLOOD PRESSURE-A-AH

## 2017-07-13 ENCOUNTER — Encounter: Payer: Self-pay | Admitting: Internal Medicine

## 2017-07-13 NOTE — Telephone Encounter (Signed)
Pe pt email. She went to [facility], was started on BP med and sees Dr Quay Burow in Feb for CPE

## 2017-08-05 NOTE — Progress Notes (Signed)
Subjective:    Patient ID: Cheryl Hampton, female    DOB: 1986-02-12, 32 y.o.   MRN: [id number]  HPI She is here for a physical exam.   She is exercising and has revised her diet.  She has lost weight.    She has no concerns.   Medications and allergies reviewed with patient and updated if appropriate.  Patient Active Problem List   Diagnosis Date Noted  . Sciatica of right side 02/05/2017  . 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 Nonallopathic lesion of sacral region 07/24/2016  . Nonallopathic lesion of thoracic region 07/24/2016  . Nonallopathic lesion of lumbosacral region 07/24/2016  . Chronic midline low back pain without sciatica 06/12/2016  . Hair loss 06/12/2016  . Hypothyroidism 06/12/2016  . Hyperthyroidism 01/14/2016  . Obesity 01/09/2013  . Depression   . Migraine headache 02/06/2011    Current Outpatient Medications on File Prior to Visit  Medication Sig Dispense Refill  . buPROPion (WELLBUTRIN XL) 150 MG 24 hr tablet Take 1 tablet (150 mg total) by mouth daily. 90 tablet 3  . butalbital-acetaminophen-caffeine (FIORICET, ESGIC) 50-325-40 MG tablet Take 1 tablet by mouth every 4 (four) hours as needed for headache. 90 tablet 0  . escitalopram (LEXAPRO) 10 MG tablet Take 1 tablet (10 mg total) by mouth daily. 90 tablet 1  . levothyroxine (SYNTHROID, LEVOTHROID) 100 MCG tablet TAKE 1 TABLET BY MOUTH ONCE DAILY 90 tablet 3   No current facility-administered medications on file prior to visit.     Past Medical History:  Diagnosis Date  . Depression    Post partum took lexapro and wellbutrin and it helped  . Hypothyroid 2012 dx   post partum hyperthyroid  . Migraine headache     Past Surgical History:  Procedure Laterality Date  . MOUTH SURGERY    . Removal (R) Fallopian tube  09/2009    Social History   Socioeconomic History  . Marital status: Legally Separated    Spouse name: None  . Number of children: None  . Years of education: None   . Highest education level: None  Social Needs  . Financial resource strain: None  . Food insecurity - worry: None  . Food insecurity - inability: None  . Transportation needs - medical: None  . Transportation needs - non-medical: None  Occupational History  . None  Tobacco Use  . Smoking status: Never Smoker  . Smokeless tobacco: Never Used  Substance and Sexual Activity  . Alcohol use: No  . Drug use: No  . Sexual activity: Yes  Other Topics Concern  . None  Social History Narrative  . None    Family History  Problem Relation Age of Onset  . Alcohol abuse Maternal Uncle   . Pancreatic cancer Maternal Grandmother   . Stroke Maternal Grandfather   . Hypertension Mother   . Diabetes Mother   . Hyperlipidemia Mother   . Thyroid disease Neg Hx     Review of Systems  Constitutional: Negative for chills and fever.  Eyes: Negative for visual disturbance.  Respiratory: Negative for cough, shortness of breath and wheezing.   Cardiovascular: Negative for chest pain, palpitations and leg swelling.  Gastrointestinal: Positive for abdominal pain (related to constipation), anal bleeding (intermittent) and constipation. Negative for blood in stool, diarrhea and nausea.       Gerd none  Genitourinary: Negative for dysuria and hematuria.  Musculoskeletal: Positive for back pain (chronic, intermittent). Negative for arthralgias and myalgias.  Skin: Negative for color change and rash.  Neurological: Positive for headaches (migraines). Negative for dizziness and light-headedness.  Psychiatric/Behavioral: Positive for dysphoric mood (controlled). The patient is not nervous/anxious.        Objective:   Vitals:   08/06/17 1338  BP: 110/82  Pulse: 86  Resp: 16  Temp: 98.6 F (37 C)  SpO2: 98%   Filed Weights   08/06/17 1338  Weight: 227 lb (103 kg)   Body mass index is 38.96 kg/m.  Wt Readings from Last 3 Encounters:  08/06/17 227 lb (103 kg)  03/26/17 228 lb (103.4 kg)    02/10/17 238 lb (108 kg)     Physical Exam Constitutional: She appears well-developed and well-nourished. No distress.  HENT:  Head: Normocephalic and atraumatic.  Right Ear: External ear normal. Normal ear canal and TM Left Ear: External ear normal.  Normal ear canal and TM Mouth/Throat: Oropharynx is clear and moist.  Eyes: Conjunctivae and EOM are normal.  Neck: Neck supple. No tracheal deviation present. No thyromegaly present.  No carotid bruit  Cardiovascular: Normal rate, regular rhythm and normal heart sounds.   No murmur heard.  No edema. Pulmonary/Chest: Effort normal and breath sounds normal. No respiratory distress. She has no wheezes. She has no rales.  Breast: deferred to Gyn Abdominal: Soft. She exhibits no distension. There is no tenderness.  Lymphadenopathy: She has no cervical adenopathy.  Skin: Skin is warm and dry. She is not diaphoretic.  Psychiatric: She has a normal mood and affect. Her behavior is normal.        Assessment & Plan:   Physical exam: Screening blood work ordered Immunizations    up-to-date Gyn  Up to date  Exercise regular  - going to the gym Weight working on weight loss Skin  No concerns Substance abuse  none  See Problem List for Assessment and Plan of chronic medical problems.

## 2017-08-05 NOTE — Patient Instructions (Addendum)
Test(s) ordered today. Your results will be released to [facility] (or called to you) after review, usually within 72hours after test completion. If any changes need to be made, you will be notified at that same time.  All other Health Maintenance issues reviewed.   All recommended immunizations and age-appropriate screenings are up-to-date or discussed.  No immunizations administered today.   Medications reviewed and updated.  No changes recommended at this time.   Please followup in 6 months   Health Maintenance, Female Adopting a healthy lifestyle and getting preventive care can go a long way to promote health and wellness. Talk with your health care provider about what schedule of regular examinations is right for you. This is a good chance for you to check in with your provider about disease prevention and staying healthy. In between checkups, there are plenty of things you can do on your own. Experts have done a lot of research about which lifestyle changes and preventive measures are most likely to keep you healthy. Ask your health care provider for more information. Weight and diet Eat a healthy diet  Be sure to include plenty of vegetables, fruits, low-fat dairy products, and lean protein.  Do not eat a lot of foods high in solid fats, added sugars, or salt.  Get regular exercise. This is one of the most important things you can do for your health. ? Most adults should exercise for at least 150 minutes each week. The exercise should increase your heart rate and make you sweat (moderate-intensity exercise). ? Most adults should also do strengthening exercises at least twice a week. This is in addition to the moderate-intensity exercise.  Maintain a healthy weight  Body mass index (BMI) is a measurement that can be used to identify possible weight problems. It estimates body fat based on height and weight. Your health care provider can help determine your BMI and help you achieve or  maintain a healthy weight.  For females 40 years of age and older: ? A BMI below 18.5 is considered underweight. ? A BMI of 18.5 to 24.9 is normal. ? A BMI of 25 to 29.9 is considered overweight. ? A BMI of 30 and above is considered obese.  Watch levels of cholesterol and blood lipids  You should start having your blood tested for lipids and cholesterol at 31 years of age, then have this test every 5 years.  You may need to have your cholesterol levels checked more often if: ? Your lipid or cholesterol levels are high. ? You are older than 31 years of age. ? You are at high risk for heart disease.  Cancer screening Lung Cancer  Lung cancer screening is recommended for adults 59-64 years old who are at high risk for lung cancer because of a history of smoking.  A yearly low-dose CT scan of the lungs is recommended for people who: ? Currently smoke. ? Have quit within the past 15 years. ? Have at least a 30-pack-year history of smoking. A pack year is smoking an average of one pack of cigarettes a day for 1 year.  Yearly screening should continue until it has been 15 years since you quit.  Yearly screening should stop if you develop a health problem that would prevent you from having lung cancer treatment.  Breast Cancer  Practice breast self-awareness. This means understanding how your breasts normally appear and feel.  It also means doing regular breast self-exams. Let your health care provider know about any changes,  no matter how small.  If you are in your 20s or 30s, you should have a clinical breast exam (CBE) by a health care provider every 1-3 years as part of a regular health exam.  If you are 40 or older, have a CBE every year. Also consider having a breast X-ray (mammogram) every year.  If you have a family history of breast cancer, talk to your health care provider about genetic screening.  If you are at high risk for breast cancer, talk to your health care  provider about having an MRI and a mammogram every year.  Breast cancer gene (BRCA) assessment is recommended for women who have family members with BRCA-related cancers. BRCA-related cancers include: ? Breast. ? Ovarian. ? Tubal. ? Peritoneal cancers.  Results of the assessment will determine the need for genetic counseling and BRCA1 and BRCA2 testing.  Cervical Cancer Your health care provider may recommend that you be screened regularly for cancer of the pelvic organs (ovaries, uterus, and vagina). This screening involves a pelvic examination, including checking for microscopic changes to the surface of your cervix (Pap test). You may be encouraged to have this screening done every 3 years, beginning at age 21.  For women ages 30-65, health care providers may recommend pelvic exams and Pap testing every 3 years, or they may recommend the Pap and pelvic exam, combined with testing for human papilloma virus (HPV), every 5 years. Some types of HPV increase your risk of cervical cancer. Testing for HPV may also be done on women of any age with unclear Pap test results.  Other health care providers may not recommend any screening for nonpregnant women who are considered low risk for pelvic cancer and who do not have symptoms. Ask your health care provider if a screening pelvic exam is right for you.  If you have had past treatment for cervical cancer or a condition that could lead to cancer, you need Pap tests and screening for cancer for at least 20 years after your treatment. If Pap tests have been discontinued, your risk factors (such as having a new sexual partner) need to be reassessed to determine if screening should resume. Some women have medical problems that increase the chance of getting cervical cancer. In these cases, your health care provider may recommend more frequent screening and Pap tests.  Colorectal Cancer  This type of cancer can be detected and often prevented.  Routine  colorectal cancer screening usually begins at 31 years of age and continues through 31 years of age.  Your health care provider may recommend screening at an earlier age if you have risk factors for colon cancer.  Your health care provider may also recommend using home test kits to check for hidden blood in the stool.  A small camera at the end of a tube can be used to examine your colon directly (sigmoidoscopy or colonoscopy). This is done to check for the earliest forms of colorectal cancer.  Routine screening usually begins at age 50.  Direct examination of the colon should be repeated every 5-10 years through 31 years of age. However, you may need to be screened more often if early forms of precancerous polyps or small growths are found.  Skin Cancer  Check your skin from head to toe regularly.  Tell your health care provider about any new moles or changes in moles, especially if there is a change in a mole's shape or color.  Also tell your health care provider if you   have a mole that is larger than the size of a pencil eraser.  Always use sunscreen. Apply sunscreen liberally and repeatedly throughout the day.  Protect yourself by wearing long sleeves, pants, a wide-brimmed hat, and sunglasses whenever you are outside.  Heart disease, diabetes, and high blood pressure  High blood pressure causes heart disease and increases the risk of stroke. High blood pressure is more likely to develop in: ? People who have blood pressure in the high end of the normal range (130-139/85-89 mm Hg). ? People who are overweight or obese. ? People who are African American.  If you are 24-25 years of age, have your blood pressure checked every 3-5 years. If you are 2 years of age or older, have your blood pressure checked every year. You should have your blood pressure measured twice-once when you are at a hospital or clinic, and once when you are not at a hospital or clinic. Record the average of the  two measurements. To check your blood pressure when you are not at a hospital or clinic, you can use: ? An automated blood pressure machine at a pharmacy. ? A home blood pressure monitor.  If you are between 42 years and 59 years old, ask your health care provider if you should take aspirin to prevent strokes.  Have regular diabetes screenings. This involves taking a blood sample to check your fasting blood sugar level. ? If you are at a normal weight and have a low risk for diabetes, have this test once every three years after 31 years of age. ? If you are overweight and have a high risk for diabetes, consider being tested at a younger age or more often. Preventing infection Hepatitis B  If you have a higher risk for hepatitis B, you should be screened for this virus. You are considered at high risk for hepatitis B if: ? You were born in a country where hepatitis B is common. Ask your health care provider which countries are considered high risk. ? Your parents were born in a high-risk country, and you have not been immunized against hepatitis B (hepatitis B vaccine). ? You have HIV or AIDS. ? You use needles to inject street drugs. ? You live with someone who has hepatitis B. ? You have had sex with someone who has hepatitis B. ? You get hemodialysis treatment. ? You take certain medicines for conditions, including cancer, organ transplantation, and autoimmune conditions.  Hepatitis C  Blood testing is recommended for: ? Everyone born from [birth year] through 1964. ? Anyone with known risk factors for hepatitis C.  Sexually transmitted infections (STIs)  You should be screened for sexually transmitted infections (STIs) including gonorrhea and chlamydia if: ? You are sexually active and are younger than 31 years of age. ? You are older than 31 years of age and your health care provider tells you that you are at risk for this type of infection. ? Your sexual activity has changed since you  were last screened and you are at an increased risk for chlamydia or gonorrhea. Ask your health care provider if you are at risk.  If you do not have HIV, but are at risk, it may be recommended that you take a prescription medicine daily to prevent HIV infection. This is called pre-exposure prophylaxis (PrEP). You are considered at risk if: ? You are sexually active and do not regularly use condoms or know the HIV status of your partner(s). ? You take drugs by injection. ?  You are sexually active with a partner who has HIV.  Talk with your health care provider about whether you are at high risk of being infected with HIV. If you choose to begin PrEP, you should first be tested for HIV. You should then be tested every 3 months for as long as you are taking PrEP. Pregnancy  If you are premenopausal and you may become pregnant, ask your health care provider about preconception counseling.  If you may become pregnant, take 400 to 800 micrograms (mcg) of folic acid every day.  If you want to prevent pregnancy, talk to your health care provider about birth control (contraception). Osteoporosis and menopause  Osteoporosis is a disease in which the bones lose minerals and strength with aging. This can result in serious bone fractures. Your risk for osteoporosis can be identified using a bone density scan.  If you are 65 years of age or older, or if you are at risk for osteoporosis and fractures, ask your health care provider if you should be screened.  Ask your health care provider whether you should take a calcium or vitamin D supplement to lower your risk for osteoporosis.  Menopause may have certain physical symptoms and risks.  Hormone replacement therapy may reduce some of these symptoms and risks. Talk to your health care provider about whether hormone replacement therapy is right for you. Follow these instructions at home:  Schedule regular health, dental, and eye exams.  Stay current  with your immunizations.  Do not use any tobacco products including cigarettes, chewing tobacco, or electronic cigarettes.  If you are pregnant, do not drink alcohol.  If you are breastfeeding, limit how much and how often you drink alcohol.  Limit alcohol intake to no more than 1 drink per day for nonpregnant women. One drink equals 12 ounces of beer, 5 ounces of wine, or 1 ounces of hard liquor.  Do not use street drugs.  Do not share needles.  Ask your health care provider for help if you need support or information about quitting drugs.  Tell your health care provider if you often feel depressed.  Tell your health care provider if you have ever been abused or do not feel safe at home. This information is not intended to replace advice given to you by your health care provider. Make sure you discuss any questions you have with your health care provider. Document Released: 12/22/2010 Document Revised: 11/14/2015 Document Reviewed: 03/12/2015 Elsevier Interactive Patient Education  2018 [employer]  

## 2017-08-06 ENCOUNTER — Encounter: Payer: Self-pay | Admitting: Internal Medicine

## 2017-08-06 ENCOUNTER — Encounter: Payer: Self-pay | Admitting: Endocrinology

## 2017-08-06 ENCOUNTER — Ambulatory Visit (INDEPENDENT_AMBULATORY_CARE_PROVIDER_SITE_OTHER): Payer: 59 | Admitting: Internal Medicine

## 2017-08-06 ENCOUNTER — Other Ambulatory Visit (INDEPENDENT_AMBULATORY_CARE_PROVIDER_SITE_OTHER): Payer: 59

## 2017-08-06 ENCOUNTER — Ambulatory Visit (INDEPENDENT_AMBULATORY_CARE_PROVIDER_SITE_OTHER): Payer: 59 | Admitting: Endocrinology

## 2017-08-06 VITALS — BP 110/82 | HR 86 | Temp 98.6°F | Resp 16 | Wt 227.0 lb

## 2017-08-06 VITALS — BP 122/90 | HR 88 | Wt 225.2 lb

## 2017-08-06 DIAGNOSIS — F32A Depression, unspecified: Secondary | ICD-10-CM

## 2017-08-06 DIAGNOSIS — Z6841 Body Mass Index (BMI) 40.0 and over, adult: Secondary | ICD-10-CM

## 2017-08-06 DIAGNOSIS — E039 Hypothyroidism, unspecified: Secondary | ICD-10-CM

## 2017-08-06 DIAGNOSIS — Z0000 Encounter for general adult medical examination without abnormal findings: Secondary | ICD-10-CM

## 2017-08-06 DIAGNOSIS — F329 Major depressive disorder, single episode, unspecified: Secondary | ICD-10-CM

## 2017-08-06 DIAGNOSIS — G43809 Other migraine, not intractable, without status migrainosus: Secondary | ICD-10-CM | POA: Diagnosis not present

## 2017-08-06 LAB — CBC WITH DIFFERENTIAL/PLATELET
BASOS ABS: 0 10*3/uL (ref 0.0–0.1)
Basophils Relative: 0.6 % (ref 0.0–3.0)
Eosinophils Absolute: 0.1 10*3/uL (ref 0.0–0.7)
Eosinophils Relative: 1.2 % (ref 0.0–5.0)
HCT: 39.2 % (ref 36.0–46.0)
Hemoglobin: 13.1 g/dL (ref 12.0–15.0)
LYMPHS ABS: 2.3 10*3/uL (ref 0.7–4.0)
Lymphocytes Relative: 28.4 % (ref 12.0–46.0)
MCHC: 33.5 g/dL (ref 30.0–36.0)
MCV: 89.3 fl (ref 78.0–100.0)
MONO ABS: 0.7 10*3/uL (ref 0.1–1.0)
Monocytes Relative: 8 % (ref 3.0–12.0)
NEUTROS PCT: 61.8 % (ref 43.0–77.0)
Neutro Abs: 5.1 10*3/uL (ref 1.4–7.7)
Platelets: 344 10*3/uL (ref 150.0–400.0)
RBC: 4.39 Mil/uL (ref 3.87–5.11)
RDW: 13.8 % (ref 11.5–15.5)
WBC: 8.2 10*3/uL (ref 4.0–10.5)

## 2017-08-06 LAB — COMPREHENSIVE METABOLIC PANEL
ALK PHOS: 48 U/L (ref 39–117)
ALT: 38 U/L — AB (ref 0–35)
AST: 25 U/L (ref 0–37)
Albumin: 4.2 g/dL (ref 3.5–5.2)
BUN: 13 mg/dL (ref 6–23)
CO2: 29 mEq/L (ref 19–32)
Calcium: 9 mg/dL (ref 8.4–10.5)
Chloride: 101 mEq/L (ref 96–112)
Creatinine, Ser: 0.6 mg/dL (ref 0.40–1.20)
GFR: 124.5 mL/min (ref >60.00)
GLUCOSE: 90 mg/dL (ref 70–99)
Potassium: 3.9 mEq/L (ref 3.5–5.1)
SODIUM: 137 meq/L (ref 135–145)
Total Bilirubin: 0.6 mg/dL (ref 0.2–1.2)
Total Protein: 7.3 g/dL (ref 6.0–8.3)

## 2017-08-06 LAB — LIPID PANEL
Cholesterol: 150 mg/dL (ref 0–200)
HDL: 37.7 mg/dL — AB (ref >39.00)
LDL Cholesterol: 86 mg/dL (ref 0–99)
NONHDL: 111.97
Total CHOL/HDL Ratio: 4
Triglycerides: 132 mg/dL (ref 0.0–149.0)
VLDL: 26.4 mg/dL (ref 0.0–40.0)

## 2017-08-06 LAB — T4, FREE: Free T4: 1.07 ng/dL (ref 0.60–1.60)

## 2017-08-06 LAB — TSH: TSH: 1.38 u[IU]/mL (ref 0.35–4.50)

## 2017-08-06 NOTE — Assessment & Plan Note (Signed)
Sees dr Loanne Drilling today Management per Dr Loanne Drilling

## 2017-08-06 NOTE — Patient Instructions (Signed)
blood tests are requested for you today.  We'll let you know about the results.   I would be happy to see you back here as needed.  You should have the blood test checked approx twice per year.

## 2017-08-06 NOTE — Assessment & Plan Note (Signed)
Working on weight loss - has lost weight Encouraged her to continue regular exercise Has revised her diet Follow up in 6 months

## 2017-08-06 NOTE — Assessment & Plan Note (Signed)
Controlled, stable Continue current dose of medication  

## 2017-08-06 NOTE — Progress Notes (Signed)
Subjective:    Patient ID: Cheryl Hampton, female    DOB: September 12, 1985, 32 y.o.   MRN: [id number]  HPI Pt returns for f/u of autoimmune thyroid dz (she was rx'ed for hypothyroidism 2012-2016; synthroid was d/c'ed in late 2016; she has never had dedicated thyroid imaging; she developed hyperthyroidism in 2017; followed by recurrent hypothyroidism, so synthroid was resumed).  She says hair loss persists.  She takes synthroid as rx'ed.  She says she is not at risk for another pregnancy.   Past Medical History:  Diagnosis Date  . Depression    Post partum took lexapro and wellbutrin and it helped  . Hypothyroid 2012 dx   post partum hyperthyroid  . Migraine headache     Past Surgical History:  Procedure Laterality Date  . MOUTH SURGERY    . Removal (R) Fallopian tube  09/2009    Social History   Socioeconomic History  . Marital status: Legally Separated    Spouse name: Not on file  . Number of children: Not on file  . Years of education: Not on file  . Highest education level: Not on file  Social Needs  . Financial resource strain: Not on file  . Food insecurity - worry: Not on file  . Food insecurity - inability: Not on file  . Transportation needs - medical: Not on file  . Transportation needs - non-medical: Not on file  Occupational History  . Not on file  Tobacco Use  . Smoking status: Never Smoker  . Smokeless tobacco: Never Used  Substance and Sexual Activity  . Alcohol use: No  . Drug use: No  . Sexual activity: Yes  Other Topics Concern  . Not on file  Social History Narrative  . Not on file    Current Outpatient Medications on File Prior to Visit  Medication Sig Dispense Refill  . buPROPion (WELLBUTRIN XL) 150 MG 24 hr tablet Take 1 tablet (150 mg total) by mouth daily. 90 tablet 3  . butalbital-acetaminophen-caffeine (FIORICET, ESGIC) 50-325-40 MG tablet Take 1 tablet by mouth every 4 (four) hours as needed for headache. 90 tablet 0  . escitalopram (LEXAPRO)  10 MG tablet Take 1 tablet (10 mg total) by mouth daily. 90 tablet 1  . levothyroxine (SYNTHROID, LEVOTHROID) 100 MCG tablet TAKE 1 TABLET BY MOUTH ONCE DAILY 90 tablet 3   No current facility-administered medications on file prior to visit.     Allergies  Allergen Reactions  . Gabapentin     Made her feel funny    Family History  Problem Relation Age of Onset  . Alcohol abuse Maternal Uncle   . Pancreatic cancer Maternal Grandmother   . Stroke Maternal Grandfather   . Hypertension Mother   . Diabetes Mother   . Hyperlipidemia Mother   . Thyroid disease Neg Hx     BP 122/90 (BP Location: Left Arm, Patient Position: Sitting, Cuff Size: Normal)   Pulse 88   Wt 225 lb 3.2 oz (102.2 kg)   SpO2 99%   BMI 38.66 kg/m    Review of Systems Denies leg swelling    Objective:   Physical Exam VITAL SIGNS:  See vs page GENERAL: no distress NECK: There is no palpable thyroid enlargement.  No thyroid nodule is palpable.  No palpable lymphadenopathy at the anterior neck.       Assessment & Plan:  Hypothyroidism: due for recheck.    Patient Instructions  blood tests are requested for you today.  We'll  let you know about the results.   I would be happy to see you back here as needed.  You should have the blood test checked approx twice per year.

## 2017-08-06 NOTE — Assessment & Plan Note (Signed)
Takes fioricet as needed Migraines have been more often - may be related to vision - will have an eye exam soon Continue fioricet as needed - does not take often

## 2017-11-08 ENCOUNTER — Telehealth: Payer: 59 | Admitting: Nurse Practitioner

## 2017-11-08 DIAGNOSIS — N3000 Acute cystitis without hematuria: Secondary | ICD-10-CM

## 2017-11-08 MED ORDER — NITROFURANTOIN MONOHYD MACRO 100 MG PO CAPS: 100.0000 mg | ORAL_CAPSULE | Freq: Two times a day (BID) | ORAL | 0 refills | Status: DC

## 2017-11-08 NOTE — Progress Notes (Signed)

## 2017-12-31 ENCOUNTER — Ambulatory Visit: Payer: 59 | Admitting: Internal Medicine

## 2018-01-28 ENCOUNTER — Ambulatory Visit: Payer: 59 | Admitting: Internal Medicine

## 2018-01-29 DIAGNOSIS — Z0100 Encounter for examination of eyes and vision without abnormal findings: Secondary | ICD-10-CM | POA: Diagnosis not present

## 2018-02-01 ENCOUNTER — Telehealth: Payer: 59 | Admitting: Physician Assistant

## 2018-02-01 DIAGNOSIS — J069 Acute upper respiratory infection, unspecified: Secondary | ICD-10-CM | POA: Diagnosis not present

## 2018-02-01 MED ORDER — AZELASTINE HCL 0.1 % NA SOLN: 2.0000 | Freq: Two times a day (BID) | NASAL | 12 refills | Status: DC

## 2018-02-01 NOTE — Progress Notes (Signed)
We are sorry you are not feeling well.  Here is how we plan to help!  Based on what you have shared with me, it looks like you may have a viral upper respiratory infection or a "common cold".  Colds are caused by a large number of viruses; however, rhinovirus is the most common cause.   Symptoms of the common cold vary from person to person, with common symptoms including sore throat, cough, and malaise.  A low-grade fever of 100.4 may present, but is often uncommon.  Symptoms vary however, and are closely related to a person's age or underlying illnesses.  The most common symptoms associated with the common cold are nasal discharge or congestion, cough, sneezing, headache and pressure in the ears and face.  Cold symptoms usually persist for about 3 to 10 days, but can last up to 2 weeks.  It is important to know that colds do not cause serious illness or complications in most cases.    The common cold is transmitted from person to person, with the most common method of transmission being a person's hands.  The virus is able to live on the skin and can infect other persons for up to 2 hours after direct contact.  Also, colds are transmitted when someone coughs or sneezes; thus, it is important to cover the mouth to reduce this risk.  To keep the spread of the common cold at [place], good hand hygiene is very important.  This is an infection that is most likely caused by a virus. There are no specific treatments for the common cold other than to help you with the symptoms until the infection runs its course.    For nasal congestion, you may use an oral decongestants such as Mucinex D or if you have glaucoma or high blood pressure use plain Mucinex.  Saline nasal spray or nasal drops can help and can safely be used as often as needed for congestion.  For your congestion, I have prescribed Azelastine nasal spray two sprays in each nostril twice a day  If you do not have a history of heart disease, hypertension,  diabetes or thyroid disease, prostate/bladder issues or glaucoma, you may also use Sudafed to treat nasal congestion.  It is highly recommended that you consult with a pharmacist or your primary care physician to ensure this medication is safe for you to take.     If you have a cough, you may use cough suppressants such as Delsym and Robitussin.  If you have glaucoma or high blood pressure, you can also use Coricidin HBP.     If you have a sore or scratchy throat, use a saltwater gargle-  to  teaspoon of salt dissolved in a 4-ounce to 8-ounce glass of warm water.  Gargle the solution for approximately 15-30 seconds and then spit.  It is important not to swallow the solution.  You can also use throat lozenges/cough drops and Chloraseptic spray to help with throat pain or discomfort.  Warm or cold liquids can also be helpful in relieving throat pain.  For headache, pain or general discomfort, you can use Ibuprofen or Tylenol as directed.   Some authorities believe that zinc sprays or the use of Echinacea may shorten the course of your symptoms.   HOME CARE . Only take medications as instructed by your medical team. . Be sure to drink plenty of fluids. Water is fine as well as fruit juices, sodas and electrolyte beverages. You may want to stay  away from caffeine or alcohol. If you are nauseated, try taking small sips of liquids. How do you know if you are getting enough fluid? Your urine should be a pale yellow or almost colorless. . Get rest. . Taking a steamy shower or using a humidifier may help nasal congestion and ease sore throat pain. You can place a towel over your head and breathe in the steam from hot water coming from a faucet. . Using a saline nasal spray works much the same way. . Cough drops, hard candies and sore throat lozenges may ease your cough. . Avoid close contacts especially the very young and the elderly . Cover your mouth if you cough or sneeze . Always remember to wash  your hands.   GET HELP RIGHT AWAY IF: . You develop worsening fever. . If your symptoms do not improve within 10 days . You become short of breath. . You develop yellow or green discharge from your nose over 3 days. . You have coughing fits . You develop a severe head ache or visual changes. . You develop shortness of breath or difficulty breathing. . Your symptoms persist after you have completed your treatment plan  MAKE SURE YOU   Understand these instructions.  Will watch your condition.  Will get help right away if you are not doing well or get worse.  Your e-visit answers were reviewed by a board certified advanced clinical practitioner to complete your personal care plan. Depending upon the condition, your plan could have included both over the counter or prescription medications. Please review your pharmacy choice. If there is a problem, you may call our nursing hot line at and have the prescription routed to another pharmacy. Your safety is important to us. If you have drug allergies check your prescription carefully.   You can use MyChart to ask questions about today's visit, request a non-urgent call back, or ask for a work or school excuse for 24 hours related to this e-Visit. If it has been greater than 24 hours you will need to follow up with your provider, or enter a new e-Visit to address those concerns. You will get an e-mail in the next two days asking about your experience.  I hope that your e-visit has been valuable and will speed your recovery. Thank you for using e-visits.      

## 2018-02-18 ENCOUNTER — Ambulatory Visit: Payer: 59 | Admitting: Internal Medicine

## 2018-03-19 NOTE — Progress Notes (Signed)
Subjective:    Patient ID: Cheryl Hampton, female    DOB: 06-Dec-1985, 32 y.o.   MRN: [id number]  HPI The patient is here for follow up.  Hypothyroidism:  She is taking her medication daily.  She will no longer see Dr Loanne Drilling since she has been so stable and I will manage.  She does have decreased energy but thinks that is related to being on her menses for one month.  She is working with her gyn regarding this.   She also has some hair loss, which is not new.   Menorrhagia:  She is having her period for one month at a time, but not every month.  She has an implant that needs to be removed and gyn is working on her hormone levels now to help relieve the bleeding.  She feels tired when she has her menses.    Depression: She takes the lexparo and wellbutrin daily in the winter only, so she is not currently taking them.  . She denies any depression at this time.   Migraine headaches:  She takes fioricet as needed.  She has them more when she is on her period.  When she does not have her period she does not have them.   Medications and allergies reviewed with patient and updated if appropriate.  Patient Active Problem List   Diagnosis Date Noted  . Sciatica of right side 02/05/2017  . 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 Nonallopathic lesion of sacral region 07/24/2016  . Nonallopathic lesion of thoracic region 07/24/2016  . Nonallopathic lesion of lumbosacral region 07/24/2016  . Chronic midline low back pain without sciatica 06/12/2016  . Hair loss 06/12/2016  . Hypothyroidism 06/12/2016  . Obesity 01/09/2013  . Depression   . Migraine headache 02/06/2011    Current Outpatient Medications on File Prior to Visit  Medication Sig Dispense Refill  . butalbital-acetaminophen-caffeine (FIORICET, ESGIC) 50-325-40 MG tablet Take 1 tablet by mouth every 4 (four) hours as needed for headache. 90 tablet 0  . levothyroxine (SYNTHROID, LEVOTHROID) 100 MCG tablet TAKE 1 TABLET BY  MOUTH ONCE DAILY 90 tablet 3   No current facility-administered medications on file prior to visit.     Past Medical History:  Diagnosis Date  . Depression    Post partum took lexapro and wellbutrin and it helped  . Hypothyroid 2012 dx   post partum hyperthyroid  . Migraine headache     Past Surgical History:  Procedure Laterality Date  . MOUTH SURGERY    . Removal (R) Fallopian tube  09/2009    Social History   Socioeconomic History  . Marital status: Legally Separated    Spouse name: Not on file  . Number of children: Not on file  . Years of education: Not on file  . Highest education level: Not on file  Occupational History  . Not on file  Social Needs  . Financial resource strain: Not on file  . Food insecurity:    Worry: Not on file    Inability: Not on file  . Transportation needs:    Medical: Not on file    Non-medical: Not on file  Tobacco Use  . Smoking status: Never Smoker  . Smokeless tobacco: Never Used  Substance and Sexual Activity  . Alcohol use: No  . Drug use: No  . Sexual activity: Yes  Lifestyle  . Physical activity:    Days per week: Not on file    Minutes per session:  Not on file  . Stress: Not on file  Relationships  . Social connections:    Talks on phone: Not on file    Gets together: Not on file    Attends religious service: Not on file    Active member of club or organization: Not on file    Attends meetings of clubs or organizations: Not on file    Relationship status: Not on file  Other Topics Concern  . Not on file  Social History Narrative  . Not on file    Family History  Problem Relation Age of Onset  . Alcohol abuse Maternal Uncle   . Pancreatic cancer Maternal Grandmother   . Stroke Maternal Grandfather   . Hypertension Mother   . Diabetes Mother   . Hyperlipidemia Mother   . Thyroid disease Neg Hx     Review of Systems  Constitutional: Negative for chills and fever.  Respiratory: Negative for cough,  shortness of breath and wheezing.   Cardiovascular: Positive for leg swelling (when she has her menses). Negative for chest pain and palpitations.  Neurological: Positive for headaches (migraines). Negative for dizziness and light-headedness.  Psychiatric/Behavioral: Negative for dysphoric mood. The patient is not nervous/anxious.        Objective:   Vitals:   03/21/18 0824  BP: 136/84  Pulse: 75  Resp: 16  Temp: 98.7 F (37.1 C)  SpO2: 98%   BP Readings from Last 3 Encounters:  03/21/18 136/84  08/06/17 122/90  08/06/17 110/82   Wt Readings from Last 3 Encounters:  03/21/18 238 lb (108 kg)  08/06/17 225 lb 3.2 oz (102.2 kg)  08/06/17 227 lb (103 kg)   Body mass index is 40.85 kg/m.   Physical Exam    Constitutional: Appears well-developed and well-nourished. No distress.  HENT:  Head: Normocephalic and atraumatic.  Neck: Neck supple. No tracheal deviation present. No thyromegaly present.  No cervical lymphadenopathy Cardiovascular: Normal rate, regular rhythm and normal heart sounds.   No murmur heard. No carotid bruit .  No edema Pulmonary/Chest: Effort normal and breath sounds normal. No respiratory distress. No has no wheezes. No rales.  Skin: Skin is warm and dry. Not diaphoretic.  Psychiatric: Normal mood and affect. Behavior is normal.      Assessment & Plan:    See Problem List for Assessment and Plan of chronic medical problems.

## 2018-03-19 NOTE — Patient Instructions (Addendum)
  Tests ordered today. Your results will be released to [place] (or called to you) after review, usually within 72hours after test completion. If any changes need to be made, you will be notified at that same time.  No immunizations administered today.   Medications reviewed and updated.  Changes include :  none    Please followup in 6 months for a physical

## 2018-03-21 ENCOUNTER — Encounter: Payer: Self-pay | Admitting: Internal Medicine

## 2018-03-21 ENCOUNTER — Other Ambulatory Visit (INDEPENDENT_AMBULATORY_CARE_PROVIDER_SITE_OTHER): Payer: 59

## 2018-03-21 ENCOUNTER — Ambulatory Visit (INDEPENDENT_AMBULATORY_CARE_PROVIDER_SITE_OTHER): Payer: 59 | Admitting: Internal Medicine

## 2018-03-21 VITALS — BP 136/84 | HR 75 | Temp 98.7°F | Resp 16 | Ht 64.0 in | Wt 238.0 lb

## 2018-03-21 DIAGNOSIS — E039 Hypothyroidism, unspecified: Secondary | ICD-10-CM

## 2018-03-21 DIAGNOSIS — N924 Excessive bleeding in the premenopausal period: Secondary | ICD-10-CM

## 2018-03-21 DIAGNOSIS — F32A Depression, unspecified: Secondary | ICD-10-CM

## 2018-03-21 DIAGNOSIS — G43809 Other migraine, not intractable, without status migrainosus: Secondary | ICD-10-CM | POA: Diagnosis not present

## 2018-03-21 DIAGNOSIS — N920 Excessive and frequent menstruation with regular cycle: Secondary | ICD-10-CM | POA: Insufficient documentation

## 2018-03-21 DIAGNOSIS — F329 Major depressive disorder, single episode, unspecified: Secondary | ICD-10-CM

## 2018-03-21 LAB — CBC WITH DIFFERENTIAL/PLATELET
BASOS PCT: 0.6 % (ref 0.0–3.0)
Basophils Absolute: 0 10*3/uL (ref 0.0–0.1)
EOS PCT: 1.6 % (ref 0.0–5.0)
Eosinophils Absolute: 0.1 10*3/uL (ref 0.0–0.7)
HEMATOCRIT: 39.4 % (ref 36.0–46.0)
HEMOGLOBIN: 13.3 g/dL (ref 12.0–15.0)
Lymphocytes Relative: 30.7 % (ref 12.0–46.0)
Lymphs Abs: 2.1 10*3/uL (ref 0.7–4.0)
MCHC: 33.7 g/dL (ref 30.0–36.0)
MCV: 88.2 fl (ref 78.0–100.0)
Monocytes Absolute: 0.4 10*3/uL (ref 0.1–1.0)
Monocytes Relative: 6.2 % (ref 3.0–12.0)
NEUTROS ABS: 4.1 10*3/uL (ref 1.4–7.7)
Neutrophils Relative %: 60.9 % (ref 43.0–77.0)
Platelets: 345 10*3/uL (ref 150.0–400.0)
RBC: 4.47 Mil/uL (ref 3.87–5.11)
RDW: 13.8 % (ref 11.5–15.5)
WBC: 6.7 10*3/uL (ref 4.0–10.5)

## 2018-03-21 LAB — COMPREHENSIVE METABOLIC PANEL
ALT: 15 U/L (ref 0–35)
AST: 13 U/L (ref 0–37)
Albumin: 4 g/dL (ref 3.5–5.2)
Alkaline Phosphatase: 44 U/L (ref 39–117)
BUN: 13 mg/dL (ref 6–23)
CALCIUM: 9.4 mg/dL (ref 8.4–10.5)
CO2: 27 mEq/L (ref 19–32)
Chloride: 102 mEq/L (ref 96–112)
Creatinine, Ser: 0.56 mg/dL (ref 0.40–1.20)
GFR: 134.26 mL/min (ref >60.00)
Glucose, Bld: 88 mg/dL (ref 70–99)
POTASSIUM: 3.9 meq/L (ref 3.5–5.1)
Sodium: 137 mEq/L (ref 135–145)
Total Bilirubin: 0.4 mg/dL (ref 0.2–1.2)
Total Protein: 7.3 g/dL (ref 6.0–8.3)

## 2018-03-21 LAB — FERRITIN: Ferritin: 21.6 ng/mL (ref 10.0–291.0)

## 2018-03-21 LAB — IRON: Iron: 80 ug/dL (ref 42–145)

## 2018-03-21 LAB — T4, FREE: FREE T4: 0.78 ng/dL (ref 0.60–1.60)

## 2018-03-21 LAB — TSH: TSH: 3.23 u[IU]/mL (ref 0.35–4.50)

## 2018-03-21 NOTE — Assessment & Plan Note (Signed)
Not currently depressed - seasonal depression  Takes lexapro and wellbutrin in winter only - she will likely start them at some point in the fall - she knows when she needs to start them.

## 2018-03-21 NOTE — Assessment & Plan Note (Signed)
Hormonal - tendss to only have migraines with her menses Continue Fioricet as needed

## 2018-03-21 NOTE — Assessment & Plan Note (Signed)
Having increased fatigue, but likely related to heavy menses Clinically euthyroid Check tsh, ft4  Titrate med dose if needed

## 2018-03-21 NOTE — Assessment & Plan Note (Signed)
Working with gyn now -- implant needs to be removed soon Having periods not every month, but when she gets them she may bleed for one month Has increased fatigue with menses  - no SOB, lightheadedness Check cbc, iron, ferritin

## 2018-03-23 ENCOUNTER — Encounter: Payer: Self-pay | Admitting: Internal Medicine

## 2018-03-31 ENCOUNTER — Encounter: Payer: Self-pay | Admitting: Internal Medicine

## 2018-04-01 MED ORDER — BUPROPION HCL ER (XL) 150 MG PO TB24: 150.0000 mg | ORAL_TABLET | Freq: Every day | ORAL | 5 refills | Status: DC

## 2018-04-01 MED ORDER — ESCITALOPRAM OXALATE 10 MG PO TABS: 10.0000 mg | ORAL_TABLET | Freq: Every day | ORAL | 5 refills | Status: DC

## 2018-05-10 ENCOUNTER — Other Ambulatory Visit: Payer: Self-pay | Admitting: Endocrinology

## 2018-07-08 DIAGNOSIS — Z6841 Body Mass Index (BMI) 40.0 and over, adult: Secondary | ICD-10-CM | POA: Diagnosis not present

## 2018-07-08 DIAGNOSIS — Z3009 Encounter for other general counseling and advice on contraception: Secondary | ICD-10-CM | POA: Diagnosis not present

## 2018-07-08 DIAGNOSIS — Z1389 Encounter for screening for other disorder: Secondary | ICD-10-CM | POA: Diagnosis not present

## 2018-07-08 DIAGNOSIS — Z124 Encounter for screening for malignant neoplasm of cervix: Secondary | ICD-10-CM | POA: Diagnosis not present

## 2018-07-08 DIAGNOSIS — Z130 Encounter for screening for diseases of the blood and blood-forming organs and certain disorders involving the immune mechanism: Secondary | ICD-10-CM | POA: Diagnosis not present

## 2018-07-08 DIAGNOSIS — Z3046 Encounter for surveillance of implantable subdermal contraceptive: Secondary | ICD-10-CM | POA: Diagnosis not present

## 2018-07-08 DIAGNOSIS — Z01419 Encounter for gynecological examination (general) (routine) without abnormal findings: Secondary | ICD-10-CM | POA: Diagnosis not present

## 2018-07-14 ENCOUNTER — Encounter: Payer: Self-pay | Admitting: Internal Medicine

## 2018-07-14 ENCOUNTER — Other Ambulatory Visit: Payer: Self-pay | Admitting: Internal Medicine

## 2018-07-15 MED ORDER — BUTALBITAL-APAP-CAFFEINE 50-325-40 MG PO TABS: 1.0000 | ORAL_TABLET | ORAL | 0 refills | Status: DC | PRN

## 2018-07-15 NOTE — Telephone Encounter (Signed)
This medication has not been filled since 2018. Please advise.

## 2018-07-26 DIAGNOSIS — Z3046 Encounter for surveillance of implantable subdermal contraceptive: Secondary | ICD-10-CM | POA: Diagnosis not present

## 2018-07-26 DIAGNOSIS — Z30011 Encounter for initial prescription of contraceptive pills: Secondary | ICD-10-CM | POA: Diagnosis not present

## 2018-08-19 ENCOUNTER — Encounter: Payer: 59 | Admitting: Internal Medicine

## 2018-08-26 ENCOUNTER — Encounter: Payer: Self-pay | Admitting: Internal Medicine

## 2018-08-26 MED ORDER — BUPROPION HCL ER (XL) 300 MG PO TB24: 300.0000 mg | ORAL_TABLET | Freq: Every day | ORAL | 1 refills | Status: DC

## 2018-08-26 MED ORDER — ESCITALOPRAM OXALATE 20 MG PO TABS: 20.0000 mg | ORAL_TABLET | Freq: Every day | ORAL | 1 refills | Status: DC

## 2018-09-28 ENCOUNTER — Encounter: Payer: Self-pay | Admitting: Emergency Medicine

## 2018-09-29 ENCOUNTER — Encounter: Payer: Self-pay | Admitting: Internal Medicine

## 2018-09-29 DIAGNOSIS — E039 Hypothyroidism, unspecified: Secondary | ICD-10-CM

## 2018-09-29 MED ORDER — PHENTERMINE HCL 30 MG PO CAPS: 30.0000 mg | ORAL_CAPSULE | ORAL | 0 refills | Status: DC

## 2018-09-29 NOTE — Addendum Note (Signed)
Addended by: Binnie Rail on: 09/29/2018 12:40 PM   Modules accepted: Orders

## 2018-10-07 ENCOUNTER — Encounter: Payer: 59 | Admitting: Internal Medicine

## 2018-10-26 ENCOUNTER — Other Ambulatory Visit: Payer: Self-pay | Admitting: Internal Medicine

## 2018-10-26 MED ORDER — PHENTERMINE HCL 37.5 MG PO CAPS: 37.5000 mg | ORAL_CAPSULE | ORAL | 0 refills | Status: DC

## 2018-11-23 ENCOUNTER — Other Ambulatory Visit: Payer: Self-pay | Admitting: Internal Medicine

## 2018-11-23 MED ORDER — PHENTERMINE HCL 37.5 MG PO CAPS: 37.5000 mg | ORAL_CAPSULE | ORAL | 0 refills | Status: DC

## 2018-11-23 NOTE — Telephone Encounter (Signed)
Has a physical with you on 12/16/18 Last OV was 03/21/18

## 2018-12-16 ENCOUNTER — Encounter: Payer: 59 | Admitting: Internal Medicine

## 2018-12-19 ENCOUNTER — Other Ambulatory Visit: Payer: Self-pay | Admitting: Internal Medicine

## 2018-12-19 NOTE — Telephone Encounter (Signed)
Has CPE on 01/13/19

## 2019-01-12 NOTE — Patient Instructions (Addendum)
Tests ordered today. Your results will be released to [place] (or called to you) after review.  If any changes need to be made, you will be notified at that same time.  All other Health Maintenance issues reviewed.   All recommended immunizations and age-appropriate screenings are up-to-date or discussed.  No immunization administered today.   Medications reviewed and updated.  Changes include :   Starting effexor  Your prescription(s) have been submitted to your pharmacy. Please take as directed and contact our office if you believe you are having problem(s) with the medication(s).   Please followup in 6 months   Health Maintenance, Female Adopting a healthy lifestyle and getting preventive care are important in promoting health and wellness. Ask your health care provider about:  The right schedule for you to have regular tests and exams.  Things you can do on your own to prevent diseases and keep yourself healthy. What should I know about diet, weight, and exercise? Eat a healthy diet   Eat a diet that includes plenty of vegetables, fruits, low-fat dairy products, and lean protein.  Do not eat a lot of foods that are high in solid fats, added sugars, or sodium. Maintain a healthy weight Body mass index (BMI) is used to identify weight problems. It estimates body fat based on height and weight. Your health care provider can help determine your BMI and help you achieve or maintain a healthy weight. Get regular exercise Get regular exercise. This is one of the most important things you can do for your health. Most adults should:  Exercise for at least 150 minutes each week. The exercise should increase your heart rate and make you sweat (moderate-intensity exercise).  Do strengthening exercises at least twice a week. This is in addition to the moderate-intensity exercise.  Spend less time sitting. Even light physical activity can be beneficial. Watch cholesterol and blood lipids  Have your blood tested for lipids and cholesterol at 32 years of age, then have this test every 5 years. Have your cholesterol levels checked more often if:  Your lipid or cholesterol levels are high.  You are older than 32 years of age.  You are at high risk for heart disease. What should I know about cancer screening? Depending on your health history and family history, you may need to have cancer screening at various ages. This may include screening for:  Breast cancer.  Cervical cancer.  Colorectal cancer.  Skin cancer.  Lung cancer. What should I know about heart disease, diabetes, and high blood pressure? Blood pressure and heart disease  High blood pressure causes heart disease and increases the risk of stroke. This is more likely to develop in people who have high blood pressure readings, are of African descent, or are overweight.  Have your blood pressure checked: ? Every 3-5 years if you are 81-63 years of age. ? Every year if you are 39 years old or older. Diabetes Have regular diabetes screenings. This checks your fasting blood sugar level. Have the screening done:  Once every three years after age 24 if you are at a normal weight and have a low risk for diabetes.  More often and at a younger age if you are overweight or have a high risk for diabetes. What should I know about preventing infection? Hepatitis B If you have a higher risk for hepatitis B, you should be screened for this virus. Talk with your health care provider to find out if you are at risk  for hepatitis B infection. Hepatitis C Testing is recommended for:  Everyone born from [birth year] through 1964.  Anyone with known risk factors for hepatitis C. Sexually transmitted infections (STIs)  Get screened for STIs, including gonorrhea and chlamydia, if: ? You are sexually active and are younger than 32 years of age. ? You are older than 32 years of age and your health care provider tells you that you  are at risk for this type of infection. ? Your sexual activity has changed since you were last screened, and you are at increased risk for chlamydia or gonorrhea. Ask your health care provider if you are at risk.  Ask your health care provider about whether you are at high risk for HIV. Your health care provider may recommend a prescription medicine to help prevent HIV infection. If you choose to take medicine to prevent HIV, you should first get tested for HIV. You should then be tested every 3 months for as long as you are taking the medicine. Pregnancy  If you are about to stop having your period (premenopausal) and you may become pregnant, seek counseling before you get pregnant.  Take 400 to 800 micrograms (mcg) of folic acid every day if you become pregnant.  Ask for birth control (contraception) if you want to prevent pregnancy. Osteoporosis and menopause Osteoporosis is a disease in which the bones lose minerals and strength with aging. This can result in bone fractures. If you are 84 years old or older, or if you are at risk for osteoporosis and fractures, ask your health care provider if you should:  Be screened for bone loss.  Take a calcium or vitamin D supplement to lower your risk of fractures.  Be given hormone replacement therapy (HRT) to treat symptoms of menopause. Follow these instructions at home: Lifestyle  Do not use any products that contain nicotine or tobacco, such as cigarettes, e-cigarettes, and chewing tobacco. If you need help quitting, ask your health care provider.  Do not use street drugs.  Do not share needles.  Ask your health care provider for help if you need support or information about quitting drugs. Alcohol use  Do not drink alcohol if: ? Your health care provider tells you not to drink. ? You are pregnant, may be pregnant, or are planning to become pregnant.  If you drink alcohol: ? Limit how much you use to 0-1 drink a day. ? Limit intake  if you are breastfeeding.  Be aware of how much alcohol is in your drink. In the [place] one drink equals one 12 oz bottle of beer (355 mL), one 5 oz glass of wine (148 mL), or one 1 oz glass of hard liquor (44 mL). General instructions  Schedule regular health, dental, and eye exams.  Stay current with your vaccines.  Tell your health care provider if: ? You often feel depressed. ? You have ever been abused or do not feel safe at home. Summary  Adopting a healthy lifestyle and getting preventive care are important in promoting health and wellness.  Follow your health care provider's instructions about healthy diet, exercising, and getting tested or screened for diseases.  Follow your health care provider's instructions on monitoring your cholesterol and blood pressure. This information is not intended to replace advice given to you by your health care provider. Make sure you discuss any questions you have with your health care provider. Document Released: 12/22/2010 Document Revised: 06/01/2018 Document Reviewed: 06/01/2018 Elsevier Patient Education  2020 [employer].

## 2019-01-12 NOTE — Progress Notes (Signed)
Subjective:    Patient ID: Cheryl Hampton, female    DOB: 06-Feb-1986, 33 y.o.   MRN: [id number]  HPI She is here for a physical exam.   She stopped the wellbutrin and lexapro because she felt depressed, anxious and irritable on them - they did not seem to be working.  She stopped them about two weeks ago.  She still feels the same.    Medications and allergies reviewed with patient and updated if appropriate.  Patient Active Problem List   Diagnosis Date Noted  . Menorrhagia 03/21/2018  . Sciatica of right side 02/05/2017  . 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 Nonallopathic lesion of sacral region 07/24/2016  . Nonallopathic lesion of thoracic region 07/24/2016  . Nonallopathic lesion of lumbosacral region 07/24/2016  . Chronic midline low back pain without sciatica 06/12/2016  . Hair loss 06/12/2016  . Hypothyroidism 06/12/2016  . Obesity 01/09/2013  . Depression   . Migraine headache 02/06/2011    Current Outpatient Medications on File Prior to Visit  Medication Sig Dispense Refill  . butalbital-acetaminophen-caffeine (FIORICET, ESGIC) 50-325-40 MG tablet Take 1 tablet by mouth every 4 (four) hours as needed for headache. 90 tablet 0  . levothyroxine (SYNTHROID, LEVOTHROID) 100 MCG tablet TAKE 1 TABLET BY MOUTH ONCE DAILY 90 tablet 3  . phentermine 37.5 MG capsule TAKE 1 CAPSULE (37.5 MG TOTAL) BY MOUTH EVERY MORNING. 30 capsule 0   No current facility-administered medications on file prior to visit.     Past Medical History:  Diagnosis Date  . Depression    Post partum took lexapro and wellbutrin and it helped  . Hypothyroid 2012 dx   post partum hyperthyroid  . Migraine headache     Past Surgical History:  Procedure Laterality Date  . MOUTH SURGERY    . Removal (R) Fallopian tube  09/2009    Social History   Socioeconomic History  . Marital status: Divorced    Spouse name: Not on file  . Number of children: Not on file  . Years of education:  Not on file  . Highest education level: Not on file  Occupational History  . Not on file  Social Needs  . Financial resource strain: Not on file  . Food insecurity    Worry: Not on file    Inability: Not on file  . Transportation needs    Medical: Not on file    Non-medical: Not on file  Tobacco Use  . Smoking status: Never Smoker  . Smokeless tobacco: Never Used  Substance and Sexual Activity  . Alcohol use: No  . Drug use: No  . Sexual activity: Yes  Lifestyle  . Physical activity    Days per week: Not on file    Minutes per session: Not on file  . Stress: Not on file  Relationships  . Social Herbalist on phone: Not on file    Gets together: Not on file    Attends religious service: Not on file    Active member of club or organization: Not on file    Attends meetings of clubs or organizations: Not on file    Relationship status: Not on file  Other Topics Concern  . Not on file  Social History Narrative  . Not on file    Family History  Problem Relation Age of Onset  . Alcohol abuse Maternal Uncle   . Pancreatic cancer Maternal Grandmother   . Stroke Maternal Grandfather   .  Hypertension Mother   . Diabetes Mother   . Hyperlipidemia Mother   . Thyroid disease Neg Hx     Review of Systems  Constitutional: Negative for chills and fever.  Eyes: Negative for visual disturbance.  Respiratory: Negative for cough, shortness of breath and wheezing.   Cardiovascular: Negative for chest pain, palpitations and leg swelling.  Gastrointestinal: Positive for anal bleeding (hemorrhoidal). Negative for abdominal pain, blood in stool, constipation, diarrhea and nausea.       Occ gerd  Genitourinary: Negative for dysuria and hematuria.  Musculoskeletal: Positive for back pain. Negative for arthralgias.  Skin: Negative for color change and rash.  Neurological: Positive for headaches. Negative for light-headedness.  Psychiatric/Behavioral: Positive for dysphoric  mood. The patient is nervous/anxious.        Objective:   Vitals:   01/13/19 1327  BP: 134/72  Pulse: 95  Resp: 16  Temp: 98.1 F (36.7 C)  SpO2: 98%   Filed Weights   01/13/19 1327  Weight: 223 lb 12.8 oz (101.5 kg)   Body mass index is 38.42 kg/m.  BP Readings from Last 3 Encounters:  01/13/19 134/72  03/21/18 136/84  08/06/17 122/90    Wt Readings from Last 3 Encounters:  01/13/19 223 lb 12.8 oz (101.5 kg)  03/21/18 238 lb (108 kg)  08/06/17 225 lb 3.2 oz (102.2 kg)     Physical Exam Constitutional: She appears well-developed and well-nourished. No distress.  HENT:  Head: Normocephalic and atraumatic.  Right Ear: External ear normal. Normal ear canal and TM Left Ear: External ear normal.  Normal ear canal and TM Mouth/Throat: Oropharynx is clear and moist.  Eyes: Conjunctivae and EOM are normal.  Neck: Neck supple. No tracheal deviation present. No thyromegaly present.  No carotid bruit  Cardiovascular: Normal rate, regular rhythm and normal heart sounds.   No murmur heard.  No edema. Pulmonary/Chest: Effort normal and breath sounds normal. No respiratory distress. She has no wheezes. She has no rales.  Breast: deferred   Abdominal: Soft. She exhibits no distension. There is no tenderness.  Lymphadenopathy: She has no cervical adenopathy.  Skin: Skin is warm and dry. She is not diaphoretic.  Psychiatric: She has a normal mood and affect. Her behavior is normal.        Assessment & Plan:   Physical exam: Screening blood work ordered Immunizations    Up to date  Gyn    Up to date  Exercise  regular Weight  Working on weight loss Skin  No concerns - sees derm in sept Substance abuse    none  See Problem List for Assessment and Plan of chronic medical problems.    FU in 6 months

## 2019-01-13 ENCOUNTER — Other Ambulatory Visit: Payer: Self-pay

## 2019-01-13 ENCOUNTER — Ambulatory Visit (INDEPENDENT_AMBULATORY_CARE_PROVIDER_SITE_OTHER): Payer: 59 | Admitting: Internal Medicine

## 2019-01-13 ENCOUNTER — Other Ambulatory Visit (INDEPENDENT_AMBULATORY_CARE_PROVIDER_SITE_OTHER): Payer: 59

## 2019-01-13 ENCOUNTER — Encounter: Payer: Self-pay | Admitting: Internal Medicine

## 2019-01-13 VITALS — BP 134/72 | HR 95 | Temp 98.1°F | Resp 16 | Ht 64.0 in | Wt 223.8 lb

## 2019-01-13 DIAGNOSIS — E039 Hypothyroidism, unspecified: Secondary | ICD-10-CM

## 2019-01-13 DIAGNOSIS — Z6841 Body Mass Index (BMI) 40.0 and over, adult: Secondary | ICD-10-CM

## 2019-01-13 DIAGNOSIS — Z0000 Encounter for general adult medical examination without abnormal findings: Secondary | ICD-10-CM

## 2019-01-13 DIAGNOSIS — F329 Major depressive disorder, single episode, unspecified: Secondary | ICD-10-CM | POA: Diagnosis not present

## 2019-01-13 DIAGNOSIS — F419 Anxiety disorder, unspecified: Secondary | ICD-10-CM | POA: Diagnosis not present

## 2019-01-13 DIAGNOSIS — G43809 Other migraine, not intractable, without status migrainosus: Secondary | ICD-10-CM

## 2019-01-13 DIAGNOSIS — F32A Depression, unspecified: Secondary | ICD-10-CM

## 2019-01-13 LAB — COMPREHENSIVE METABOLIC PANEL
ALT: 32 U/L (ref 0–35)
AST: 20 U/L (ref 0–37)
Albumin: 4 g/dL (ref 3.5–5.2)
Alkaline Phosphatase: 54 U/L (ref 39–117)
BUN: 8 mg/dL (ref 6–23)
CO2: 28 mEq/L (ref 19–32)
Calcium: 9.2 mg/dL (ref 8.4–10.5)
Chloride: 102 mEq/L (ref 96–112)
Creatinine, Ser: 0.57 mg/dL (ref 0.40–1.20)
GFR: 123.12 mL/min (ref >60.00)
Glucose, Bld: 71 mg/dL (ref 70–99)
Potassium: 3.8 mEq/L (ref 3.5–5.1)
Sodium: 138 mEq/L (ref 135–145)
Total Bilirubin: 0.4 mg/dL (ref 0.2–1.2)
Total Protein: 7 g/dL (ref 6.0–8.3)

## 2019-01-13 LAB — CBC WITH DIFFERENTIAL/PLATELET
Basophils Absolute: 0 10*3/uL (ref 0.0–0.1)
Basophils Relative: 0.4 % (ref 0.0–3.0)
Eosinophils Absolute: 0.1 10*3/uL (ref 0.0–0.7)
Eosinophils Relative: 1.2 % (ref 0.0–5.0)
HCT: 38 % (ref 36.0–46.0)
Hemoglobin: 12.5 g/dL (ref 12.0–15.0)
Lymphocytes Relative: 27.3 % (ref 12.0–46.0)
Lymphs Abs: 2.3 10*3/uL (ref 0.7–4.0)
MCHC: 32.8 g/dL (ref 30.0–36.0)
MCV: 92 fl (ref 78.0–100.0)
Monocytes Absolute: 0.5 10*3/uL (ref 0.1–1.0)
Monocytes Relative: 5.6 % (ref 3.0–12.0)
Neutro Abs: 5.6 10*3/uL (ref 1.4–7.7)
Neutrophils Relative %: 65.5 % (ref 43.0–77.0)
Platelets: 362 10*3/uL (ref 150.0–400.0)
RBC: 4.14 Mil/uL (ref 3.87–5.11)
RDW: 13.3 % (ref 11.5–15.5)
WBC: 8.5 10*3/uL (ref 4.0–10.5)

## 2019-01-13 LAB — LIPID PANEL
Cholesterol: 184 mg/dL (ref 0–200)
HDL: 40.6 mg/dL (ref >39.00)
NonHDL: 143.45
Total CHOL/HDL Ratio: 5
Triglycerides: 236 mg/dL — ABNORMAL HIGH (ref 0.0–149.0)
VLDL: 47.2 mg/dL — ABNORMAL HIGH (ref 0.0–40.0)

## 2019-01-13 LAB — TSH: TSH: 0.56 u[IU]/mL (ref 0.35–4.50)

## 2019-01-13 LAB — LDL CHOLESTEROL, DIRECT: Direct LDL: 117 mg/dL

## 2019-01-13 LAB — T4, FREE: Free T4: 1.19 ng/dL (ref 0.60–1.60)

## 2019-01-13 MED ORDER — VENLAFAXINE HCL ER 37.5 MG PO CP24: 37.5000 mg | ORAL_CAPSULE | Freq: Every day | ORAL | 5 refills | Status: DC

## 2019-01-13 NOTE — Assessment & Plan Note (Addendum)
Having depression now - typically only needs medication in the winter, but increased depression/anxiety may be related to COVID Lexapro/wellbutrin no longer effective  - stopped taking them Will try effexor 37.5 mg dialy

## 2019-01-13 NOTE — Assessment & Plan Note (Signed)
Having anxiety Lexapro/wellbutrin no longer effective  - stopped taking them Will try effexor 37.5 mg dialy

## 2019-01-13 NOTE — Assessment & Plan Note (Signed)
Exercising Eating better Has lost weight Taking phentermine - will continue - tolerating it well - advised she can only take this for a few months

## 2019-01-13 NOTE — Assessment & Plan Note (Signed)
Clinically euthyroid Check tsh, ft4  Titrate med dose if needed

## 2019-01-13 NOTE — Assessment & Plan Note (Signed)
fioricet prn

## 2019-01-15 ENCOUNTER — Encounter: Payer: Self-pay | Admitting: Internal Medicine

## 2019-01-20 ENCOUNTER — Other Ambulatory Visit: Payer: Self-pay | Admitting: Internal Medicine

## 2019-01-20 MED ORDER — PHENTERMINE HCL 37.5 MG PO CAPS: 37.5000 mg | ORAL_CAPSULE | ORAL | 0 refills | Status: DC

## 2019-02-13 ENCOUNTER — Encounter: Payer: Self-pay | Admitting: Internal Medicine

## 2019-02-13 ENCOUNTER — Other Ambulatory Visit: Payer: Self-pay | Admitting: Internal Medicine

## 2019-02-13 MED ORDER — PHENTERMINE HCL 37.5 MG PO CAPS: 37.5000 mg | ORAL_CAPSULE | ORAL | 0 refills | Status: DC

## 2019-02-14 ENCOUNTER — Encounter: Payer: Self-pay | Admitting: Internal Medicine

## 2019-02-14 MED ORDER — VENLAFAXINE HCL ER 37.5 MG PO CP24: 37.5000 mg | ORAL_CAPSULE | Freq: Every day | ORAL | 1 refills | Status: DC

## 2019-02-17 ENCOUNTER — Encounter: Payer: Self-pay | Admitting: Internal Medicine

## 2019-03-03 DIAGNOSIS — D171 Benign lipomatous neoplasm of skin and subcutaneous tissue of trunk: Secondary | ICD-10-CM | POA: Diagnosis not present

## 2019-03-03 DIAGNOSIS — B353 Tinea pedis: Secondary | ICD-10-CM | POA: Diagnosis not present

## 2019-03-03 DIAGNOSIS — D225 Melanocytic nevi of trunk: Secondary | ICD-10-CM | POA: Diagnosis not present

## 2019-03-03 DIAGNOSIS — I781 Nevus, non-neoplastic: Secondary | ICD-10-CM | POA: Diagnosis not present

## 2019-03-20 ENCOUNTER — Other Ambulatory Visit: Payer: Self-pay | Admitting: Internal Medicine

## 2019-03-21 MED ORDER — PHENTERMINE HCL 37.5 MG PO CAPS: 37.5000 mg | ORAL_CAPSULE | ORAL | 0 refills | Status: DC

## 2019-03-21 NOTE — Telephone Encounter (Signed)
[place] Controlled Database Checked Last filled: 02/15/19 # 30 LOV w/you: 01/13/19 Next appt w/you: 07/14/19

## 2019-04-06 ENCOUNTER — Encounter: Payer: Self-pay | Admitting: Internal Medicine

## 2019-04-06 MED ORDER — VENLAFAXINE HCL ER 75 MG PO CP24: 75.0000 mg | ORAL_CAPSULE | Freq: Every day | ORAL | Status: DC

## 2019-04-24 ENCOUNTER — Encounter: Payer: Self-pay | Admitting: Internal Medicine

## 2019-04-24 ENCOUNTER — Ambulatory Visit (INDEPENDENT_AMBULATORY_CARE_PROVIDER_SITE_OTHER): Payer: 59 | Admitting: Internal Medicine

## 2019-04-24 DIAGNOSIS — F329 Major depressive disorder, single episode, unspecified: Secondary | ICD-10-CM | POA: Diagnosis not present

## 2019-04-24 DIAGNOSIS — E039 Hypothyroidism, unspecified: Secondary | ICD-10-CM

## 2019-04-24 DIAGNOSIS — I10 Essential (primary) hypertension: Secondary | ICD-10-CM | POA: Diagnosis not present

## 2019-04-24 DIAGNOSIS — F419 Anxiety disorder, unspecified: Secondary | ICD-10-CM

## 2019-04-24 DIAGNOSIS — F32A Depression, unspecified: Secondary | ICD-10-CM

## 2019-04-24 MED ORDER — LISINOPRIL-HYDROCHLOROTHIAZIDE 10-12.5 MG PO TABS: 1.0000 | ORAL_TABLET | Freq: Every day | ORAL | 0 refills | Status: DC

## 2019-04-24 NOTE — Assessment & Plan Note (Signed)
Venlafaxine increased recently-she has not seen improvement in anxiety/depression Discussed options of trying something different or giving this more time Also discussed that this could be increasing her blood pressure She would like to give it a couple of more weeks and then we can discuss if we need to do something different

## 2019-04-24 NOTE — Assessment & Plan Note (Signed)
New She does have a strong family history of hypertension so this could be partially genetic She is overweight,?  Eating a low-sodium diet and not currently exercising Discussed lifestyle changes We had increased her Effexor and this potentially could be elevating her blood pressure as well If her thyroid is off that could be influencing her blood pressure No change in birth control so unlikely the cause Will start lisinopril-hydrochlorothiazide 10-12.5 mg daily Check BMP next week, TSH, free T4

## 2019-04-24 NOTE — Progress Notes (Signed)
Virtual Visit via Video Note  I connected with Cheryl Hampton on 04/24/19 at  1:45 PM EST by a video enabled telemedicine application and verified that I am speaking with the correct person using two identifiers.   I discussed the limitations of evaluation and management by telemedicine and the availability of in person appointments. The patient expressed understanding and agreed to proceed.  The patient is currently at home and I am in the office.    No referring provider.    History of Present Illness: This is an acute visit for her BP.   Yesterday she woke up feeling lightheaded and dizzy.  She checked her blood pressure yesterday and this morning it has been persistently elevated.  Overall it has been 160-170/90-100.  She has not had any chest pain, numbness/tingling, palpitations.  She does have a strong family history of hypertension.  She has never been diagnosed with high blood pressure and has never been on medication.  We had increased her venlafaxine at some point.  She has not seen much difference in her anxiety or depression, but has not been a full month.  She wonders if that could have increased her blood pressure.  Hypothyroidism: She is taking her medication daily.  She has noticed some increase in hair loss and some weight gain and she is unsure if this is the thyroid or not.  She is not exercising regularly.  She thinks she does okay with watching salt intake, but does not pay much attention to it.   Social History   Socioeconomic History  . Marital status: Married    Spouse name: Not on file  . Number of children: Not on file  . Years of education: Not on file  . Highest education level: Not on file  Occupational History  . Not on file  Social Needs  . Financial resource strain: Not on file  . Food insecurity    Worry: Not on file    Inability: Not on file  . Transportation needs    Medical: Not on file    Non-medical: Not on file  Tobacco Use  . Smoking  status: Never Smoker  . Smokeless tobacco: Never Used  Substance and Sexual Activity  . Alcohol use: No  . Drug use: No  . Sexual activity: Yes  Lifestyle  . Physical activity    Days per week: Not on file    Minutes per session: Not on file  . Stress: Not on file  Relationships  . Social Herbalist on phone: Not on file    Gets together: Not on file    Attends religious service: Not on file    Active member of club or organization: Not on file    Attends meetings of clubs or organizations: Not on file    Relationship status: Not on file  Other Topics Concern  . Not on file  Social History Narrative  . Not on file     Observations/Objective: Appears well in NAD Breathing normally Skin appears warm and dry  Assessment and Plan:  See Problem List for Assessment and Plan of chronic medical problems.   Follow Up Instructions:    I discussed the assessment and treatment plan with the patient. The patient was provided an opportunity to ask questions and all were answered. The patient agreed with the plan and demonstrated an understanding of the instructions.   The patient was advised to call back or seek an in-person evaluation if  the symptoms worsen or if the condition fails to improve as anticipated.    Binnie Rail, MD

## 2019-04-24 NOTE — Assessment & Plan Note (Signed)
Has had increased hair loss and weight gain Her thyroid could influence her blood pressure Check TSH, free T4 We will adjust medication if needed

## 2019-04-28 DIAGNOSIS — H5213 Myopia, bilateral: Secondary | ICD-10-CM | POA: Diagnosis not present

## 2019-05-03 ENCOUNTER — Encounter: Payer: Self-pay | Admitting: Internal Medicine

## 2019-05-03 MED ORDER — VENLAFAXINE HCL ER 150 MG PO CP24: 150.0000 mg | ORAL_CAPSULE | Freq: Every day | ORAL | 1 refills | Status: DC

## 2019-05-15 ENCOUNTER — Other Ambulatory Visit: Payer: Self-pay

## 2019-05-15 ENCOUNTER — Telehealth: Payer: Self-pay

## 2019-05-15 ENCOUNTER — Telehealth: Payer: Self-pay | Admitting: Endocrinology

## 2019-05-15 NOTE — Telephone Encounter (Signed)
1.  Please schedule f/u appt 2.  Then please refill x 1, pending that appt.  

## 2019-05-15 NOTE — Telephone Encounter (Signed)
Yes, please.

## 2019-05-15 NOTE — Telephone Encounter (Signed)
LOV 08/06/17. Per Dr. Cordelia Pen office note, pt was advised to f/u prn. No future appt noted. Uncertain if this will be managed by Dr. Quay Burow. Routing to Dr. Loanne Drilling for him to review and advise.

## 2019-05-15 NOTE — Telephone Encounter (Signed)
Me or Dr Sharlet Salina: Either is fine with me.

## 2019-05-15 NOTE — Telephone Encounter (Signed)
Per Dr. Cordelia Pen request, please call pt to schedule appt

## 2019-05-15 NOTE — Telephone Encounter (Signed)
Does pt require an appt?

## 2019-05-15 NOTE — Telephone Encounter (Signed)
Please advise 

## 2019-05-16 ENCOUNTER — Encounter: Payer: Self-pay | Admitting: Internal Medicine

## 2019-05-16 NOTE — Telephone Encounter (Signed)
Per Dr. Ellison, unable to refill Levothyroxine without an appt. Routing this message to the front desk for scheduling purposes.  

## 2019-05-17 ENCOUNTER — Ambulatory Visit (INDEPENDENT_AMBULATORY_CARE_PROVIDER_SITE_OTHER): Payer: 59 | Admitting: Psychology

## 2019-05-17 DIAGNOSIS — F331 Major depressive disorder, recurrent, moderate: Secondary | ICD-10-CM

## 2019-05-17 DIAGNOSIS — F411 Generalized anxiety disorder: Secondary | ICD-10-CM

## 2019-05-22 ENCOUNTER — Other Ambulatory Visit: Payer: Self-pay

## 2019-05-22 ENCOUNTER — Encounter: Payer: Self-pay | Admitting: Internal Medicine

## 2019-05-22 MED ORDER — LEVOTHYROXINE SODIUM 100 MCG PO TABS: 100.0000 ug | ORAL_TABLET | Freq: Every day | ORAL | 0 refills | Status: DC

## 2019-05-24 NOTE — Telephone Encounter (Signed)
Patient states when she last saw Dr. Loanne Drilling, Dr. Loanne Drilling told patient that she did not need to come back unless she needed to see him. Currently patient is being treated by her PCP. Patient states she is doing fine and has remained stable. Patient does not feel she needs to schedule an appointment to see Dr. Loanne Drilling at this time.  Patient states-Patient's PCP fills her prescriptions.

## 2019-05-24 NOTE — Telephone Encounter (Signed)
Patient states when she last saw Dr. Loanne Drilling, Dr. Loanne Drilling told patient that she did not need to come back unless she needed to see him. Currently patient is being treated by her PCP. Patient states she is doing fine and has remained stable. Patient does not feel she needs to schedule an appointment to see Dr. Loanne Drilling at this time.

## 2019-06-20 ENCOUNTER — Ambulatory Visit (INDEPENDENT_AMBULATORY_CARE_PROVIDER_SITE_OTHER): Payer: 59 | Admitting: Psychology

## 2019-06-20 DIAGNOSIS — F330 Major depressive disorder, recurrent, mild: Secondary | ICD-10-CM | POA: Diagnosis not present

## 2019-06-20 DIAGNOSIS — F419 Anxiety disorder, unspecified: Secondary | ICD-10-CM | POA: Diagnosis not present

## 2019-06-28 ENCOUNTER — Telehealth: Payer: Self-pay | Admitting: Gastroenterology

## 2019-06-28 ENCOUNTER — Other Ambulatory Visit: Payer: Self-pay

## 2019-06-28 ENCOUNTER — Encounter: Payer: Self-pay | Admitting: Internal Medicine

## 2019-06-28 ENCOUNTER — Ambulatory Visit (HOSPITAL_COMMUNITY)
Admission: RE | Admit: 2019-06-28 | Discharge: 2019-06-28 | Disposition: A | Discharge status: Home / Self Care | Payer: 59 | Source: Ambulatory Visit | Attending: Gastroenterology | Admitting: Gastroenterology

## 2019-06-28 DIAGNOSIS — M25531 Pain in right wrist: Secondary | ICD-10-CM | POA: Diagnosis not present

## 2019-06-28 DIAGNOSIS — S59911A Unspecified injury of right forearm, initial encounter: Secondary | ICD-10-CM | POA: Diagnosis not present

## 2019-06-28 DIAGNOSIS — M79631 Pain in right forearm: Secondary | ICD-10-CM | POA: Diagnosis not present

## 2019-06-28 NOTE — Assessment & Plan Note (Signed)
PAIN GETTING WORSE.  OBTAIN R WRIST PLAIN FILM. WILL REFER TO ORTHO IF FRACTURE PRESENT. FOLLOW UP WITH PCP.

## 2019-06-28 NOTE — Telephone Encounter (Addendum)
PT CALLED ASKING ABOUT RIGHT ARM PAIN. LAST NIGHT FELL AND RIGHT WRIST HIT COUNTERTOP. RIGHT WRIST HAS BEEN PAINFUL AND SWOLLEN SINCE THAT TIME. PT SEEN IN [facility] AND EXAMINED. PE: R WRIST AND FOREARM SWOLLEN/SLIGHTLY DEFORMED, GRIP NORMAL, < 2 SEC SEC CAPILLARY REFILL.

## 2019-06-29 ENCOUNTER — Encounter (HOSPITAL_COMMUNITY): Payer: Self-pay

## 2019-06-29 ENCOUNTER — Ambulatory Visit (HOSPITAL_COMMUNITY)
Admission: RE | Admit: 2019-06-29 | Discharge: 2019-06-29 | Disposition: A | Discharge status: Home / Self Care | Payer: 59 | Source: Ambulatory Visit | Attending: Gastroenterology | Admitting: Gastroenterology

## 2019-06-29 DIAGNOSIS — M25531 Pain in right wrist: Secondary | ICD-10-CM | POA: Insufficient documentation

## 2019-06-29 DIAGNOSIS — S6991XA Unspecified injury of right wrist, hand and finger(s), initial encounter: Secondary | ICD-10-CM | POA: Diagnosis not present

## 2019-06-29 NOTE — Addendum Note (Signed)
Addended by: Danie Binder on: 06/29/2019 09:02 AM   Modules accepted: Orders

## 2019-06-29 NOTE — Telephone Encounter (Signed)
I PERSONALLY REVIEWED THE XRAY WITH DR. Jacalyn Lefevre. NO FRACTURE. RECOMMENDED WRIST FILMS.  DISCUSSED WITH PT. COMPLETE R WRIST FILMS. IF VARIANCE PERSISTS WILL DISCUSS WITH DR. HARRISON.

## 2019-07-05 ENCOUNTER — Encounter: Payer: Self-pay | Admitting: Internal Medicine

## 2019-07-06 MED ORDER — PHENTERMINE HCL 30 MG PO CAPS: 30.0000 mg | ORAL_CAPSULE | ORAL | 2 refills | Status: DC

## 2019-07-14 ENCOUNTER — Ambulatory Visit: Payer: 59 | Admitting: Internal Medicine

## 2019-07-16 ENCOUNTER — Encounter (HOSPITAL_COMMUNITY): Payer: Self-pay | Admitting: *Deleted

## 2019-07-16 ENCOUNTER — Other Ambulatory Visit: Payer: Self-pay

## 2019-07-16 ENCOUNTER — Inpatient Hospital Stay (HOSPITAL_COMMUNITY)
Admission: EM | Admit: 2019-07-16 | Discharge: 2019-07-21 | DRG: 418 | Disposition: A | Discharge status: Home / Self Care | Payer: 59 | Attending: General Surgery | Admitting: General Surgery

## 2019-07-16 DIAGNOSIS — Z20822 Contact with and (suspected) exposure to covid-19: Secondary | ICD-10-CM | POA: Diagnosis present

## 2019-07-16 DIAGNOSIS — Z6841 Body Mass Index (BMI) 40.0 and over, adult: Secondary | ICD-10-CM

## 2019-07-16 DIAGNOSIS — R0682 Tachypnea, not elsewhere classified: Secondary | ICD-10-CM

## 2019-07-16 DIAGNOSIS — R1011 Right upper quadrant pain: Secondary | ICD-10-CM | POA: Diagnosis not present

## 2019-07-16 DIAGNOSIS — Z833 Family history of diabetes mellitus: Secondary | ICD-10-CM

## 2019-07-16 DIAGNOSIS — Z79890 Hormone replacement therapy (postmenopausal): Secondary | ICD-10-CM

## 2019-07-16 DIAGNOSIS — K760 Fatty (change of) liver, not elsewhere classified: Secondary | ICD-10-CM | POA: Diagnosis present

## 2019-07-16 DIAGNOSIS — K82A1 Gangrene of gallbladder in cholecystitis: Secondary | ICD-10-CM | POA: Diagnosis present

## 2019-07-16 DIAGNOSIS — K810 Acute cholecystitis: Secondary | ICD-10-CM | POA: Diagnosis present

## 2019-07-16 DIAGNOSIS — R000 Tachycardia, unspecified: Secondary | ICD-10-CM

## 2019-07-16 DIAGNOSIS — E860 Dehydration: Secondary | ICD-10-CM | POA: Diagnosis present

## 2019-07-16 DIAGNOSIS — A419 Sepsis, unspecified organism: Secondary | ICD-10-CM | POA: Diagnosis not present

## 2019-07-16 DIAGNOSIS — E039 Hypothyroidism, unspecified: Secondary | ICD-10-CM | POA: Diagnosis not present

## 2019-07-16 DIAGNOSIS — Z79899 Other long term (current) drug therapy: Secondary | ICD-10-CM

## 2019-07-16 DIAGNOSIS — I10 Essential (primary) hypertension: Secondary | ICD-10-CM | POA: Diagnosis present

## 2019-07-16 DIAGNOSIS — F329 Major depressive disorder, single episode, unspecified: Secondary | ICD-10-CM | POA: Diagnosis present

## 2019-07-16 DIAGNOSIS — K8050 Calculus of bile duct without cholangitis or cholecystitis without obstruction: Secondary | ICD-10-CM | POA: Diagnosis not present

## 2019-07-16 DIAGNOSIS — K828 Other specified diseases of gallbladder: Secondary | ICD-10-CM | POA: Diagnosis not present

## 2019-07-16 DIAGNOSIS — K8000 Calculus of gallbladder with acute cholecystitis without obstruction: Principal | ICD-10-CM | POA: Diagnosis present

## 2019-07-16 DIAGNOSIS — E876 Hypokalemia: Secondary | ICD-10-CM | POA: Diagnosis present

## 2019-07-16 DIAGNOSIS — F32A Depression, unspecified: Secondary | ICD-10-CM | POA: Diagnosis present

## 2019-07-16 LAB — COMPREHENSIVE METABOLIC PANEL
ALT: 16 U/L (ref 0–44)
AST: 16 U/L (ref 15–41)
Albumin: 3.6 g/dL (ref 3.5–5.0)
Alkaline Phosphatase: 35 U/L — ABNORMAL LOW (ref 38–126)
Anion gap: 10 (ref 5–15)
BUN: 11 mg/dL (ref 6–20)
CO2: 26 mmol/L (ref 22–32)
Calcium: 8.9 mg/dL (ref 8.9–10.3)
Chloride: 99 mmol/L (ref 98–111)
Creatinine, Ser: 0.54 mg/dL (ref 0.44–1.00)
GFR calc Af Amer: >60 mL/min (ref >60)
GFR calc non Af Amer: >60 mL/min (ref >60)
Glucose, Bld: 120 mg/dL — ABNORMAL HIGH (ref 70–99)
Potassium: 3.6 mmol/L (ref 3.5–5.1)
Sodium: 135 mmol/L (ref 135–145)
Total Bilirubin: 0.5 mg/dL (ref 0.3–1.2)
Total Protein: 7.4 g/dL (ref 6.5–8.1)

## 2019-07-16 LAB — URINALYSIS, ROUTINE W REFLEX MICROSCOPIC
Bilirubin Urine: NEGATIVE
Glucose, UA: NEGATIVE mg/dL
Hgb urine dipstick: NEGATIVE
Ketones, ur: 20 mg/dL — AB
Leukocytes,Ua: NEGATIVE
Nitrite: NEGATIVE
Protein, ur: NEGATIVE mg/dL
Specific Gravity, Urine: 1.031 — ABNORMAL HIGH (ref 1.005–1.030)
pH: 5 (ref 5.0–8.0)

## 2019-07-16 LAB — CBC
HCT: 40.2 % (ref 36.0–46.0)
Hemoglobin: 13 g/dL (ref 12.0–15.0)
MCH: 30.4 pg (ref 26.0–34.0)
MCHC: 32.3 g/dL (ref 30.0–36.0)
MCV: 94.1 fL (ref 80.0–100.0)
Platelets: 396 10*3/uL (ref 150–400)
RBC: 4.27 MIL/uL (ref 3.87–5.11)
RDW: 13.5 % (ref 11.5–15.5)
WBC: 19.1 10*3/uL — ABNORMAL HIGH (ref 4.0–10.5)
nRBC: 0 % (ref 0.0–0.2)

## 2019-07-16 LAB — PREGNANCY, URINE: Preg Test, Ur: NEGATIVE

## 2019-07-16 LAB — LIPASE, BLOOD: Lipase: 20 U/L (ref 11–51)

## 2019-07-16 MED ORDER — SODIUM CHLORIDE 0.9 % IV BOLUS
1000.0000 mL | Freq: Once | INTRAVENOUS | Status: AC
  Administered 2019-07-16: 1000 mL via INTRAVENOUS

## 2019-07-16 MED ORDER — PIPERACILLIN-TAZOBACTAM 3.375 G IVPB 30 MIN
3.3750 g | Freq: Once | INTRAVENOUS | Status: AC
  Administered 2019-07-16: 3.375 g via INTRAVENOUS
  Filled 2019-07-16: qty 50

## 2019-07-16 MED ORDER — HYDROMORPHONE HCL 1 MG/ML IJ SOLN
1.0000 mg | Freq: Once | INTRAMUSCULAR | Status: AC
  Administered 2019-07-16: 21:00:00 1 mg via INTRAVENOUS
  Filled 2019-07-16: qty 1

## 2019-07-16 MED ORDER — ONDANSETRON HCL 4 MG/2ML IJ SOLN
4.0000 mg | Freq: Once | INTRAMUSCULAR | Status: AC
  Administered 2019-07-16: 4 mg via INTRAVENOUS
  Filled 2019-07-16: qty 2

## 2019-07-16 MED ORDER — HYDROMORPHONE HCL 1 MG/ML IJ SOLN
1.0000 mg | Freq: Once | INTRAMUSCULAR | Status: AC
  Administered 2019-07-16: 23:00:00 1 mg via INTRAVENOUS
  Filled 2019-07-16: qty 1

## 2019-07-16 NOTE — ED Provider Notes (Signed)
[facility] EMERGENCY DEPARTMENT Provider Note   CSN: [id number] Arrival date & time: 07/16/19  2017     History Chief Complaint  Patient presents with  . Flank Pain    Cheryl Hampton is a 34 y.o. female.  Patient c/o ruq abd pain. Symptoms acute onset this AM, at rest, around 3 AM. Constant, dull pain, ruq, radiating around to back, without specific exacerbating or alleviating factors. No hx same symptoms in past. States went to bed last pm, and felt fine/normal. Went to [place] ED early this AM - had labs and CT - was told GB distended, but otherwise normal. Since d/c from ED, pain persists, slowly feels worse. +nausea. No vomiting. No fever. No abd distension. No prior abd surgery. Normal bm yesterday. No dysuria or hematuria. Lnmp 3 weeks ago. No vaginal discharge or bleeding. No cough or uri symptoms. No sob.   The history is provided by the patient.  Flank Pain Associated symptoms include abdominal pain. Pertinent negatives include no chest pain, no headaches and no shortness of breath.       Past Medical History:  Diagnosis Date  . Depression    Post partum took lexapro and wellbutrin and it helped  . Hypothyroid 2012 dx   post partum hyperthyroid  . Migraine headache     Patient Active Problem List   Diagnosis Date Noted  . Wrist pain, acute, right 06/28/2019  . Essential hypertension 04/24/2019  . Anxiety 01/13/2019  . Menorrhagia 03/21/2018  . Sciatica of right side 02/05/2017  . 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 Nonallopathic lesion of sacral region 07/24/2016  . Nonallopathic lesion of thoracic region 07/24/2016  . Nonallopathic lesion of lumbosacral region 07/24/2016  . Chronic midline low back pain without sciatica 06/12/2016  . Hair loss 06/12/2016  . Hypothyroidism 06/12/2016  . Obesity 01/09/2013  . Depression   . Migraine headache 02/06/2011    Past Surgical History:  Procedure Laterality Date  . MOUTH SURGERY    .  Removal (R) Fallopian tube  09/2009     OB History    Gravida  3   Para  2   Term  2   Preterm      AB  1   Living  2     SAB      TAB      Ectopic  1   Multiple  0   Live Births  2           Family History  Problem Relation Age of Onset  . Alcohol abuse Maternal Uncle   . Pancreatic cancer Maternal Grandmother   . Stroke Maternal Grandfather   . Hypertension Mother   . Diabetes Mother   . Hyperlipidemia Mother   . Thyroid disease Neg Hx     Social History   Tobacco Use  . Smoking status: Never Smoker  . Smokeless tobacco: Never Used  Substance Use Topics  . Alcohol use: No  . Drug use: No    Home Medications Prior to Admission medications   Medication Sig Start Date End Date Taking? Authorizing Provider  butalbital-acetaminophen-caffeine (FIORICET, ESGIC) 50-325-40 MG tablet Take 1 tablet by mouth every 4 (four) hours as needed for headache. 07/15/18   Binnie Rail, MD  levothyroxine (SYNTHROID) 100 MCG tablet Take 1 tablet (100 mcg total) by mouth daily. 05/22/19   Binnie Rail, MD  lisinopril-hydrochlorothiazide (ZESTORETIC) 10-12.5 MG tablet Take 1 tablet by mouth daily. 04/24/19   Billey Gosling  J, MD  Norethindrone-Ethinyl Estradiol-Fe Biphas (LO LOESTRIN FE) 1 MG-10 MCG / 10 MCG tablet Lo Loestrin Fe 1 mg-10 mcg (24)/10 mcg (2) tablet  TAKE 1 TABLET BY MOUTH ONCE DAILY    [provider]  phentermine 30 MG capsule Take 1 capsule (30 mg total) by mouth every morning. 07/06/19   Binnie Rail, MD  venlafaxine XR (EFFEXOR-XR) 150 MG 24 hr capsule Take 1 capsule (150 mg total) by mouth daily with breakfast. 05/03/19   Binnie Rail, MD    Allergies    Gabapentin  Review of Systems   Review of Systems  Constitutional: Negative for fever.  HENT: Negative for sore throat.   Eyes: Negative for redness.  Respiratory: Negative for cough and shortness of breath.   Cardiovascular: Negative for chest pain.  Gastrointestinal: Positive for  abdominal pain and nausea.  Genitourinary: Negative for dysuria and hematuria.  Musculoskeletal: Negative for neck pain.  Skin: Negative for rash.  Neurological: Negative for headaches.  Hematological: Does not bruise/bleed easily.  Psychiatric/Behavioral: Negative for confusion.    Physical Exam Updated Vital Signs BP 129/77 (BP Location: Right Arm)   Pulse 87   Temp 98.2 F (36.8 C) (Oral)   Resp 16   Ht 1.626 m (5\' 4" )   Wt 99.8 kg   LMP 06/23/2019   SpO2 100%   BMI 37.76 kg/m   Physical Exam Vitals and nursing note reviewed.  Constitutional:      Appearance: Normal appearance. She is well-developed.  HENT:     Head: Atraumatic.     Nose: Nose normal.     Mouth/Throat:     Mouth: Mucous membranes are moist.  Eyes:     General: No scleral icterus.    Conjunctiva/sclera: Conjunctivae normal.  Neck:     Trachea: No tracheal deviation.  Cardiovascular:     Rate and Rhythm: Normal rate and regular rhythm.     Pulses: Normal pulses.     Heart sounds: Normal heart sounds. No murmur. No friction rub. No gallop.   Pulmonary:     Effort: Pulmonary effort is normal. No respiratory distress.     Breath sounds: Normal breath sounds.  Abdominal:     General: Bowel sounds are normal. There is no distension.     Palpations: Abdomen is soft. There is no mass.     Tenderness: There is abdominal tenderness. There is no guarding or rebound.     Hernia: No hernia is present.     Comments: RUQ tenderness.   Genitourinary:    Comments: No cva tenderness.  Musculoskeletal:        General: No swelling or tenderness.     Cervical back: Normal range of motion and neck supple. No rigidity. No muscular tenderness.     Right lower leg: No edema.     Left lower leg: No edema.  Skin:    General: Skin is warm and dry.     Findings: No rash.     Comments: No skin lesions or rash in area of pain.   Neurological:     Mental Status: She is alert.     Comments: Alert, speech normal.     Psychiatric:        Mood and Affect: Mood normal.     ED Results / Procedures / Treatments   Labs (all labs ordered are listed, but only abnormal results are displayed) Results for orders placed or performed during the hospital encounter of 07/16/19  CBC  Result Value Ref Range   WBC 19.1 (H) 4.0 - 10.5 K/uL   RBC 4.27 3.87 - 5.11 MIL/uL   Hemoglobin 13.0 12.0 - 15.0 g/dL   HCT 40.2 36.0 - 46.0 %   MCV 94.1 80.0 - 100.0 fL   MCH 30.4 26.0 - 34.0 pg   MCHC 32.3 30.0 - 36.0 g/dL   RDW 13.5 11.5 - 15.5 %   Platelets 396 150 - 400 K/uL   nRBC 0.0 0.0 - 0.2 %  Comprehensive metabolic panel  Result Value Ref Range   Sodium 135 135 - 145 mmol/L   Potassium 3.6 3.5 - 5.1 mmol/L   Chloride 99 98 - 111 mmol/L   CO2 26 22 - 32 mmol/L   Glucose, Bld 120 (H) 70 - 99 mg/dL   BUN 11 6 - 20 mg/dL   Creatinine, Ser 0.54 0.44 - 1.00 mg/dL   Calcium 8.9 8.9 - 10.3 mg/dL   Total Protein 7.4 6.5 - 8.1 g/dL   Albumin 3.6 3.5 - 5.0 g/dL   AST 16 15 - 41 U/L   ALT 16 0 - 44 U/L   Alkaline Phosphatase 35 (L) 38 - 126 U/L   Total Bilirubin 0.5 0.3 - 1.2 mg/dL   GFR calc non Af Amer >60 >60 mL/min   GFR calc Af Amer >60 >60 mL/min   Anion gap 10 5 - 15  Lipase, blood  Result Value Ref Range   Lipase 20 11 - 51 U/L     EKG None  Radiology No results found.  Procedures Procedures (including critical care time)  Medications Ordered in ED Medications  sodium chloride 0.9 % bolus 1,000 mL (has no administration in time range)  HYDROmorphone (DILAUDID) injection 1 mg (has no administration in time range)  ondansetron (ZOFRAN) injection 4 mg (has no administration in time range)    ED Course  I have reviewed the triage vital signs and the nursing notes.  Pertinent labs & imaging results that were available during my care of the patient were reviewed by me and considered in my medical decision making (see chart for details).    MDM Rules/Calculators/A&P                      Iv  ns bolus. Dilaudid iv. zofran iv. Labs.   Reviewed nursing notes and prior charts for additional history. Reviewed CT from [facility] ED earlier today - neg acute, except for mention 'distended GB'.   Pain improved w meds. Await labs.  Labs reviewed/interpreted by me - wbc elevated, 19. LFTS and lipase normal.  Recheck pain return, persists, ruq. +ruq tenderness.   Additional dilaudid 1 mg iv. Urine tests remain pending.   Plan to check urine studies when resulted, and assess response to pain. Given persistent ruq tenderness, elevated wbc, will cover with dose iv abx.  Will plan for u/s as soon as able, first thing in AM.   Signed out to Dr Sedonia Small.      Final Clinical Impression(s) / ED Diagnoses Final diagnoses:  None    Rx / DC Orders ED Discharge Orders    None       Lajean Saver, MD 07/16/19 2250

## 2019-07-16 NOTE — ED Provider Notes (Signed)
Provider Note MRN:  [id number]  Arrival date & time: 07/17/19    ED Course and Medical Decision Making  Assumed care from Dr. Ashok Cordia at shift change.  Suspected cholecystitis, recent CT earlier today with distended GB.  Will provide pain control and IV abx overnight and have US performed in the morning.  2 AM update: Upward trending patient's heart rate overnight, on my assessment continues to have significant pain.  I attempted bedside ultrasound to ideally obtain obvious evidence of cholecystitis and possibly expedite her admission.  Unfortunately she has poor acoustic windows due to body habitus.  Will need formal ultrasound.  Has already received antibiotics, will provide further pain control and continuous maintenance fluids.  3 AM update: Heart rate continues to climb to the 130s despite further resuscitation with fluids, pain continues to worsen, very tender on exam, still normotensive, but with the marked leukocytosis there is concern for intra-abdominal sepsis.  Discussed pros and cons of repeat CT imaging with the patient.  I feel at this point the risk of waiting 4 or 5 hours for an ultrasound outweighs the risk of added radiation exposure with a second CT scan.  Patient agrees to CT scan to further evaluate.  4 AM update: CT confirms acute cholecystitis, will consult general surgery and admit to the hospital.   EKG Interpretation  Date/Time:  Monday July 17 2019 03:04:39 EST Ventricular Rate:  133 PR Interval:    QRS Duration: 85 QT Interval:  264 QTC Calculation: 393 R Axis:   14 Text Interpretation: Sinus tachycardia Confirmed by Gerlene Fee [phone]) on 07/17/2019 3:07:38 AM      Cardiac monitoring was ordered to monitor the patient for dysrhythmia.  I personally interpreted the patient's cardiac monitor while at the bedside. 07/17/2019 4:14 AM narrow complex tachycardia    Ultrasound ED Abd  Date/Time: 07/17/2019 2:05 AM Performed by: Maudie Flakes,  MD Authorized by: Maudie Flakes, MD   Procedure details:    Indications: abdominal pain     Assessment for:  Gallstones   Hepatobiliary:  Visualized      Study Limitations: body habitus and patient compliance Hepatobiliary findings:    Common bile duct:  Unable to visualize   Gallbladder wall:  Unable to visualize Comments:     Difficult exam due to body habitus, no definitive conclusions.  .Critical Care Performed by: Maudie Flakes, MD Authorized by: Maudie Flakes, MD   Critical care provider statement:    Critical care time (minutes):  35   Critical care was necessary to treat or prevent imminent or life-threatening deterioration of the following conditions: Concern for sepsis.   Critical care was time spent personally by me on the following activities:  Discussions with consultants, evaluation of patient's response to treatment, examination of patient, ordering and performing treatments and interventions, ordering and review of laboratory studies, ordering and review of radiographic studies, pulse oximetry, re-evaluation of patient's condition, obtaining history from patient or surrogate and review of old charts    Final Clinical Impressions(s) / ED Diagnoses     ICD-10-CM   1. RUQ abdominal pain  R10.11   2. RUQ pain  R10.11 US Abdomen Limited RUQ    US Abdomen Limited RUQ  3. Acute cholecystitis  K81.0   4. Sepsis, due to unspecified organism, unspecified whether acute organ dysfunction present [facility])  A41.9     ED Discharge Orders    None      Discharge Instructions   None  Barth Kirks. Sedonia Small, [place] [email or web address]    Maudie Flakes, MD 07/17/19 [phone]

## 2019-07-16 NOTE — ED Notes (Signed)
Pt reminded of need for urine sample.  

## 2019-07-16 NOTE — ED Triage Notes (Signed)
Pt with right flank pain that radiates to chest and up back.  Pt seen at [facility] and dx with biliary colic and biliary dyskinesia.  Pt with N/V.

## 2019-07-17 ENCOUNTER — Inpatient Hospital Stay (HOSPITAL_COMMUNITY): Payer: 59

## 2019-07-17 ENCOUNTER — Emergency Department (HOSPITAL_COMMUNITY): Payer: 59

## 2019-07-17 DIAGNOSIS — E876 Hypokalemia: Secondary | ICD-10-CM | POA: Diagnosis present

## 2019-07-17 DIAGNOSIS — F329 Major depressive disorder, single episode, unspecified: Secondary | ICD-10-CM | POA: Diagnosis not present

## 2019-07-17 DIAGNOSIS — E039 Hypothyroidism, unspecified: Secondary | ICD-10-CM | POA: Diagnosis not present

## 2019-07-17 DIAGNOSIS — A419 Sepsis, unspecified organism: Secondary | ICD-10-CM

## 2019-07-17 DIAGNOSIS — K828 Other specified diseases of gallbladder: Secondary | ICD-10-CM | POA: Diagnosis present

## 2019-07-17 DIAGNOSIS — K7689 Other specified diseases of liver: Secondary | ICD-10-CM | POA: Diagnosis not present

## 2019-07-17 DIAGNOSIS — Z6841 Body Mass Index (BMI) 40.0 and over, adult: Secondary | ICD-10-CM | POA: Diagnosis not present

## 2019-07-17 DIAGNOSIS — R1011 Right upper quadrant pain: Secondary | ICD-10-CM | POA: Diagnosis not present

## 2019-07-17 DIAGNOSIS — Z79899 Other long term (current) drug therapy: Secondary | ICD-10-CM | POA: Diagnosis not present

## 2019-07-17 DIAGNOSIS — K810 Acute cholecystitis: Secondary | ICD-10-CM | POA: Diagnosis not present

## 2019-07-17 DIAGNOSIS — K8000 Calculus of gallbladder with acute cholecystitis without obstruction: Secondary | ICD-10-CM | POA: Diagnosis not present

## 2019-07-17 DIAGNOSIS — R0682 Tachypnea, not elsewhere classified: Secondary | ICD-10-CM | POA: Diagnosis not present

## 2019-07-17 DIAGNOSIS — E860 Dehydration: Secondary | ICD-10-CM | POA: Diagnosis present

## 2019-07-17 DIAGNOSIS — R000 Tachycardia, unspecified: Secondary | ICD-10-CM | POA: Diagnosis not present

## 2019-07-17 DIAGNOSIS — K82A1 Gangrene of gallbladder in cholecystitis: Secondary | ICD-10-CM | POA: Diagnosis present

## 2019-07-17 DIAGNOSIS — I10 Essential (primary) hypertension: Secondary | ICD-10-CM | POA: Diagnosis not present

## 2019-07-17 DIAGNOSIS — Z833 Family history of diabetes mellitus: Secondary | ICD-10-CM | POA: Diagnosis not present

## 2019-07-17 DIAGNOSIS — Z79890 Hormone replacement therapy (postmenopausal): Secondary | ICD-10-CM | POA: Diagnosis not present

## 2019-07-17 DIAGNOSIS — K760 Fatty (change of) liver, not elsewhere classified: Secondary | ICD-10-CM | POA: Diagnosis present

## 2019-07-17 DIAGNOSIS — Z20822 Contact with and (suspected) exposure to covid-19: Secondary | ICD-10-CM | POA: Diagnosis present

## 2019-07-17 LAB — HIV ANTIBODY (ROUTINE TESTING W REFLEX): HIV Screen 4th Generation wRfx: NONREACTIVE

## 2019-07-17 LAB — COMPREHENSIVE METABOLIC PANEL
ALT: 13 U/L (ref 0–44)
AST: 13 U/L — ABNORMAL LOW (ref 15–41)
Albumin: 2.6 g/dL — ABNORMAL LOW (ref 3.5–5.0)
Alkaline Phosphatase: 27 U/L — ABNORMAL LOW (ref 38–126)
Anion gap: 5 (ref 5–15)
BUN: 8 mg/dL (ref 6–20)
CO2: 26 mmol/L (ref 22–32)
Calcium: 7.4 mg/dL — ABNORMAL LOW (ref 8.9–10.3)
Chloride: 103 mmol/L (ref 98–111)
Creatinine, Ser: 0.56 mg/dL (ref 0.44–1.00)
GFR calc Af Amer: >60 mL/min (ref >60)
GFR calc non Af Amer: >60 mL/min (ref >60)
Glucose, Bld: 104 mg/dL — ABNORMAL HIGH (ref 70–99)
Potassium: 3.4 mmol/L — ABNORMAL LOW (ref 3.5–5.1)
Sodium: 134 mmol/L — ABNORMAL LOW (ref 135–145)
Total Bilirubin: 0.8 mg/dL (ref 0.3–1.2)
Total Protein: 5.6 g/dL — ABNORMAL LOW (ref 6.5–8.1)

## 2019-07-17 LAB — BASIC METABOLIC PANEL
Anion gap: 10 (ref 5–15)
BUN: 6 mg/dL (ref 6–20)
CO2: 17 mmol/L — ABNORMAL LOW (ref 22–32)
Calcium: 7.5 mg/dL — ABNORMAL LOW (ref 8.9–10.3)
Chloride: 106 mmol/L (ref 98–111)
Creatinine, Ser: 0.67 mg/dL (ref 0.44–1.00)
GFR calc Af Amer: >60 mL/min (ref >60)
GFR calc non Af Amer: >60 mL/min (ref >60)
Glucose, Bld: 127 mg/dL — ABNORMAL HIGH (ref 70–99)
Potassium: 4.8 mmol/L (ref 3.5–5.1)
Sodium: 133 mmol/L — ABNORMAL LOW (ref 135–145)

## 2019-07-17 LAB — CBC WITH DIFFERENTIAL/PLATELET
Abs Immature Granulocytes: 0.23 10*3/uL — ABNORMAL HIGH (ref 0.00–0.07)
Basophils Absolute: 0.1 10*3/uL (ref 0.0–0.1)
Basophils Relative: 0 %
Eosinophils Absolute: 0 10*3/uL (ref 0.0–0.5)
Eosinophils Relative: 0 %
HCT: 34.3 % — ABNORMAL LOW (ref 36.0–46.0)
Hemoglobin: 10.8 g/dL — ABNORMAL LOW (ref 12.0–15.0)
Immature Granulocytes: 1 %
Lymphocytes Relative: 5 %
Lymphs Abs: 1.1 10*3/uL (ref 0.7–4.0)
MCH: 30.4 pg (ref 26.0–34.0)
MCHC: 31.5 g/dL (ref 30.0–36.0)
MCV: 96.6 fL (ref 80.0–100.0)
Monocytes Absolute: 1.6 10*3/uL — ABNORMAL HIGH (ref 0.1–1.0)
Monocytes Relative: 7 %
Neutro Abs: 20.3 10*3/uL — ABNORMAL HIGH (ref 1.7–7.7)
Neutrophils Relative %: 87 %
Platelets: 343 10*3/uL (ref 150–400)
RBC: 3.55 MIL/uL — ABNORMAL LOW (ref 3.87–5.11)
RDW: 13.7 % (ref 11.5–15.5)
WBC: 23.3 10*3/uL — ABNORMAL HIGH (ref 4.0–10.5)
nRBC: 0 % (ref 0.0–0.2)

## 2019-07-17 LAB — RESPIRATORY PANEL BY RT PCR (FLU A&B, COVID)
Influenza A by PCR: NEGATIVE
Influenza B by PCR: NEGATIVE
SARS Coronavirus 2 by RT PCR: NEGATIVE

## 2019-07-17 LAB — SURGICAL PCR SCREEN
MRSA, PCR: NEGATIVE
Staphylococcus aureus: NEGATIVE

## 2019-07-17 LAB — MAGNESIUM: Magnesium: 1.2 mg/dL — ABNORMAL LOW (ref 1.7–2.4)

## 2019-07-17 LAB — APTT: aPTT: 30 seconds (ref 24–36)

## 2019-07-17 LAB — MRSA PCR SCREENING: MRSA by PCR: NEGATIVE

## 2019-07-17 LAB — PROTIME-INR
INR: 1.2 (ref 0.8–1.2)
Prothrombin Time: 14.6 seconds (ref 11.4–15.2)

## 2019-07-17 LAB — LACTIC ACID, PLASMA: Lactic Acid, Venous: 1.8 mmol/L (ref 0.5–1.9)

## 2019-07-17 LAB — PHOSPHORUS: Phosphorus: 1.8 mg/dL — ABNORMAL LOW (ref 2.5–4.6)

## 2019-07-17 MED ORDER — SODIUM CHLORIDE 0.9 % IV SOLN
2.0000 g | Freq: Three times a day (TID) | INTRAVENOUS | Status: DC
  Filled 2019-07-17: qty 2

## 2019-07-17 MED ORDER — LACTATED RINGERS IV SOLN: INTRAVENOUS | Status: DC

## 2019-07-17 MED ORDER — ACETAMINOPHEN 325 MG PO TABS: 650.0000 mg | ORAL_TABLET | Freq: Four times a day (QID) | ORAL | Status: DC | PRN

## 2019-07-17 MED ORDER — ENOXAPARIN SODIUM 40 MG/0.4ML ~~LOC~~ SOLN
40.0000 mg | Freq: Once | SUBCUTANEOUS | Status: AC
  Administered 2019-07-18: 40 mg via SUBCUTANEOUS
  Filled 2019-07-17: qty 0.4

## 2019-07-17 MED ORDER — CHLORHEXIDINE GLUCONATE CLOTH 2 % EX PADS
6.0000 | MEDICATED_PAD | Freq: Every day | CUTANEOUS | Status: DC
  Administered 2019-07-17: 6 via TOPICAL

## 2019-07-17 MED ORDER — LEVOTHYROXINE SODIUM 100 MCG/5ML IV SOLN
50.0000 ug | Freq: Every day | INTRAVENOUS | Status: DC
  Filled 2019-07-17 (×2): qty 5

## 2019-07-17 MED ORDER — SODIUM CHLORIDE 0.9 % IV SOLN: INTRAVENOUS | Status: DC

## 2019-07-17 MED ORDER — FENTANYL CITRATE (PF) 100 MCG/2ML IJ SOLN
100.0000 ug | INTRAMUSCULAR | Status: DC | PRN
  Administered 2019-07-18: 100 ug via INTRAVENOUS
  Filled 2019-07-17: qty 2

## 2019-07-17 MED ORDER — MAGNESIUM SULFATE 4 GM/100ML IV SOLN
4.0000 g | Freq: Once | INTRAVENOUS | Status: AC
  Administered 2019-07-17: 4 g via INTRAVENOUS
  Filled 2019-07-17: qty 100

## 2019-07-17 MED ORDER — MUPIROCIN 2 % EX OINT
1.0000 "application " | TOPICAL_OINTMENT | Freq: Two times a day (BID) | CUTANEOUS | Status: DC
  Administered 2019-07-18 – 2019-07-19 (×2): 1 via NASAL

## 2019-07-17 MED ORDER — SODIUM CHLORIDE 0.9 % IV SOLN: 2.0000 g | Freq: Once | INTRAVENOUS | Status: DC

## 2019-07-17 MED ORDER — METRONIDAZOLE IN NACL 5-0.79 MG/ML-% IV SOLN
500.0000 mg | Freq: Three times a day (TID) | INTRAVENOUS | Status: DC
  Administered 2019-07-17: 500 mg via INTRAVENOUS
  Filled 2019-07-17: qty 100

## 2019-07-17 MED ORDER — FENTANYL CITRATE (PF) 100 MCG/2ML IJ SOLN: 75.0000 ug | INTRAMUSCULAR | Status: DC | PRN

## 2019-07-17 MED ORDER — LACTATED RINGERS IV BOLUS
1000.0000 mL | Freq: Once | INTRAVENOUS | Status: AC
  Administered 2019-07-17: 1000 mL via INTRAVENOUS

## 2019-07-17 MED ORDER — HYDROMORPHONE HCL 1 MG/ML IJ SOLN
1.0000 mg | Freq: Once | INTRAMUSCULAR | Status: AC
  Administered 2019-07-17: 1 mg via INTRAVENOUS
  Filled 2019-07-17: qty 1

## 2019-07-17 MED ORDER — ORAL CARE MOUTH RINSE
15.0000 mL | Freq: Two times a day (BID) | OROMUCOSAL | Status: DC
  Administered 2019-07-17 – 2019-07-21 (×7): 15 mL via OROMUCOSAL

## 2019-07-17 MED ORDER — PIPERACILLIN-TAZOBACTAM 3.375 G IVPB 30 MIN
3.3750 g | Freq: Four times a day (QID) | INTRAVENOUS | Status: DC
  Administered 2019-07-17: 3.375 g via INTRAVENOUS
  Filled 2019-07-17: qty 50

## 2019-07-17 MED ORDER — HYDROMORPHONE HCL 1 MG/ML IJ SOLN
1.0000 mg | Freq: Once | INTRAMUSCULAR | Status: AC
  Administered 2019-07-17: 02:00:00 1 mg via INTRAVENOUS
  Filled 2019-07-17: qty 1

## 2019-07-17 MED ORDER — POTASSIUM CHLORIDE IN NACL 40-0.9 MEQ/L-% IV SOLN
INTRAVENOUS | Status: DC
  Administered 2019-07-17: 125 mL/h via INTRAVENOUS
  Filled 2019-07-17 (×5): qty 1000

## 2019-07-17 MED ORDER — HYDROMORPHONE HCL 1 MG/ML IJ SOLN
1.0000 mg | INTRAMUSCULAR | Status: DC | PRN
  Administered 2019-07-17: 1 mg via INTRAVENOUS
  Filled 2019-07-17: qty 1

## 2019-07-17 MED ORDER — PROCHLORPERAZINE EDISYLATE 10 MG/2ML IJ SOLN
10.0000 mg | Freq: Four times a day (QID) | INTRAMUSCULAR | Status: DC | PRN
  Administered 2019-07-17: 10 mg via INTRAVENOUS
  Filled 2019-07-17: qty 2

## 2019-07-17 MED ORDER — FAMOTIDINE IN NACL 20-0.9 MG/50ML-% IV SOLN
20.0000 mg | Freq: Once | INTRAVENOUS | Status: AC
  Administered 2019-07-17: 20 mg via INTRAVENOUS
  Filled 2019-07-17: qty 50

## 2019-07-17 MED ORDER — LORAZEPAM 2 MG/ML IJ SOLN
1.0000 mg | INTRAMUSCULAR | Status: DC | PRN
  Administered 2019-07-17 – 2019-07-20 (×3): 1 mg via INTRAVENOUS
  Filled 2019-07-17 (×3): qty 1

## 2019-07-17 MED ORDER — ACETAMINOPHEN 650 MG RE SUPP: 650.0000 mg | Freq: Four times a day (QID) | RECTAL | Status: DC | PRN

## 2019-07-17 MED ORDER — FENTANYL CITRATE (PF) 100 MCG/2ML IJ SOLN
50.0000 ug | INTRAMUSCULAR | Status: DC | PRN
  Administered 2019-07-17 (×2): 50 ug via INTRAVENOUS
  Filled 2019-07-17 (×2): qty 2

## 2019-07-17 MED ORDER — LACTATED RINGERS IV BOLUS
1000.0000 mL | Freq: Once | INTRAVENOUS | Status: AC
  Administered 2019-07-18: 1000 mL via INTRAVENOUS

## 2019-07-17 MED ORDER — CHLORHEXIDINE GLUCONATE CLOTH 2 % EX PADS
6.0000 | MEDICATED_PAD | Freq: Once | CUTANEOUS | Status: AC
  Administered 2019-07-17: 22:00:00 6 via TOPICAL

## 2019-07-17 MED ORDER — CHLORHEXIDINE GLUCONATE CLOTH 2 % EX PADS: 6.0000 | MEDICATED_PAD | Freq: Once | CUTANEOUS | Status: DC

## 2019-07-17 MED ORDER — SODIUM CHLORIDE 0.9 % IV BOLUS
1000.0000 mL | Freq: Once | INTRAVENOUS | Status: AC
  Administered 2019-07-17: 1000 mL via INTRAVENOUS

## 2019-07-17 MED ORDER — PIPERACILLIN-TAZOBACTAM 3.375 G IVPB 30 MIN
3.3750 g | Freq: Once | INTRAVENOUS | Status: AC
  Administered 2019-07-17: 3.375 g via INTRAVENOUS
  Filled 2019-07-17: qty 50

## 2019-07-17 MED ORDER — PIPERACILLIN-TAZOBACTAM 3.375 G IVPB
3.3750 g | Freq: Three times a day (TID) | INTRAVENOUS | Status: DC
  Administered 2019-07-17 – 2019-07-21 (×10): 3.375 g via INTRAVENOUS
  Filled 2019-07-17 (×10): qty 50

## 2019-07-17 MED ORDER — POTASSIUM PHOSPHATES 15 MMOLE/5ML IV SOLN
20.0000 mmol | Freq: Once | INTRAVENOUS | Status: AC
  Administered 2019-07-17: 10:00:00 20 mmol via INTRAVENOUS
  Filled 2019-07-17: qty 6.67

## 2019-07-17 MED ORDER — IOHEXOL 300 MG/ML  SOLN
100.0000 mL | Freq: Once | INTRAMUSCULAR | Status: AC | PRN
  Administered 2019-07-17: 100 mL via INTRAVENOUS

## 2019-07-17 MED ORDER — HYDROMORPHONE HCL 1 MG/ML IJ SOLN
1.0000 mg | INTRAMUSCULAR | Status: DC | PRN
  Administered 2019-07-17 (×3): 1 mg via INTRAVENOUS
  Filled 2019-07-17 (×3): qty 1

## 2019-07-17 NOTE — Progress Notes (Signed)
RN spoke with Dr. Arnoldo Morale after advising of HR and RR. New order to increase Fentanyl to 26mcg q2h and to page hospitalist to come see patient. Dr. Olevia Bowens paged and made aware of request. Waiting for call back. Will continue to monitor pt

## 2019-07-17 NOTE — Progress Notes (Addendum)
[facility]  Spoke with Dr. Sedonia Small. 34 yo with RUQ pain that has been going on for a few days CT at outside hospital with distended gallbladder and patient sent home. Worsening pain and came to [facility]  CT overnight with distended gallbladder and edema findings consistent with cholecystitis but no obvious stones.   She has yet to get an [place] to further evaluate and look at ductal system etc.  LFTs wnl.  Dr. Sedonia Small calling concerned as patient is tachycardic to [id number] with systolic [id number]. She has been in ED since about 8-9pm. Received 2L bolus on arrival, and on maintenance 100cc since that time.  Has received zosyn one dose and is getting a second soon.  Patient starting to look septic from her gallbladder. Her pain is controlled and she feels better. Her BP has been maintaining.   -Would be more aggressive with resuscitation and give another 2L bolus as she is 33 and otherwise healthy. -Admit to hospitalist for now. -Needs [place] RUQ stat in the AM -NPO -Continue zosyn -Repeating labs now including LFTs  -Needs rapid COVID and looks like it has been sent.   If hemodynamics not improving have to consider if patient needs cholecystostomy tube over cholecystectomy but would hate to do that in a young patient. Hopefully she will respond to more resuscitation and antibiotics.  Dr. Arnoldo Morale or myself will see her later in the AM.   Curlene Labrum, MD [facility] [address] [place], [place] [id number] [phone] (office)

## 2019-07-17 NOTE — Progress Notes (Signed)
[facility] night shift note.  The patient has been tachypneic in the 20s/30s and tachycardic in the 130s. She has right lower chest wall pleuritic chest pain and states she is unable to breathe deeply.  Repeat labs were drawn earlier and results are pasted below.  Her TSH is low and may be contributing to her tachycardia so levothyroxine was discontinued.  She has improved after IV fluids, metoprolol 5 mg IVP and change in analgesics to morphine, but continues to have significant pain. She was placed on supplemental oxygen and told to do incentive spirometry as tolerated. She is a scheduled to go to the OR in the morning. This was discussed earlier with Dr. Aviva Signs.  Will continue current treatment and adjust as needed.  TSH [id number] (Abnormal)   Collected: 07/17/19 2332   Updated: 07/18/19 0101   Specimen Type: Blood    TSH 0.060Low  uIU/mL  Brain natriuretic peptide [id number] (Abnormal)   Collected: 07/17/19 2332   Updated: 07/18/19 0057   Specimen Type: Blood    B Natriuretic Peptide 157.0High  pg/mL  Magnesium [id number]   Collected: 07/17/19 2332   Updated: 07/18/19 0055   Specimen Type: Blood    Magnesium 2.3 mg/dL  Comprehensive metabolic panel [id number] (Abnormal)   Collected: 07/17/19 2332   Updated: 07/18/19 0055   Specimen Type: Blood    Sodium  133Low  mmol/L   Potassium 3.2Low  mmol/L   Chloride 105 mmol/L   CO2 22 mmol/L   Glucose, Bld 113High  mg/dL   BUN <5Low  mg/dL   Creatinine, Ser 0.42Low  mg/dL   Calcium 7.0Low  mg/dL   Total Protein 5.6Low  g/dL   Albumin 2.6Low  g/dL   AST 11Low  U/L   ALT 12 U/L   Alkaline Phosphatase 34Low  U/L   Total Bilirubin 0.6 mg/dL   GFR calc non Af Amer >60 mL/min   GFR calc Af Amer >60 mL/min   Anion gap 6  Lactic acid, plasma [id number]   Collected: 07/17/19 2332   Updated: 07/18/19 0026   Specimen Type: Blood    Lactic Acid, Venous 0.9 mmol/L  CBC with Differential/Platelet [id number] (Abnormal)   Collected:  07/17/19 2332   Updated: 07/18/19 0017   Specimen Type: Blood    WBC 20.4High  K/uL   RBC 3.41Low  MIL/uL   Hemoglobin 10.4Low  g/dL   HCT 33.1Low  %   MCV 97.1 fL   MCH 30.5 pg   MCHC 31.4 g/dL   RDW 14.4 %   Platelets 323 K/uL   nRBC 0.0 %   Neutrophils Relative % 88 %   Neutro Abs 17.8High  K/uL   Lymphocytes Relative 6 %   Lymphs Abs 1.3 K/uL   Monocytes Relative 5 %   Monocytes Absolute 1.0 K/uL   Eosinophils Relative 0 %   Eosinophils Absolute 0.0 K/uL   Basophils Relative 0 %   Basophils Absolute 0.1 K/uL   Immature Granulocytes 1 %   Abs Immature Granulocytes 0.22High  K/uL   Tennis Must, MD

## 2019-07-17 NOTE — Progress Notes (Signed)
Patient seen and examined.  Admitted after midnight secondary to sepsis in the setting of acute cholecystitis.  WBCs in the 23,000 range, elevated heart rate, elevated respiratory rate and abnormal findings appreciated on CT scan demonstrating cholecystitis.  Patient has been started on fluid resuscitation, as needed analgesics and the use of IV antibiotics.  Lactic acid and LFTs within normal limits.  Please refer to H&P written by Dr. Olevia Bowens for further info/details on admission.  Plan: -Follow results of right upper quadrant ultrasound -After discussing with general surgery service patient care will be transfer to them and internal medicine will be contacted on as-needed basis. -Hold oral medications until able to take p.o.'s; Synthroid switch to IV route and a half dose of home regimen. -Continue IV antibiotics, IV fluids, as needed analgesics and antiemetics. -Cholecystectomy as per general surgery discretion.   Barton Dubois MD [phone]

## 2019-07-17 NOTE — Progress Notes (Signed)
Pharmacy Antibiotic Note  Cheryl Hampton is a 34 y.o. female admitted on 07/16/2019 with intra-abdominal infection.  Pharmacy has been consulted for cefepime dosing. S/p Zosyn x 2. Potential for surgery this morning.  Plan: Cefepime 2 grams IV every 8 hours Flagyl per MD Monitor clinical progress, cultures/sensitivities, renal function, abx plan   Height: 5\' 4"  (162.6 cm) Weight: 220 lb (99.8 kg) IBW/kg (Calculated) : 54.7  Temp (24hrs), Avg:98.2 F (36.8 C), Min:98.2 F (36.8 C), Max:98.2 F (36.8 C)  Recent Labs  Lab 07/16/19 2122  WBC 19.1*  CREATININE 0.54    Estimated Creatinine Clearance: 115.9 mL/min (by C-G formula based on SCr of 0.54 mg/dL).    Allergies  Allergen Reactions  . Gabapentin     Made her feel funny    Antimicrobials this admission: 1/24 Zosyn >> 1/25 1/25 Flagyl >> 1/25 Cefepime >>  Dose adjustments this admission:  Microbiology results: 1/25 RPV: negative   Thank you for allowing pharmacy to be a part of this patient's care.  Jens Som, PharmD 07/17/2019 5:28 AM

## 2019-07-17 NOTE — Consult Note (Signed)
Reason for Consult: Cholecystitis Referring Physician: Dr. Felipa Emory Cheryl Hampton is an 34 y.o. female.  HPI: Patient is a 34 year old female who started having right upper quadrant abdominal pain and right flank pain yesterday morning around 3 AM.  She had ongoing nausea and vomiting.  She went to an outside emergency room and a CT scan there revealed cholecystitis.  At the time, she was discharged home.  Due to worsening pain, nausea, and vomiting, she presented to the emergency room at [facility] for further evaluation and treatment.  She states she has never had this type of episode before.  She denies any diarrhea, constipation, melena, hematochezia, or fatty food intolerance.  She currently has a pain level of 8 out of 10.  CT scan of the abdomen revealed cholecystitis, though no stones were seen.  Ultrasound the gallbladder revealed a fatty liver with an edematous gallbladder wall, pericholecystic fluid, and biliary sludge.  The common bile duct was within normal limits.  She was also noted on admission to be tachycardic with an elevated white blood cell count.  She has been receiving fluid boluses and started on IV antibiotics.  She denies any shortness of breath or chest pain.  Past Medical History:  Diagnosis Date  . Depression    Post partum took lexapro and wellbutrin and it helped  . Hypothyroid 2012 dx   post partum hyperthyroid  . Migraine headache     Past Surgical History:  Procedure Laterality Date  . MOUTH SURGERY    . Removal (R) Fallopian tube  09/2009    Family History  Problem Relation Age of Onset  . Alcohol abuse Maternal Uncle   . Pancreatic cancer Maternal Grandmother   . Stroke Maternal Grandfather   . Hypertension Mother   . Diabetes Mother   . Hyperlipidemia Mother   . Thyroid disease Neg Hx     Social History:  reports that she has never smoked. She has never used smokeless tobacco. She reports that she does not drink alcohol or use  drugs.  Allergies:  Allergies  Allergen Reactions  . Gabapentin     Made her feel funny    Medications:  I have reviewed the patient's current medications. Scheduled: . levothyroxine  50 mcg Intravenous Daily    Results for orders placed or performed during the hospital encounter of 07/16/19 (from the past 48 hour(s))  Urinalysis, Routine w reflex microscopic     Status: Abnormal   Collection Time: 07/16/19  9:08 PM  Result Value Ref Range   Color, Urine YELLOW YELLOW   APPearance HAZY (A) CLEAR   Specific Gravity, Urine 1.031 (H) 1.005 - 1.030   pH 5.0 5.0 - 8.0   Glucose, UA NEGATIVE NEGATIVE mg/dL   Hgb urine dipstick NEGATIVE NEGATIVE   Bilirubin Urine NEGATIVE NEGATIVE   Ketones, ur 20 (A) NEGATIVE mg/dL   Protein, ur NEGATIVE NEGATIVE mg/dL   Nitrite NEGATIVE NEGATIVE   Leukocytes,Ua NEGATIVE NEGATIVE    Comment: Performed at [facility], [address] [place], [place] [address]  Pregnancy, urine     Status: None   Collection Time: 07/16/19  9:08 PM  Result Value Ref Range   Preg Test, Ur NEGATIVE NEGATIVE    Comment:        THE SENSITIVITY OF THIS METHODOLOGY IS >20 mIU/mL. Performed at [facility], [address] [place], [place] [address]   CBC     Status: Abnormal   Collection Time: 07/16/19  9:22 PM  Result Value Ref Range   WBC 19.1 (H) 4.0 - 10.5 K/uL   RBC 4.27 3.87 - 5.11 MIL/uL   Hemoglobin 13.0 12.0 - 15.0 g/dL   HCT 40.2 36.0 - 46.0 %   MCV 94.1 80.0 - 100.0 fL   MCH 30.4 26.0 - 34.0 pg   MCHC 32.3 30.0 - 36.0 g/dL   RDW 13.5 11.5 - 15.5 %   Platelets 396 150 - 400 K/uL   nRBC 0.0 0.0 - 0.2 %    Comment: Performed at [facility], [address], [place], [place] [address]  Comprehensive metabolic panel     Status: Abnormal   Collection Time: 07/16/19  9:22 PM  Result Value Ref Range   Sodium 135 135 - 145 mmol/L   Potassium 3.6 3.5 - 5.1 mmol/L   Chloride 99 98 - 111 mmol/L   CO2 26 22 - 32 mmol/L   Glucose, Bld 120 (H) 70 - 99 mg/dL    BUN 11 6 - 20 mg/dL   Creatinine, Ser 0.54 0.44 - 1.00 mg/dL   Calcium 8.9 8.9 - 10.3 mg/dL   Total Protein 7.4 6.5 - 8.1 g/dL   Albumin 3.6 3.5 - 5.0 g/dL   AST 16 15 - 41 U/L   ALT 16 0 - 44 U/L   Alkaline Phosphatase 35 (L) 38 - 126 U/L   Total Bilirubin 0.5 0.3 - 1.2 mg/dL   GFR calc non Af Amer >60 >60 mL/min   GFR calc Af Amer >60 >60 mL/min   Anion gap 10 5 - 15    Comment: Performed at [facility], [address] [place], [place] [address]  Lipase, blood     Status: None   Collection Time: 07/16/19  9:22 PM  Result Value Ref Range   Lipase 20 11 - 51 U/L    Comment: Performed at [facility], [address], [place], [place] [address]  Respiratory Panel by RT PCR (Flu A&B, Covid) - Nasopharyngeal Swab     Status: None   Collection Time: 07/17/19  4:19 AM   Specimen: Nasopharyngeal Swab  Result Value Ref Range   SARS Coronavirus 2 by RT PCR NEGATIVE NEGATIVE    Comment: (NOTE) SARS-CoV-2 target nucleic acids are NOT DETECTED. The SARS-CoV-2 RNA is generally detectable in upper respiratoy specimens during the acute phase of infection. The lowest concentration of SARS-CoV-2 viral copies this assay can detect is 131 copies/mL. A negative result does not preclude SARS-Cov-2 infection and should not be used as the sole basis for treatment or other patient management decisions. A negative result may occur with  improper specimen collection/handling, submission of specimen other than nasopharyngeal swab, presence of viral mutation(s) within the areas targeted by this assay, and inadequate number of viral copies (<131 copies/mL). A negative result must be combined with clinical observations, patient history, and epidemiological information. The expected result is Negative. Fact Sheet for Patients:  PinkCheek.be Fact Sheet for Healthcare Providers:  GravelBags.it This test is not yet ap proved or cleared by the [place] FDA and  has been authorized for detection and/or diagnosis of SARS-CoV-2 by FDA under an Emergency Use Authorization (EUA). This EUA will remain  in effect (meaning this test can be used) for the duration of the COVID-19 declaration under Section 564(b)(1) of the Act, 21 U.S.C. section 360bbb-3(b)(1), unless the authorization is terminated or revoked sooner.    Influenza A by PCR NEGATIVE NEGATIVE   Influenza B by PCR NEGATIVE NEGATIVE  Comment: (NOTE) The Xpert Xpress SARS-CoV-2/FLU/RSV assay is intended as an aid in  the diagnosis of influenza from Nasopharyngeal swab specimens and  should not be used as a sole basis for treatment. Nasal washings and  aspirates are unacceptable for Xpert Xpress SARS-CoV-2/FLU/RSV  testing. Fact Sheet for Patients: PinkCheek.be Fact Sheet for Healthcare Providers: GravelBags.it This test is not yet approved or cleared by the [place] FDA and  has been authorized for detection and/or diagnosis of SARS-CoV-2 by  FDA under an Emergency Use Authorization (EUA). This EUA will remain  in effect (meaning this test can be used) for the duration of the  Covid-19 declaration under Section 564(b)(1) of the Act, 21  U.S.C. section 360bbb-3(b)(1), unless the authorization is  terminated or revoked. Performed at [facility], [address] [place], [place] [address]   CBC with Differential/Platelet     Status: Abnormal   Collection Time: 07/17/19  5:43 AM  Result Value Ref Range   WBC 23.3 (H) 4.0 - 10.5 K/uL   RBC 3.55 (L) 3.87 - 5.11 MIL/uL   Hemoglobin 10.8 (L) 12.0 - 15.0 g/dL   HCT 34.3 (L) 36.0 - 46.0 %   MCV 96.6 80.0 - 100.0 fL   MCH 30.4 26.0 - 34.0 pg   MCHC 31.5 30.0 - 36.0 g/dL   RDW 13.7 11.5 - 15.5 %   Platelets 343 150 - 400 K/uL   nRBC 0.0 0.0 - 0.2 %   Neutrophils Relative % 87 %   Neutro Abs 20.3 (H) 1.7 - 7.7 K/uL   Lymphocytes Relative 5 %   Lymphs Abs 1.1 0.7  - 4.0 K/uL   Monocytes Relative 7 %   Monocytes Absolute 1.6 (H) 0.1 - 1.0 K/uL   Eosinophils Relative 0 %   Eosinophils Absolute 0.0 0.0 - 0.5 K/uL   Basophils Relative 0 %   Basophils Absolute 0.1 0.0 - 0.1 K/uL   Immature Granulocytes 1 %   Abs Immature Granulocytes 0.23 (H) 0.00 - 0.07 K/uL    Comment: Performed at [facility], [address], [place], [place] [address]  Comprehensive metabolic panel     Status: Abnormal   Collection Time: 07/17/19  5:43 AM  Result Value Ref Range   Sodium 134 (L) 135 - 145 mmol/L   Potassium 3.4 (L) 3.5 - 5.1 mmol/L   Chloride 103 98 - 111 mmol/L   CO2 26 22 - 32 mmol/L   Glucose, Bld 104 (H) 70 - 99 mg/dL   BUN 8 6 - 20 mg/dL   Creatinine, Ser 0.56 0.44 - 1.00 mg/dL   Calcium 7.4 (L) 8.9 - 10.3 mg/dL   Total Protein 5.6 (L) 6.5 - 8.1 g/dL   Albumin 2.6 (L) 3.5 - 5.0 g/dL   AST 13 (L) 15 - 41 U/L   ALT 13 0 - 44 U/L   Alkaline Phosphatase 27 (L) 38 - 126 U/L   Total Bilirubin 0.8 0.3 - 1.2 mg/dL   GFR calc non Af Amer >60 >60 mL/min   GFR calc Af Amer >60 >60 mL/min   Anion gap 5 5 - 15    Comment: Performed at [facility], [address] [place], [place] [address]  Magnesium     Status: Abnormal   Collection Time: 07/17/19  5:43 AM  Result Value Ref Range   Magnesium 1.2 (L) 1.7 - 2.4 mg/dL    Comment: Performed at [facility], [address], [place], [place] [address]  Phosphorus  Status: Abnormal   Collection Time: 07/17/19  5:43 AM  Result Value Ref Range   Phosphorus 1.8 (L) 2.5 - 4.6 mg/dL    Comment: Performed at [facility], [address] [place], [place] [address]  Lactic acid, plasma     Status: None   Collection Time: 07/17/19  5:43 AM  Result Value Ref Range   Lactic Acid, Venous 1.8 0.5 - 1.9 mmol/L    Comment: Performed at [facility], [address], [place], [place] [address]  Protime-INR     Status: None   Collection Time: 07/17/19  5:43 AM  Result Value Ref Range   Prothrombin Time 14.6 11.4 - 15.2  seconds   INR 1.2 0.8 - 1.2    Comment: (NOTE) INR goal varies based on device and disease states. Performed at [facility], [address], [place], [place] [address]   APTT     Status: None   Collection Time: 07/17/19  5:43 AM  Result Value Ref Range   aPTT 30 24 - 36 seconds    Comment: Performed at [facility], [address], [place], [place] [address]    CT ABDOMEN PELVIS W CONTRAST  Result Date: 07/17/2019 CLINICAL DATA:  Right upper quadrant pain EXAM: CT ABDOMEN AND PELVIS WITH CONTRAST TECHNIQUE: Multidetector CT imaging of the abdomen and pelvis was performed using the standard protocol following bolus administration of intravenous contrast. CONTRAST:  161mL OMNIPAQUE IOHEXOL 300 MG/ML  SOLN COMPARISON:  None. FINDINGS: Lower chest:  Streaky opacity in the lower lobes from atelectasis. Hepatobiliary: No focal liver abnormality.Thick, hyperenhancing gallbladder wall with pericholecystic edema. No visible calcified cholelithiasis. No bile duct dilatation. Pancreas: Unremarkable. Spleen: Unremarkable. Adrenals/Urinary Tract: Negative adrenals. Area of heterogeneous hypoenhancement in the interpolar left kidney without rounded masslike appearance. Contrast in the urinary bladder, question scan at outside institution recently. Stomach/Bowel:  No obstruction. No evidence of bowel inflammation Vascular/Lymphatic: No acute vascular abnormality. No mass or adenopathy. Reproductive:Small posterior intramural fibroid. Other: No ascites or pneumoperitoneum. Musculoskeletal: No acute abnormalities. IMPRESSION: 1. Findings of acute cholecystitis except there are no confirmed/calcified calculi-ultrasound would be contributory. 2. Heterogeneous enhancement in the interpolar left kidney, favor focal pyelonephritis. Please correlate with urinalysis and recommend sonographic follow-up after convalescence. Electronically Signed   By: Monte Fantasia M.D.   On: 07/17/2019 04:06   US Abdomen Limited  RUQ  Result Date: 07/17/2019 CLINICAL DATA:  Severe right upper quadrant abdominal pain EXAM: ULTRASOUND ABDOMEN LIMITED RIGHT UPPER QUADRANT COMPARISON:  CT 07/17/2019 FINDINGS: Gallbladder: Diffuse circumferential gallbladder wall thickening of approximately 8 mm. Layering hyperdense, nonshadowing material within the gallbladder lumen suggesting biliary sludge. No discrete shadowing stone. A positive sonographic Murphy sign noted by sonographer. Common bile duct: Diameter: 5.4 mm Liver: No focal lesion identified. Coarsened echotexture with diffusely increased hepatic parenchymal echogenicity. Portal vein is patent on color Doppler imaging with normal direction of blood flow towards the liver. Other: None. IMPRESSION: 1. Diffuse gallbladder wall thickening with layering biliary sludge and a positive sonographic Murphy sign. Findings are suggestive of acute cholecystitis. 2. Heterogeneously echogenic appearance of the hepatic parenchyma, nonspecific, but can be seen in the setting of fatty infiltration as well as hepatitis. Correlate with serum enzymes. Electronically Signed   By: Davina Poke D.O.   On: 07/17/2019 08:34    ROS:  Pertinent items are noted in HPI.  Blood pressure 108/66, pulse (!) 117, temperature 98.2 F (36.8 C), temperature source Oral, resp. rate (!) 25, height 5\' 4"  (1.626 m), weight  99.8 kg, last menstrual period 06/23/2019, SpO2 96 %, unknown if currently breastfeeding. Physical Exam: Pleasant fatigued white female no acute distress Head is normocephalic, atraumatic Eyes without scleral icterus Lungs clear to auscultation with good breath sounds bilaterally Heart examination reveals a tachycardic rhythm which is regular.  No S3, S4, murmurs identified. Abdomen soft with tenderness in the right upper quadrant and right flank regions.  No rigidity is noted.  Hepatosplenomegaly could not be assessed due to body habitus.  Ultrasound CT scan reports  reviewed  Assessment/Plan: Impression: Acute cholecystitis with biliary sludge I suspect her tachycardia is related to her pain and dehydration from nausea and vomiting.  She also has hypomagnesemia and hypophosphatemia. Plan: We will transfer patient to the surgical service.  Patient is already receiving magnesium supplementation.  Will consult pharmacy concerning hypophosphatemia.  Continue fluid resuscitation and IV antibiotics.  The plan is to resuscitate the patient and to proceed with a laparoscopic cholecystectomy in the morning.  The risks and benefits of the procedure including bleeding, infection, hepatobiliary injury, the possibility of an open procedure were fully explained to the patient, who gave informed consent.  Cheryl Hampton 07/17/2019, 9:25 AM

## 2019-07-17 NOTE — H&P (Signed)
History and Physical    Cheryl Hampton RCV:[id number] DOB: 1986-03-10 DOA: 07/16/2019  PCP: Binnie Rail, MD   Patient coming from: Home.  I have personally briefly reviewed patient's old medical records in [place]  Chief Complaint: Right flank pain.  HPI: Cheryl Hampton is a 34 y.o. female with medical history significant of depression, hypothyroidism, migraine headaches who is coming to the emergency department due to RUQ and right flank pain radiating to her chest and back associated with about 20 episodes of emesis since Sunday 07/16/2019 at 3 AM.  She went to the [facility] ED early in the morning and her work-up showed a stone in the gallbladder, but otherwise normal.  She does not know any her labs have any abnormalities there.  She was seen on [place] and was diagnosed with a biliary colic/biliary dyskinesia.  She denies diarrhea, constipation, melena or hematochezia.  She denies dysuria, frequency or hematuria.  She denies fever, chills, rhinorrhea, sore throat, dyspnea or wheezing.  Denies dizziness, palpitations, diaphoresis, PND, orthopnea or pitting edema of the lower extremities.  No polyuria, polydipsia, polyphagia or blurred vision.  ED Course: Initial vital signs temperature 98.2 F, pulse 87, respirations 16, blood pressure 129/77 mmHg and O2 sat 100% on room air.  Received 4 doses of hydromorphone 1 mg IM, Zofran 4 mg IVP and least 4 L of NS bolus.  She also was given Zosyn 3.375 g IVPB.  General surgery was consulted.  Her white count is 19.1, hemoglobin 13.0 g/dL and platelets 396.  CMP shows a glucose of 120 mg/dL and decreased alk phos at 35 units/L.  All other values were within expected range.  Urinalysis was hazy with an increase in a specific gravity of 1.031 and ketonuria 20 mg/dL, but otherwise unremarkable.  Urine pregnancy test was negative.  SARS, influenza a and B by PCR was negative.  Lactic acid was normal.  PT is/INR/APTT within  expected range.  Imaging: CT abdomen shows findings consistent with acute cholecystitis pending configuring/calcified calculus ultrasound which could be contributory.  There is heterogeneous enhancement in the interpolar left kidney, favor focal pyelonephritis.  Correlate with urinalysis and ultrasound.  Review of Systems: As per HPI otherwise 10 point review of systems negative.   Past Medical History:  Diagnosis Date  . Depression    Post partum took lexapro and wellbutrin and it helped  . Hypothyroid 2012 dx   post partum hyperthyroid  . Migraine headache     Past Surgical History:  Procedure Laterality Date  . MOUTH SURGERY    . Removal (R) Fallopian tube  09/2009     reports that she has never smoked. She has never used smokeless tobacco. She reports that she does not drink alcohol or use drugs.  Allergies  Allergen Reactions  . Gabapentin     Made her feel funny    Family History  Problem Relation Age of Onset  . Alcohol abuse Maternal Uncle   . Pancreatic cancer Maternal Grandmother   . Stroke Maternal Grandfather   . Hypertension Mother   . Diabetes Mother   . Hyperlipidemia Mother   . Thyroid disease Neg Hx    Prior to Admission medications   Medication Sig Start Date End Date Taking? Authorizing Provider  butalbital-acetaminophen-caffeine (FIORICET, ESGIC) 50-325-40 MG tablet Take 1 tablet by mouth every 4 (four) hours as needed for headache. 07/15/18  Yes Burns, Claudina Lick, MD  levothyroxine (SYNTHROID) 100 MCG tablet Take 1 tablet (100  mcg total) by mouth daily. 05/22/19  Yes Burns, Claudina Lick, MD  lisinopril-hydrochlorothiazide (ZESTORETIC) 10-12.5 MG tablet Take 1 tablet by mouth daily. 04/24/19  Yes Burns, Claudina Lick, MD  norgestimate-ethinyl estradiol (ORTHO-CYCLEN) 0.25-35 MG-MCG tablet Take 1 tablet by mouth daily. 06/13/19  Yes [provider]  phentermine 30 MG capsule Take 1 capsule (30 mg total) by mouth every morning. 07/06/19  Yes Binnie Rail, MD    venlafaxine XR (EFFEXOR-XR) 150 MG 24 hr capsule Take 1 capsule (150 mg total) by mouth daily with breakfast. 05/03/19  Yes Binnie Rail, MD    Physical Exam: Vitals:   07/17/19 0330 07/17/19 0400 07/17/19 0430 07/17/19 0500  BP: 114/75 115/75 114/68 109/72  Pulse: (!) 129 (!) 134 (!) 132 (!) 126  Resp: (!) 23 18 (!) 31 (!) 26  Temp:      TempSrc:      SpO2: 94% 99% 96% 97%  Weight:      Height:        Constitutional: NAD, calm, comfortable Eyes: PERRL, lids and conjunctivae normal ENMT: Mucous membranes are mildly dry. Posterior pharynx clear of any exudate or lesions. Neck: normal, supple, no masses, no thyromegaly Respiratory: Tachypneic in the low to mid 20s, no wheezing, no crackles. Normal respiratory effort. No accessory muscle use.  Cardiovascular: Tachycardic in the 120s, no murmurs / rubs / gallops. No extremity edema. 2+ pedal pulses. No carotid bruits.  Abdomen: Nondistended.  Positive RUQ/right flank tenderness, no guarding or rebound, no masses palpated. No hepatosplenomegaly. Bowel sounds positive.  Musculoskeletal: no clubbing / cyanosis. Good ROM, no contractures. Normal muscle tone.  Skin: no rashes, lesions, ulcers on limited dermatological examination. Neurologic: CN 2-12 grossly intact. Sensation intact, DTR normal. Strength 5/5 in all 4.  Psychiatric: Normal judgment and insight. Alert and oriented x 3. Normal mood.   Labs on Admission: I have personally reviewed following labs and imaging studies  CBC: Recent Labs  Lab 07/16/19 2122 07/17/19 0543  WBC 19.1* 23.3*  NEUTROABS  --  20.3*  HGB 13.0 10.8*  HCT 40.2 34.3*  MCV 94.1 96.6  PLT 396 720   Basic Metabolic Panel: Recent Labs  Lab 07/16/19 2122 07/17/19 0543  NA 135 134*  K 3.6 3.4*  CL 99 103  CO2 26 26  GLUCOSE 120* 104*  BUN 11 8  CREATININE 0.54 0.56  CALCIUM 8.9 7.4*  MG  --  1.2*  PHOS  --  1.8*   GFR: Estimated Creatinine Clearance: 115.9 mL/min (by C-G formula based on  SCr of 0.56 mg/dL). Liver Function Tests: Recent Labs  Lab 07/16/19 2122 07/17/19 0543  AST 16 13*  ALT 16 13  ALKPHOS 35* 27*  BILITOT 0.5 0.8  PROT 7.4 5.6*  ALBUMIN 3.6 2.6*   Recent Labs  Lab 07/16/19 2122  LIPASE 20   No results for input(s): AMMONIA in the last 168 hours. Coagulation Profile: Recent Labs  Lab 07/17/19 0543  INR 1.2   Cardiac Enzymes: No results for input(s): CKTOTAL, CKMB, CKMBINDEX, TROPONINI in the last 168 hours. BNP (last 3 results) No results for input(s): PROBNP in the last 8760 hours. HbA1C: No results for input(s): HGBA1C in the last 72 hours. CBG: No results for input(s): GLUCAP in the last 168 hours. Lipid Profile: No results for input(s): CHOL, HDL, LDLCALC, TRIG, CHOLHDL, LDLDIRECT in the last 72 hours. Thyroid Function Tests: No results for input(s): TSH, T4TOTAL, FREET4, T3FREE, THYROIDAB in the last 72 hours. Anemia Panel: No  results for input(s): VITAMINB12, FOLATE, FERRITIN, TIBC, IRON, RETICCTPCT in the last 72 hours. Urine analysis:    Component Value Date/Time   COLORURINE YELLOW 07/16/2019 2108   APPEARANCEUR HAZY (A) 07/16/2019 2108   LABSPEC 1.031 (H) 07/16/2019 2108   PHURINE 5.0 07/16/2019 2108   GLUCOSEU NEGATIVE 07/16/2019 2108   GLUCOSEU NEGATIVE 05/14/2015 0858   HGBUR NEGATIVE 07/16/2019 2108   BILIRUBINUR NEGATIVE 07/16/2019 2108   KETONESUR 20 (A) 07/16/2019 2108   PROTEINUR NEGATIVE 07/16/2019 2108   UROBILINOGEN 0.2 05/14/2015 0858   NITRITE NEGATIVE 07/16/2019 2108   LEUKOCYTESUR NEGATIVE 07/16/2019 2108    Radiological Exams on Admission: CT ABDOMEN PELVIS W CONTRAST  Result Date: 07/17/2019 CLINICAL DATA:  Right upper quadrant pain EXAM: CT ABDOMEN AND PELVIS WITH CONTRAST TECHNIQUE: Multidetector CT imaging of the abdomen and pelvis was performed using the standard protocol following bolus administration of intravenous contrast. CONTRAST:  125m OMNIPAQUE IOHEXOL 300 MG/ML  SOLN COMPARISON:  None.  FINDINGS: Lower chest:  Streaky opacity in the lower lobes from atelectasis. Hepatobiliary: No focal liver abnormality.Thick, hyperenhancing gallbladder wall with pericholecystic edema. No visible calcified cholelithiasis. No bile duct dilatation. Pancreas: Unremarkable. Spleen: Unremarkable. Adrenals/Urinary Tract: Negative adrenals. Area of heterogeneous hypoenhancement in the interpolar left kidney without rounded masslike appearance. Contrast in the urinary bladder, question scan at outside institution recently. Stomach/Bowel:  No obstruction. No evidence of bowel inflammation Vascular/Lymphatic: No acute vascular abnormality. No mass or adenopathy. Reproductive:Small posterior intramural fibroid. Other: No ascites or pneumoperitoneum. Musculoskeletal: No acute abnormalities. IMPRESSION: 1. Findings of acute cholecystitis except there are no confirmed/calcified calculi-ultrasound would be contributory. 2. Heterogeneous enhancement in the interpolar left kidney, favor focal pyelonephritis. Please correlate with urinalysis and recommend sonographic follow-up after convalescence. Electronically Signed   By: JMonte FantasiaM.D.   On: 07/17/2019 04:06    EKG: Independently reviewed.  Vent. rate 133 BPM PR interval * ms QRS duration 85 ms QT/QTc 264/393 ms P-R-T axes 77 14 227 Sinus tachycardia  Assessment/Plan Principal Problem:   Acute cholecystitis Admit to stepdown/inpatient. Keep n.p.o. Continue IV fluids. Analgesics as needed. Antiemetics as needed. Famotidine 20 mg IVPB x1 Cefepime per pharmacy. Metronidazole 500 mg IVPB every 8 hours.  Active Problems:   Hypophosphatemia Replacing. Follow-up phosphorus level.    Hypokalemia Replacing. Magnesium has been supplemented. Follow-up potassium level.    Hypomagnesemia Replacing. Follow-up magnesium level.    Depression Continue Effexor 150 mg p.o. daily.    Hypothyroidism Continue levothyroxine 100 mcg p.o. daily. Check TSH  level.    Essential hypertension Hold lisinopril and hydrochlorothiazide for now. Monitor blood pressure, renal function electrolytes.   DVT prophylaxis: SCD. Code Status: Full code. Family Communication: Disposition Plan: Admit for IV antibiotic therapy and surgical evaluation. Consults called: General surgery will evaluate the patient. Admission status: Inpatient/stepdown.   DReubin MilanMD [facility]  If 7PM-7AM, please contact night-coverage [email or web address]  07/17/2019, 6:26 AM   This document was prepared using Dragon voice recognition software and may contain some unintended transcription errors.

## 2019-07-17 NOTE — Progress Notes (Signed)
Dr. Arnoldo Morale paged and made aware that patients HR continues to sustain in the 130-140s, RR-30s. Pt given pain medication @ 2200- and already c/o pain again. Waiting for call back/orders. Will continue to monitor pt

## 2019-07-17 NOTE — H&P (View-Only) (Signed)
Reason for Consult: Cholecystitis Referring Physician: Dr. Felipa Emory Cheryl Hampton is an 34 y.o. female.  HPI: Patient is a 34 year old female who started having right upper quadrant abdominal pain and right flank pain yesterday morning around 3 AM.  She had ongoing nausea and vomiting.  She went to an outside emergency room and a CT scan there revealed cholecystitis.  At the time, she was discharged home.  Due to worsening pain, nausea, and vomiting, she presented to the emergency room at [facility] for further evaluation and treatment.  She states she has never had this type of episode before.  She denies any diarrhea, constipation, melena, hematochezia, or fatty food intolerance.  She currently has a pain level of 8 out of 10.  CT scan of the abdomen revealed cholecystitis, though no stones were seen.  Ultrasound the gallbladder revealed a fatty liver with an edematous gallbladder wall, pericholecystic fluid, and biliary sludge.  The common bile duct was within normal limits.  She was also noted on admission to be tachycardic with an elevated white blood cell count.  She has been receiving fluid boluses and started on IV antibiotics.  She denies any shortness of breath or chest pain.  Past Medical History:  Diagnosis Date  . Depression    Post partum took lexapro and wellbutrin and it helped  . Hypothyroid 2012 dx   post partum hyperthyroid  . Migraine headache     Past Surgical History:  Procedure Laterality Date  . MOUTH SURGERY    . Removal (R) Fallopian tube  09/2009    Family History  Problem Relation Age of Onset  . Alcohol abuse Maternal Uncle   . Pancreatic cancer Maternal Grandmother   . Stroke Maternal Grandfather   . Hypertension Mother   . Diabetes Mother   . Hyperlipidemia Mother   . Thyroid disease Neg Hx     Social History:  reports that she has never smoked. She has never used smokeless tobacco. She reports that she does not drink alcohol or use  drugs.  Allergies:  Allergies  Allergen Reactions  . Gabapentin     Made her feel funny    Medications:  I have reviewed the patient's current medications. Scheduled: . levothyroxine  50 mcg Intravenous Daily    Results for orders placed or performed during the hospital encounter of 07/16/19 (from the past 48 hour(s))  Urinalysis, Routine w reflex microscopic     Status: Abnormal   Collection Time: 07/16/19  9:08 PM  Result Value Ref Range   Color, Urine YELLOW YELLOW   APPearance HAZY (A) CLEAR   Specific Gravity, Urine 1.031 (H) 1.005 - 1.030   pH 5.0 5.0 - 8.0   Glucose, UA NEGATIVE NEGATIVE mg/dL   Hgb urine dipstick NEGATIVE NEGATIVE   Bilirubin Urine NEGATIVE NEGATIVE   Ketones, ur 20 (A) NEGATIVE mg/dL   Protein, ur NEGATIVE NEGATIVE mg/dL   Nitrite NEGATIVE NEGATIVE   Leukocytes,Ua NEGATIVE NEGATIVE    Comment: Performed at [facility], [address], [place], [place] [address]  Pregnancy, urine     Status: None   Collection Time: 07/16/19  9:08 PM  Result Value Ref Range   Preg Test, Ur NEGATIVE NEGATIVE    Comment:        THE SENSITIVITY OF THIS METHODOLOGY IS >20 mIU/mL. Performed at [facility], [address] [place], [place] [address]   CBC     Status: Abnormal   Collection Time: 07/16/19  9:22 PM  Result Value Ref Range   WBC 19.1 (H) 4.0 - 10.5 K/uL   RBC 4.27 3.87 - 5.11 MIL/uL   Hemoglobin 13.0 12.0 - 15.0 g/dL   HCT 40.2 36.0 - 46.0 %   MCV 94.1 80.0 - 100.0 fL   MCH 30.4 26.0 - 34.0 pg   MCHC 32.3 30.0 - 36.0 g/dL   RDW 13.5 11.5 - 15.5 %   Platelets 396 150 - 400 K/uL   nRBC 0.0 0.0 - 0.2 %    Comment: Performed at [facility], [address], [place], [place] [address]  Comprehensive metabolic panel     Status: Abnormal   Collection Time: 07/16/19  9:22 PM  Result Value Ref Range   Sodium 135 135 - 145 mmol/L   Potassium 3.6 3.5 - 5.1 mmol/L   Chloride 99 98 - 111 mmol/L   CO2 26 22 - 32 mmol/L   Glucose, Bld 120 (H) 70 - 99 mg/dL    BUN 11 6 - 20 mg/dL   Creatinine, Ser 0.54 0.44 - 1.00 mg/dL   Calcium 8.9 8.9 - 10.3 mg/dL   Total Protein 7.4 6.5 - 8.1 g/dL   Albumin 3.6 3.5 - 5.0 g/dL   AST 16 15 - 41 U/L   ALT 16 0 - 44 U/L   Alkaline Phosphatase 35 (L) 38 - 126 U/L   Total Bilirubin 0.5 0.3 - 1.2 mg/dL   GFR calc non Af Amer >60 >60 mL/min   GFR calc Af Amer >60 >60 mL/min   Anion gap 10 5 - 15    Comment: Performed at [facility], [address] [place], [place] [address]  Lipase, blood     Status: None   Collection Time: 07/16/19  9:22 PM  Result Value Ref Range   Lipase 20 11 - 51 U/L    Comment: Performed at [facility], [address], [place], [place] [address]  Respiratory Panel by RT PCR (Flu A&B, Covid) - Nasopharyngeal Swab     Status: None   Collection Time: 07/17/19  4:19 AM   Specimen: Nasopharyngeal Swab  Result Value Ref Range   SARS Coronavirus 2 by RT PCR NEGATIVE NEGATIVE    Comment: (NOTE) SARS-CoV-2 target nucleic acids are NOT DETECTED. The SARS-CoV-2 RNA is generally detectable in upper respiratoy specimens during the acute phase of infection. The lowest concentration of SARS-CoV-2 viral copies this assay can detect is 131 copies/mL. A negative result does not preclude SARS-Cov-2 infection and should not be used as the sole basis for treatment or other patient management decisions. A negative result may occur with  improper specimen collection/handling, submission of specimen other than nasopharyngeal swab, presence of viral mutation(s) within the areas targeted by this assay, and inadequate number of viral copies (<131 copies/mL). A negative result must be combined with clinical observations, patient history, and epidemiological information. The expected result is Negative. Fact Sheet for Patients:  PinkCheek.be Fact Sheet for Healthcare Providers:  GravelBags.it This test is not yet ap proved or cleared by the [place] FDA and  has been authorized for detection and/or diagnosis of SARS-CoV-2 by FDA under an Emergency Use Authorization (EUA). This EUA will remain  in effect (meaning this test can be used) for the duration of the COVID-19 declaration under Section 564(b)(1) of the Act, 21 U.S.C. section 360bbb-3(b)(1), unless the authorization is terminated or revoked sooner.    Influenza A by PCR NEGATIVE NEGATIVE   Influenza B by PCR NEGATIVE NEGATIVE  Comment: (NOTE) The Xpert Xpress SARS-CoV-2/FLU/RSV assay is intended as an aid in  the diagnosis of influenza from Nasopharyngeal swab specimens and  should not be used as a sole basis for treatment. Nasal washings and  aspirates are unacceptable for Xpert Xpress SARS-CoV-2/FLU/RSV  testing. Fact Sheet for Patients: PinkCheek.be Fact Sheet for Healthcare Providers: GravelBags.it This test is not yet approved or cleared by the [place] FDA and  has been authorized for detection and/or diagnosis of SARS-CoV-2 by  FDA under an Emergency Use Authorization (EUA). This EUA will remain  in effect (meaning this test can be used) for the duration of the  Covid-19 declaration under Section 564(b)(1) of the Act, 21  U.S.C. section 360bbb-3(b)(1), unless the authorization is  terminated or revoked. Performed at [facility], [address], [place], [place] [address]   CBC with Differential/Platelet     Status: Abnormal   Collection Time: 07/17/19  5:43 AM  Result Value Ref Range   WBC 23.3 (H) 4.0 - 10.5 K/uL   RBC 3.55 (L) 3.87 - 5.11 MIL/uL   Hemoglobin 10.8 (L) 12.0 - 15.0 g/dL   HCT 34.3 (L) 36.0 - 46.0 %   MCV 96.6 80.0 - 100.0 fL   MCH 30.4 26.0 - 34.0 pg   MCHC 31.5 30.0 - 36.0 g/dL   RDW 13.7 11.5 - 15.5 %   Platelets 343 150 - 400 K/uL   nRBC 0.0 0.0 - 0.2 %   Neutrophils Relative % 87 %   Neutro Abs 20.3 (H) 1.7 - 7.7 K/uL   Lymphocytes Relative 5 %   Lymphs Abs 1.1 0.7  - 4.0 K/uL   Monocytes Relative 7 %   Monocytes Absolute 1.6 (H) 0.1 - 1.0 K/uL   Eosinophils Relative 0 %   Eosinophils Absolute 0.0 0.0 - 0.5 K/uL   Basophils Relative 0 %   Basophils Absolute 0.1 0.0 - 0.1 K/uL   Immature Granulocytes 1 %   Abs Immature Granulocytes 0.23 (H) 0.00 - 0.07 K/uL    Comment: Performed at [facility], [address], [place], [place] [address]  Comprehensive metabolic panel     Status: Abnormal   Collection Time: 07/17/19  5:43 AM  Result Value Ref Range   Sodium 134 (L) 135 - 145 mmol/L   Potassium 3.4 (L) 3.5 - 5.1 mmol/L   Chloride 103 98 - 111 mmol/L   CO2 26 22 - 32 mmol/L   Glucose, Bld 104 (H) 70 - 99 mg/dL   BUN 8 6 - 20 mg/dL   Creatinine, Ser 0.56 0.44 - 1.00 mg/dL   Calcium 7.4 (L) 8.9 - 10.3 mg/dL   Total Protein 5.6 (L) 6.5 - 8.1 g/dL   Albumin 2.6 (L) 3.5 - 5.0 g/dL   AST 13 (L) 15 - 41 U/L   ALT 13 0 - 44 U/L   Alkaline Phosphatase 27 (L) 38 - 126 U/L   Total Bilirubin 0.8 0.3 - 1.2 mg/dL   GFR calc non Af Amer >60 >60 mL/min   GFR calc Af Amer >60 >60 mL/min   Anion gap 5 5 - 15    Comment: Performed at [facility], [address] [place], [place] [address]  Magnesium     Status: Abnormal   Collection Time: 07/17/19  5:43 AM  Result Value Ref Range   Magnesium 1.2 (L) 1.7 - 2.4 mg/dL    Comment: Performed at [facility], [address] [place], [place] [address]  Phosphorus  Status: Abnormal   Collection Time: 07/17/19  5:43 AM  Result Value Ref Range   Phosphorus 1.8 (L) 2.5 - 4.6 mg/dL    Comment: Performed at [facility], [address] [place], [place] [address]  Lactic acid, plasma     Status: None   Collection Time: 07/17/19  5:43 AM  Result Value Ref Range   Lactic Acid, Venous 1.8 0.5 - 1.9 mmol/L    Comment: Performed at [facility], [address], [place], [place] [address]  Protime-INR     Status: None   Collection Time: 07/17/19  5:43 AM  Result Value Ref Range   Prothrombin Time 14.6 11.4 - 15.2  seconds   INR 1.2 0.8 - 1.2    Comment: (NOTE) INR goal varies based on device and disease states. Performed at [facility], [address] [place], [place] [address]   APTT     Status: None   Collection Time: 07/17/19  5:43 AM  Result Value Ref Range   aPTT 30 24 - 36 seconds    Comment: Performed at [facility], [address], [place],  [address]    CT ABDOMEN PELVIS W CONTRAST  Result Date: 07/17/2019 CLINICAL DATA:  Right upper quadrant pain EXAM: CT ABDOMEN AND PELVIS WITH CONTRAST TECHNIQUE: Multidetector CT imaging of the abdomen and pelvis was performed using the standard protocol following bolus administration of intravenous contrast. CONTRAST:  132mL OMNIPAQUE IOHEXOL 300 MG/ML  SOLN COMPARISON:  None. FINDINGS: Lower chest:  Streaky opacity in the lower lobes from atelectasis. Hepatobiliary: No focal liver abnormality.Thick, hyperenhancing gallbladder wall with pericholecystic edema. No visible calcified cholelithiasis. No bile duct dilatation. Pancreas: Unremarkable. Spleen: Unremarkable. Adrenals/Urinary Tract: Negative adrenals. Area of heterogeneous hypoenhancement in the interpolar left kidney without rounded masslike appearance. Contrast in the urinary bladder, question scan at outside institution recently. Stomach/Bowel:  No obstruction. No evidence of bowel inflammation Vascular/Lymphatic: No acute vascular abnormality. No mass or adenopathy. Reproductive:Small posterior intramural fibroid. Other: No ascites or pneumoperitoneum. Musculoskeletal: No acute abnormalities. IMPRESSION: 1. Findings of acute cholecystitis except there are no confirmed/calcified calculi-ultrasound would be contributory. 2. Heterogeneous enhancement in the interpolar left kidney, favor focal pyelonephritis. Please correlate with urinalysis and recommend sonographic follow-up after convalescence. Electronically Signed   By: Monte Fantasia M.D.   On: 07/17/2019 04:06   US Abdomen Limited  RUQ  Result Date: 07/17/2019 CLINICAL DATA:  Severe right upper quadrant abdominal pain EXAM: ULTRASOUND ABDOMEN LIMITED RIGHT UPPER QUADRANT COMPARISON:  CT 07/17/2019 FINDINGS: Gallbladder: Diffuse circumferential gallbladder wall thickening of approximately 8 mm. Layering hyperdense, nonshadowing material within the gallbladder lumen suggesting biliary sludge. No discrete shadowing stone. A positive sonographic Murphy sign noted by sonographer. Common bile duct: Diameter: 5.4 mm Liver: No focal lesion identified. Coarsened echotexture with diffusely increased hepatic parenchymal echogenicity. Portal vein is patent on color Doppler imaging with normal direction of blood flow towards the liver. Other: None. IMPRESSION: 1. Diffuse gallbladder wall thickening with layering biliary sludge and a positive sonographic Murphy sign. Findings are suggestive of acute cholecystitis. 2. Heterogeneously echogenic appearance of the hepatic parenchyma, nonspecific, but can be seen in the setting of fatty infiltration as well as hepatitis. Correlate with serum enzymes. Electronically Signed   By: Davina Poke D.O.   On: 07/17/2019 08:34    ROS:  Pertinent items are noted in HPI.  Blood pressure 108/66, pulse (!) 117, temperature 98.2 F (36.8 C), temperature source Oral, resp. rate (!) 25, height 5\' 4"  (1.626 m), weight  99.8 kg, last menstrual period 06/23/2019, SpO2 96 %, unknown if currently breastfeeding. Physical Exam: Pleasant fatigued white female no acute distress Head is normocephalic, atraumatic Eyes without scleral icterus Lungs clear to auscultation with good breath sounds bilaterally Heart examination reveals a tachycardic rhythm which is regular.  No S3, S4, murmurs identified. Abdomen soft with tenderness in the right upper quadrant and right flank regions.  No rigidity is noted.  Hepatosplenomegaly could not be assessed due to body habitus.  Ultrasound CT scan reports  reviewed  Assessment/Plan: Impression: Acute cholecystitis with biliary sludge I suspect her tachycardia is related to her pain and dehydration from nausea and vomiting.  She also has hypomagnesemia and hypophosphatemia. Plan: We will transfer patient to the surgical service.  Patient is already receiving magnesium supplementation.  Will consult pharmacy concerning hypophosphatemia.  Continue fluid resuscitation and IV antibiotics.  The plan is to resuscitate the patient and to proceed with a laparoscopic cholecystectomy in the morning.  The risks and benefits of the procedure including bleeding, infection, hepatobiliary injury, the possibility of an open procedure were fully explained to the patient, who gave informed consent.  Aviva Signs 07/17/2019, 9:25 AM

## 2019-07-17 NOTE — Progress Notes (Signed)
Dr. Arnoldo Morale made aware of pts HR sustaining in 130s and RR-36-40. New order for Ativan q4h to be given, NS bolus ordered and hung. Consent signed and placed in chart and pt made aware of NPO status. Lovenox held until am after surgery, pt does have on SCDs.  Will continue to monitor pt

## 2019-07-17 NOTE — Progress Notes (Signed)
This RN spoke with Dr. Olevia Bowens after speaking with Dr. Arnoldo Morale. Dr. Olevia Bowens increased fentanyl to 162mcg q2h, STAT labs ordered and LR bolus. Will continue to monitor pt

## 2019-07-18 ENCOUNTER — Inpatient Hospital Stay (HOSPITAL_COMMUNITY): Payer: 59 | Admitting: Anesthesiology

## 2019-07-18 ENCOUNTER — Encounter (HOSPITAL_COMMUNITY)
Admission: EM | Disposition: A | Discharge status: Home / Self Care | Payer: Self-pay | Source: Home / Self Care | Attending: General Surgery

## 2019-07-18 ENCOUNTER — Encounter (HOSPITAL_COMMUNITY): Payer: Self-pay | Admitting: General Surgery

## 2019-07-18 DIAGNOSIS — Z79899 Other long term (current) drug therapy: Secondary | ICD-10-CM | POA: Diagnosis not present

## 2019-07-18 DIAGNOSIS — Z833 Family history of diabetes mellitus: Secondary | ICD-10-CM | POA: Diagnosis not present

## 2019-07-18 DIAGNOSIS — Z20822 Contact with and (suspected) exposure to covid-19: Secondary | ICD-10-CM | POA: Diagnosis not present

## 2019-07-18 DIAGNOSIS — K8000 Calculus of gallbladder with acute cholecystitis without obstruction: Secondary | ICD-10-CM | POA: Diagnosis not present

## 2019-07-18 DIAGNOSIS — K82A1 Gangrene of gallbladder in cholecystitis: Secondary | ICD-10-CM | POA: Diagnosis not present

## 2019-07-18 DIAGNOSIS — K810 Acute cholecystitis: Secondary | ICD-10-CM

## 2019-07-18 DIAGNOSIS — Z6841 Body Mass Index (BMI) 40.0 and over, adult: Secondary | ICD-10-CM | POA: Diagnosis not present

## 2019-07-18 DIAGNOSIS — Z79890 Hormone replacement therapy (postmenopausal): Secondary | ICD-10-CM | POA: Diagnosis not present

## 2019-07-18 DIAGNOSIS — E039 Hypothyroidism, unspecified: Secondary | ICD-10-CM | POA: Diagnosis not present

## 2019-07-18 HISTORY — PX: CHOLECYSTECTOMY: SHX55

## 2019-07-18 LAB — COMPREHENSIVE METABOLIC PANEL
ALT: 11 U/L (ref 0–44)
ALT: 12 U/L (ref 0–44)
AST: 11 U/L — ABNORMAL LOW (ref 15–41)
AST: 13 U/L — ABNORMAL LOW (ref 15–41)
Albumin: 2.5 g/dL — ABNORMAL LOW (ref 3.5–5.0)
Albumin: 2.6 g/dL — ABNORMAL LOW (ref 3.5–5.0)
Alkaline Phosphatase: 34 U/L — ABNORMAL LOW (ref 38–126)
Alkaline Phosphatase: 40 U/L (ref 38–126)
Anion gap: 6 (ref 5–15)
Anion gap: 6 (ref 5–15)
BUN: 5 mg/dL — ABNORMAL LOW (ref 6–20)
BUN: <5 mg/dL — ABNORMAL LOW (ref 6–20)
CO2: 22 mmol/L (ref 22–32)
CO2: 23 mmol/L (ref 22–32)
Calcium: 7 mg/dL — ABNORMAL LOW (ref 8.9–10.3)
Calcium: 7.2 mg/dL — ABNORMAL LOW (ref 8.9–10.3)
Chloride: 105 mmol/L (ref 98–111)
Chloride: 107 mmol/L (ref 98–111)
Creatinine, Ser: 0.42 mg/dL — ABNORMAL LOW (ref 0.44–1.00)
Creatinine, Ser: 0.47 mg/dL (ref 0.44–1.00)
GFR calc Af Amer: >60 mL/min (ref >60)
GFR calc Af Amer: >60 mL/min (ref >60)
GFR calc non Af Amer: >60 mL/min (ref >60)
GFR calc non Af Amer: >60 mL/min (ref >60)
Glucose, Bld: 103 mg/dL — ABNORMAL HIGH (ref 70–99)
Glucose, Bld: 113 mg/dL — ABNORMAL HIGH (ref 70–99)
Potassium: 3.2 mmol/L — ABNORMAL LOW (ref 3.5–5.1)
Potassium: 3.4 mmol/L — ABNORMAL LOW (ref 3.5–5.1)
Sodium: 133 mmol/L — ABNORMAL LOW (ref 135–145)
Sodium: 136 mmol/L (ref 135–145)
Total Bilirubin: 0.6 mg/dL (ref 0.3–1.2)
Total Bilirubin: 0.7 mg/dL (ref 0.3–1.2)
Total Protein: 5.6 g/dL — ABNORMAL LOW (ref 6.5–8.1)
Total Protein: 5.9 g/dL — ABNORMAL LOW (ref 6.5–8.1)

## 2019-07-18 LAB — CBC WITH DIFFERENTIAL/PLATELET
Abs Immature Granulocytes: 0.22 10*3/uL — ABNORMAL HIGH (ref 0.00–0.07)
Abs Immature Granulocytes: 0.26 10*3/uL — ABNORMAL HIGH (ref 0.00–0.07)
Basophils Absolute: 0 10*3/uL (ref 0.0–0.1)
Basophils Absolute: 0.1 10*3/uL (ref 0.0–0.1)
Basophils Relative: 0 %
Basophils Relative: 0 %
Eosinophils Absolute: 0 10*3/uL (ref 0.0–0.5)
Eosinophils Absolute: 0.1 10*3/uL (ref 0.0–0.5)
Eosinophils Relative: 0 %
Eosinophils Relative: 0 %
HCT: 33.1 % — ABNORMAL LOW (ref 36.0–46.0)
HCT: 35 % — ABNORMAL LOW (ref 36.0–46.0)
Hemoglobin: 10.4 g/dL — ABNORMAL LOW (ref 12.0–15.0)
Hemoglobin: 10.9 g/dL — ABNORMAL LOW (ref 12.0–15.0)
Immature Granulocytes: 1 %
Immature Granulocytes: 2 %
Lymphocytes Relative: 6 %
Lymphocytes Relative: 9 %
Lymphs Abs: 1.3 10*3/uL (ref 0.7–4.0)
Lymphs Abs: 1.6 10*3/uL (ref 0.7–4.0)
MCH: 30.5 pg (ref 26.0–34.0)
MCH: 30.7 pg (ref 26.0–34.0)
MCHC: 31.1 g/dL (ref 30.0–36.0)
MCHC: 31.4 g/dL (ref 30.0–36.0)
MCV: 97.1 fL (ref 80.0–100.0)
MCV: 98.6 fL (ref 80.0–100.0)
Monocytes Absolute: 0.8 10*3/uL (ref 0.1–1.0)
Monocytes Absolute: 1 10*3/uL (ref 0.1–1.0)
Monocytes Relative: 5 %
Monocytes Relative: 5 %
Neutro Abs: 14.7 10*3/uL — ABNORMAL HIGH (ref 1.7–7.7)
Neutro Abs: 17.8 10*3/uL — ABNORMAL HIGH (ref 1.7–7.7)
Neutrophils Relative %: 84 %
Neutrophils Relative %: 88 %
Platelets: 310 10*3/uL (ref 150–400)
Platelets: 323 10*3/uL (ref 150–400)
RBC: 3.41 MIL/uL — ABNORMAL LOW (ref 3.87–5.11)
RBC: 3.55 MIL/uL — ABNORMAL LOW (ref 3.87–5.11)
RDW: 14.3 % (ref 11.5–15.5)
RDW: 14.4 % (ref 11.5–15.5)
WBC: 17.4 10*3/uL — ABNORMAL HIGH (ref 4.0–10.5)
WBC: 20.4 10*3/uL — ABNORMAL HIGH (ref 4.0–10.5)
nRBC: 0 % (ref 0.0–0.2)
nRBC: 0 % (ref 0.0–0.2)

## 2019-07-18 LAB — LACTIC ACID, PLASMA: Lactic Acid, Venous: 0.9 mmol/L (ref 0.5–1.9)

## 2019-07-18 LAB — PHOSPHORUS: Phosphorus: 1 mg/dL — CL (ref 2.5–4.6)

## 2019-07-18 LAB — MAGNESIUM
Magnesium: 2.3 mg/dL (ref 1.7–2.4)
Magnesium: 2.4 mg/dL (ref 1.7–2.4)

## 2019-07-18 LAB — TSH: TSH: 0.06 u[IU]/mL — ABNORMAL LOW (ref 0.350–4.500)

## 2019-07-18 LAB — BRAIN NATRIURETIC PEPTIDE: B Natriuretic Peptide: 157 pg/mL — ABNORMAL HIGH (ref 0.0–100.0)

## 2019-07-18 SURGERY — LAPAROSCOPIC CHOLECYSTECTOMY
Anesthesia: General | Site: Abdomen

## 2019-07-18 MED ORDER — BUPIVACAINE LIPOSOME 1.3 % IJ SUSP
INTRAMUSCULAR | Status: AC
  Filled 2019-07-18: qty 20

## 2019-07-18 MED ORDER — PROPOFOL 10 MG/ML IV BOLUS
INTRAVENOUS | Status: DC | PRN
  Administered 2019-07-18: 200 mg via INTRAVENOUS

## 2019-07-18 MED ORDER — FENTANYL CITRATE (PF) 100 MCG/2ML IJ SOLN
25.0000 ug | Freq: Once | INTRAMUSCULAR | Status: AC
  Administered 2019-07-18: 50 ug via INTRAVENOUS

## 2019-07-18 MED ORDER — HEMOSTATIC AGENTS (NO CHARGE) OPTIME
TOPICAL | Status: DC | PRN
  Administered 2019-07-18 (×2): 1 via TOPICAL

## 2019-07-18 MED ORDER — FENTANYL CITRATE (PF) 100 MCG/2ML IJ SOLN
INTRAMUSCULAR | Status: AC
  Filled 2019-07-18: qty 2

## 2019-07-18 MED ORDER — ENOXAPARIN SODIUM 40 MG/0.4ML ~~LOC~~ SOLN: 40.0000 mg | SUBCUTANEOUS | Status: DC

## 2019-07-18 MED ORDER — LIDOCAINE 2% (20 MG/ML) 5 ML SYRINGE
INTRAMUSCULAR | Status: AC
  Filled 2019-07-18: qty 5

## 2019-07-18 MED ORDER — DIPHENHYDRAMINE HCL 50 MG/ML IJ SOLN: 25.0000 mg | Freq: Four times a day (QID) | INTRAMUSCULAR | Status: DC | PRN

## 2019-07-18 MED ORDER — METOPROLOL TARTRATE 5 MG/5ML IV SOLN
5.0000 mg | Freq: Once | INTRAVENOUS | Status: AC
  Administered 2019-07-18: 5 mg via INTRAVENOUS
  Filled 2019-07-18: qty 5

## 2019-07-18 MED ORDER — ONDANSETRON HCL 4 MG/2ML IJ SOLN
4.0000 mg | Freq: Four times a day (QID) | INTRAMUSCULAR | Status: DC | PRN
  Administered 2019-07-20: 4 mg via INTRAVENOUS
  Filled 2019-07-18: qty 2

## 2019-07-18 MED ORDER — SODIUM CHLORIDE 0.9 % IR SOLN
Status: DC | PRN
  Administered 2019-07-18: 3000 mL
  Administered 2019-07-18: 1000 mL

## 2019-07-18 MED ORDER — OXYCODONE-ACETAMINOPHEN 5-325 MG PO TABS
1.0000 | ORAL_TABLET | ORAL | Status: DC | PRN
  Administered 2019-07-18 (×2): 1 via ORAL
  Administered 2019-07-18: 2 via ORAL
  Administered 2019-07-19: 1 via ORAL
  Administered 2019-07-19: 2 via ORAL
  Administered 2019-07-19: 1 via ORAL
  Administered 2019-07-20 – 2019-07-21 (×3): 2 via ORAL
  Filled 2019-07-18: qty 2
  Filled 2019-07-18: qty 1
  Filled 2019-07-18: qty 2
  Filled 2019-07-18: qty 1
  Filled 2019-07-18 (×4): qty 2
  Filled 2019-07-18: qty 1

## 2019-07-18 MED ORDER — ROCURONIUM 10MG/ML (10ML) SYRINGE FOR MEDFUSION PUMP - OPTIME
INTRAVENOUS | Status: DC | PRN
  Administered 2019-07-18: 30 mg via INTRAVENOUS
  Administered 2019-07-18: 5 mg via INTRAVENOUS

## 2019-07-18 MED ORDER — HYDROMORPHONE HCL 1 MG/ML IJ SOLN: 0.2500 mg | INTRAMUSCULAR | Status: DC | PRN

## 2019-07-18 MED ORDER — SIMETHICONE 80 MG PO CHEW
40.0000 mg | CHEWABLE_TABLET | Freq: Four times a day (QID) | ORAL | Status: DC | PRN
  Administered 2019-07-19 – 2019-07-21 (×2): 40 mg via ORAL
  Filled 2019-07-18 (×3): qty 1

## 2019-07-18 MED ORDER — POTASSIUM PHOSPHATES 15 MMOLE/5ML IV SOLN
30.0000 mmol | Freq: Once | INTRAVENOUS | Status: AC
  Administered 2019-07-18: 30 mmol via INTRAVENOUS
  Filled 2019-07-18: qty 10

## 2019-07-18 MED ORDER — FENTANYL CITRATE (PF) 100 MCG/2ML IJ SOLN
INTRAMUSCULAR | Status: DC | PRN
  Administered 2019-07-18: 25 ug via INTRAVENOUS
  Administered 2019-07-18: 50 ug via INTRAVENOUS
  Administered 2019-07-18: 100 ug via INTRAVENOUS
  Administered 2019-07-18: 50 ug via INTRAVENOUS

## 2019-07-18 MED ORDER — ROCURONIUM BROMIDE 10 MG/ML (PF) SYRINGE
PREFILLED_SYRINGE | INTRAVENOUS | Status: AC
  Filled 2019-07-18: qty 10

## 2019-07-18 MED ORDER — LACTATED RINGERS IV SOLN: INTRAVENOUS | Status: DC

## 2019-07-18 MED ORDER — POTASSIUM CHLORIDE 10 MEQ/100ML IV SOLN
10.0000 meq | INTRAVENOUS | Status: AC
  Administered 2019-07-18 (×4): 10 meq via INTRAVENOUS
  Filled 2019-07-18 (×4): qty 100

## 2019-07-18 MED ORDER — BUPIVACAINE LIPOSOME 1.3 % IJ SUSP
INTRAMUSCULAR | Status: DC | PRN
  Administered 2019-07-18: 20 mL

## 2019-07-18 MED ORDER — MORPHINE SULFATE (PF) 4 MG/ML IV SOLN
4.0000 mg | INTRAVENOUS | Status: DC | PRN
  Administered 2019-07-18 – 2019-07-20 (×6): 4 mg via INTRAVENOUS
  Filled 2019-07-18 (×6): qty 1

## 2019-07-18 MED ORDER — ONDANSETRON HCL 4 MG/2ML IJ SOLN
INTRAMUSCULAR | Status: DC | PRN
  Administered 2019-07-18: 4 mg via INTRAVENOUS

## 2019-07-18 MED ORDER — KETOROLAC TROMETHAMINE 30 MG/ML IJ SOLN
30.0000 mg | Freq: Four times a day (QID) | INTRAMUSCULAR | Status: AC
  Administered 2019-07-18: 30 mg via INTRAVENOUS
  Filled 2019-07-18: qty 1

## 2019-07-18 MED ORDER — SUGAMMADEX SODIUM 500 MG/5ML IV SOLN
INTRAVENOUS | Status: AC
  Filled 2019-07-18: qty 5

## 2019-07-18 MED ORDER — SUCCINYLCHOLINE 20MG/ML (10ML) SYRINGE FOR MEDFUSION PUMP - OPTIME
INTRAMUSCULAR | Status: DC | PRN
  Administered 2019-07-18: 140 mg via INTRAVENOUS

## 2019-07-18 MED ORDER — FENTANYL CITRATE (PF) 250 MCG/5ML IJ SOLN
INTRAMUSCULAR | Status: AC
  Filled 2019-07-18: qty 5

## 2019-07-18 MED ORDER — HYDROCODONE-ACETAMINOPHEN 7.5-325 MG PO TABS: 1.0000 | ORAL_TABLET | Freq: Once | ORAL | Status: DC | PRN

## 2019-07-18 MED ORDER — MIDAZOLAM HCL 2 MG/2ML IJ SOLN: 0.5000 mg | Freq: Once | INTRAMUSCULAR | Status: DC | PRN

## 2019-07-18 MED ORDER — ONDANSETRON HCL 4 MG/2ML IJ SOLN
INTRAMUSCULAR | Status: AC
  Filled 2019-07-18: qty 2

## 2019-07-18 MED ORDER — FENTANYL CITRATE (PF) 100 MCG/2ML IJ SOLN
50.0000 ug | Freq: Once | INTRAMUSCULAR | Status: AC
  Administered 2019-07-18: 50 ug via INTRAVENOUS

## 2019-07-18 MED ORDER — SUCCINYLCHOLINE CHLORIDE 200 MG/10ML IV SOSY
PREFILLED_SYRINGE | INTRAVENOUS | Status: AC
  Filled 2019-07-18: qty 10

## 2019-07-18 MED ORDER — SUGAMMADEX SODIUM 200 MG/2ML IV SOLN
INTRAVENOUS | Status: DC | PRN
  Administered 2019-07-18: 200 mg via INTRAVENOUS

## 2019-07-18 MED ORDER — KETOROLAC TROMETHAMINE 30 MG/ML IJ SOLN
30.0000 mg | Freq: Once | INTRAMUSCULAR | Status: AC
  Administered 2019-07-18: 30 mg via INTRAVENOUS
  Filled 2019-07-18: qty 1

## 2019-07-18 MED ORDER — ENOXAPARIN SODIUM 60 MG/0.6ML ~~LOC~~ SOLN
55.0000 mg | SUBCUTANEOUS | Status: DC
  Administered 2019-07-19 – 2019-07-21 (×3): 55 mg via SUBCUTANEOUS
  Filled 2019-07-18 (×4): qty 0.6

## 2019-07-18 MED ORDER — DIPHENHYDRAMINE HCL 25 MG PO CAPS: 25.0000 mg | ORAL_CAPSULE | Freq: Four times a day (QID) | ORAL | Status: DC | PRN

## 2019-07-18 MED ORDER — KETOROLAC TROMETHAMINE 30 MG/ML IJ SOLN
30.0000 mg | Freq: Four times a day (QID) | INTRAMUSCULAR | Status: DC | PRN
  Administered 2019-07-18 – 2019-07-19 (×4): 30 mg via INTRAVENOUS
  Filled 2019-07-18 (×4): qty 1

## 2019-07-18 MED ORDER — CHLORHEXIDINE GLUCONATE CLOTH 2 % EX PADS
6.0000 | MEDICATED_PAD | Freq: Every day | CUTANEOUS | Status: DC
  Administered 2019-07-18 – 2019-07-19 (×2): 6 via TOPICAL

## 2019-07-18 MED ORDER — ONDANSETRON 4 MG PO TBDP
4.0000 mg | ORAL_TABLET | Freq: Four times a day (QID) | ORAL | Status: DC | PRN
  Administered 2019-07-20: 4 mg via ORAL
  Filled 2019-07-18: qty 1

## 2019-07-18 MED ORDER — PROPOFOL 10 MG/ML IV BOLUS
INTRAVENOUS | Status: AC
  Filled 2019-07-18: qty 20

## 2019-07-18 MED ORDER — FAMOTIDINE IN NACL 20-0.9 MG/50ML-% IV SOLN
20.0000 mg | Freq: Two times a day (BID) | INTRAVENOUS | Status: DC
  Administered 2019-07-18 – 2019-07-19 (×3): 20 mg via INTRAVENOUS
  Filled 2019-07-18 (×3): qty 50

## 2019-07-18 MED ORDER — LIDOCAINE HCL (CARDIAC) PF 50 MG/5ML IV SOSY
PREFILLED_SYRINGE | INTRAVENOUS | Status: DC | PRN
  Administered 2019-07-18: 60 mg via INTRAVENOUS

## 2019-07-18 MED ORDER — MIDAZOLAM HCL 5 MG/5ML IJ SOLN
INTRAMUSCULAR | Status: DC | PRN
  Administered 2019-07-18: 1 mg via INTRAVENOUS

## 2019-07-18 MED ORDER — MORPHINE SULFATE (PF) 4 MG/ML IV SOLN
4.0000 mg | Freq: Once | INTRAVENOUS | Status: AC
  Administered 2019-07-18: 4 mg via INTRAVENOUS
  Filled 2019-07-18: qty 1

## 2019-07-18 MED ORDER — MIDAZOLAM HCL 2 MG/2ML IJ SOLN
INTRAMUSCULAR | Status: AC
  Filled 2019-07-18: qty 2

## 2019-07-18 SURGICAL SUPPLY — 49 items
APPLICATOR ARISTA FLEXITIP XL (MISCELLANEOUS) ×2 IMPLANT
APPLIER CLIP ROT 10 11.4 M/L (STAPLE) ×3
BAG RETRIEVAL 10 (BASKET) ×1
BAG RETRIEVAL 10MM (BASKET) ×1
CHLORAPREP W/TINT 26 (MISCELLANEOUS) ×3 IMPLANT
CLIP APPLIE ROT 10 11.4 M/L (STAPLE) ×1 IMPLANT
CLOTH BEACON ORANGE TIMEOUT ST (SAFETY) ×3 IMPLANT
COVER LIGHT HANDLE STERIS (MISCELLANEOUS) ×6 IMPLANT
COVER WAND RF STERILE (DRAPES) ×6 IMPLANT
CUTTER FLEX LINEAR 45M (STAPLE) ×2 IMPLANT
DERMABOND ADVANCED (GAUZE/BANDAGES/DRESSINGS) ×2
DERMABOND ADVANCED .7 DNX12 (GAUZE/BANDAGES/DRESSINGS) ×1 IMPLANT
ELECT REM PT RETURN 9FT ADLT (ELECTROSURGICAL) ×3
ELECTRODE REM PT RTRN 9FT ADLT (ELECTROSURGICAL) ×1 IMPLANT
GLOVE BIOGEL PI IND STRL 7.0 (GLOVE) ×2 IMPLANT
GLOVE BIOGEL PI INDICATOR 7.0 (GLOVE) ×4
GLOVE ECLIPSE 6.5 STRL STRAW (GLOVE) ×4 IMPLANT
GLOVE SURG SS PI 7.5 STRL IVOR (GLOVE) ×3 IMPLANT
GOWN STRL REUS W/TWL LRG LVL3 (GOWN DISPOSABLE) ×9 IMPLANT
HEMOSTAT ARISTA ABSORB 3G PWDR (HEMOSTASIS) ×2 IMPLANT
HEMOSTAT SNOW SURGICEL 2X4 (HEMOSTASIS) ×3 IMPLANT
INST SET LAPROSCOPIC AP (KITS) ×3 IMPLANT
KIT TURNOVER KIT A (KITS) ×3 IMPLANT
MANIFOLD NEPTUNE II (INSTRUMENTS) ×3 IMPLANT
NDL HYPO 18GX1.5 BLUNT FILL (NEEDLE) ×1 IMPLANT
NDL INSUFFLATION 14GA 120MM (NEEDLE) ×1 IMPLANT
NEEDLE HYPO 18GX1.5 BLUNT FILL (NEEDLE) ×3 IMPLANT
NEEDLE HYPO 22GX1.5 SAFETY (NEEDLE) ×3 IMPLANT
NEEDLE INSUFFLATION 14GA 120MM (NEEDLE) ×3 IMPLANT
NS IRRIG 1000ML POUR BTL (IV SOLUTION) ×3 IMPLANT
PACK LAP CHOLE LZT030E (CUSTOM PROCEDURE TRAY) ×3 IMPLANT
PAD ARMBOARD 7.5X6 YLW CONV (MISCELLANEOUS) ×3 IMPLANT
RELOAD 45 VASCULAR/THIN (ENDOMECHANICALS) ×3 IMPLANT
RELOAD STAPLE 45 2.5 WHT GRN (ENDOMECHANICALS) IMPLANT
SET BASIN LINEN APH (SET/KITS/TRAYS/PACK) ×3 IMPLANT
SET TUBE IRRIG SUCTION NO TIP (IRRIGATION / IRRIGATOR) ×2 IMPLANT
SET TUBE SMOKE EVAC HIGH FLOW (TUBING) ×3 IMPLANT
SLEEVE ENDOPATH XCEL 5M (ENDOMECHANICALS) ×3 IMPLANT
SUT MNCRL AB 4-0 PS2 18 (SUTURE) ×8 IMPLANT
SUT VICRYL 0 UR6 27IN ABS (SUTURE) ×3 IMPLANT
SYR 20ML LL LF (SYRINGE) ×6 IMPLANT
SYS BAG RETRIEVAL 10MM (BASKET) ×1
SYSTEM BAG RETRIEVAL 10MM (BASKET) ×1 IMPLANT
TROCAR ENDO BLADELESS 11MM (ENDOMECHANICALS) ×3 IMPLANT
TROCAR XCEL NON-BLD 5MMX100MML (ENDOMECHANICALS) ×3 IMPLANT
TROCAR XCEL UNIV SLVE 11M 100M (ENDOMECHANICALS) ×3 IMPLANT
TUBE CONNECTING 12'X1/4 (SUCTIONS) ×1
TUBE CONNECTING 12X1/4 (SUCTIONS) ×2 IMPLANT
WARMER LAPAROSCOPE (MISCELLANEOUS) ×3 IMPLANT

## 2019-07-18 NOTE — OR Nursing (Signed)
Contacts removed,  Earring removed to right ear.  Placed in PACU.

## 2019-07-18 NOTE — Op Note (Signed)
Patient:  Cheryl Hampton  DOB:  12/21/1985  MRN:  [id number]   Preop Diagnosis: Acute cholecystitis, cholelithiasis  Postop Diagnosis: Empyema of gallbladder with gangrene  Procedure: Laparoscopic cholecystectomy  Surgeon: Aviva Signs, MD  Anes: General endotracheal  Indications: Patient is a 34 year old female who presents with acute cholecystitis secondary to cholelithiasis.  The risks and benefits of the procedure including bleeding, infection, hepatobiliary injury, and the possibility of an open procedure were fully explained to the patient, who gave informed consent.  Procedure note: The patient was placed in the supine position.  After induction of general endotracheal anesthesia, the abdomen was prepped and draped using the usual sterile technique with ChloraPrep.  Surgical site confirmation was performed.  A supraumbilical incision was made down to the fascia.  A Veress needle was introduced into the abdominal cavity and confirmation of placement was done using the saline drop test.  The abdomen was then insufflated to 15 mmHg pressure.  An 11 mm trocar was introduced into the abdominal cavity under direct visualization without difficulty.  The patient was placed in reverse Trendelenburg position and an additional 1 mm trocar was placed in the epigastric region and 5 mm trochars were placed in the right upper quadrant and right flank regions.  Liver was inspected noted to be within normal limits.  The gallbladder was noted to be distended and tense with evidence of a gangrenous gallbladder wall.  Decompression of the gallbladder using suction at the fundus revealed an empyema.  The gallbladder was then retracted in a dynamic fashion in order to provide a critical view of the triangle of Coelho.  The triangle was fully identified.  Endoclips were placed proximally and distally on the cystic artery, and the cystic artery was divided.  Given the swollen nature of the wall of the cystic  duct, I elected to proceed with closure using a vascular Endo GIA.  The gallbladder was then removed from the gallbladder fossa using Bovie electrocautery and blunt dissection.  The gallbladder was delivered through the epigastric trocar site using an Endo Catch bag.  The gallbladder fossa was inspected and any bleeding was controlled using Bovie electrocautery.  Arista and Surgicel snow were placed in the gallbladder fossa.  All fluid and air were then evacuated from the abdominal cavity prior to removal of the trochars.  All wounds were irrigated with normal saline.  All wounds were injected with Exparel.  The epigastric fascia was reapproximated using an 0 Vicryl interrupted suture.  All skin incisions were closed using a 4-0 Monocryl subcuticular suture.  Dermabond was applied.  All tape and needle counts were correct at the end of the procedure.  The patient was extubated and transferred to PACU in stable condition.  Complications:  None   EBL:  75cc  Specimen:  gallbladder

## 2019-07-18 NOTE — Anesthesia Procedure Notes (Signed)
Procedure Name: Intubation Date/Time: 07/18/2019 11:23 AM Performed by: Ollen Bowl, CRNA Pre-anesthesia Checklist: Patient identified, Patient being monitored, Timeout performed, Emergency Drugs available and Suction available Patient Re-evaluated:Patient Re-evaluated prior to induction Oxygen Delivery Method: Circle system utilized Preoxygenation: Pre-oxygenation with 100% oxygen Induction Type: IV induction Ventilation: Mask ventilation without difficulty Laryngoscope Size: Mac and 3 Grade View: Grade I Tube type: Oral Tube size: 7.0 mm Number of attempts: 1 Airway Equipment and Method: Stylet Placement Confirmation: ETT inserted through vocal cords under direct vision,  positive ETCO2 and breath sounds checked- equal and bilateral Secured at: 22 cm Tube secured with: Tape Dental Injury: Teeth and Oropharynx as per pre-operative assessment

## 2019-07-18 NOTE — Progress Notes (Signed)
Dr. Olevia Bowens wants patient to use IS each time she receives pain medication. This RN educated on IS use and importance. Will continue to monitor pt

## 2019-07-18 NOTE — Interval H&P Note (Signed)
History and Physical Interval Note:  07/18/2019 8:58 AM  Cheryl Hampton  has presented today for surgery, with the diagnosis of acute cholecystitis.  The various methods of treatment have been discussed with the patient and family. After consideration of risks, benefits and other options for treatment, the patient has consented to  Procedure(s): LAPAROSCOPIC CHOLECYSTECTOMY (N/A) as a surgical intervention.  The patient's history has been reviewed, patient examined, no change in status, stable for surgery.  I have reviewed the patient's chart and labs.  Questions were answered to the patient's satisfaction.     Aviva Signs

## 2019-07-18 NOTE — Anesthesia Preprocedure Evaluation (Signed)
Anesthesia Evaluation  Patient identified by MRN, date of birth, ID band Patient awake    Reviewed: Allergy & Precautions, NPO status , Patient's Chart, lab work & pertinent test results  Airway Mallampati: II  TM Distance: >3 FB Neck ROM: Full    Dental no notable dental hx. (+) Teeth Intact   Pulmonary neg pulmonary ROS,    Pulmonary exam normal breath sounds clear to auscultation       Cardiovascular Exercise Tolerance: Good hypertension, Normal cardiovascular examI Rhythm:Regular Rate:Normal     Neuro/Psych  Headaches, Anxiety Depression  Neuromuscular disease negative psych ROS   GI/Hepatic negative GI ROS, Neg liver ROS,   Endo/Other  Hypothyroidism Morbid obesity  Renal/GU negative Renal ROS  negative genitourinary   Musculoskeletal negative musculoskeletal ROS (+)   Abdominal   Peds negative pediatric ROS (+)  Hematology negative hematology ROS (+)   Anesthesia Other Findings   Reproductive/Obstetrics negative OB ROS                             Anesthesia Physical Anesthesia Plan  ASA: III  Anesthesia Plan: General   Post-op Pain Management:    Induction: Intravenous  PONV Risk Score and Plan: Ondansetron, Dexamethasone, Treatment may vary due to age or medical condition and Midazolam  Airway Management Planned: Oral ETT  Additional Equipment:   Intra-op Plan:   Post-operative Plan: Extubation in OR  Informed Consent: I have reviewed the patients History and Physical, chart, labs and discussed the procedure including the risks, benefits and alternatives for the proposed anesthesia with the patient or authorized representative who has indicated his/her understanding and acceptance.     Dental advisory given  Plan Discussed with: CRNA  Anesthesia Plan Comments: (Plan Full PPE use Plan GETA D/W PT -WTP with same after Q&A)        Anesthesia Quick  Evaluation

## 2019-07-18 NOTE — OR Nursing (Signed)
Patient moaning due to pain.  Order received from dr. Hilaria Ota to give fentanyl.   See MAR,  Patient asleep at present

## 2019-07-18 NOTE — OR Nursing (Signed)
Continues to moan complaining of pain,  Crying.  Fentanyl  50 mcg given per verbal order from Dr. Hilaria Ota.

## 2019-07-18 NOTE — Anesthesia Postprocedure Evaluation (Signed)
Anesthesia Post Note  Patient: Cheryl Hampton  Procedure(s) Performed: LAPAROSCOPIC CHOLECYSTECTOMY (N/A Abdomen)  Patient location during evaluation: PACU Anesthesia Type: General Level of consciousness: awake and alert and patient cooperative Pain management: satisfactory to patient Vital Signs Assessment: post-procedure vital signs reviewed and stable Respiratory status: spontaneous breathing Cardiovascular status: stable Postop Assessment: no apparent nausea or vomiting Anesthetic complications: no     Last Vitals:  Vitals:   07/18/19 1300 07/18/19 1315  BP: 121/82 123/82  Pulse: (!) 112 (!) 103  Resp: 19 19  Temp:    SpO2: 92% 91%    Last Pain:  Vitals:   07/18/19 1315  TempSrc:   PainSc: 0-No pain                 Skylynne Schlechter

## 2019-07-18 NOTE — Progress Notes (Signed)
MEDICATION RELATED CONSULT NOTE - INITIAL   Pharmacy Consult for hypophosphatemia  Indication: Phos of 1.0  Allergies  Allergen Reactions  . Gabapentin     Made her feel funny    Patient Measurements: Height: 5\' 4"  (162.6 cm) Weight: 242 lb 8.1 oz (110 kg) IBW/kg (Calculated) : 54.7 Adjusted Body Weight:   Vital Signs: Temp: 98.5 F (36.9 C) (01/26 0851) Temp Source: Oral (01/26 0733) BP: 137/95 (01/26 0846) Pulse Rate: 111 (01/26 0846) Intake/Output from previous day: 01/25 0701 - 01/26 0700 In: 3095.2 [P.O.:240; I.V.:1125.4; IV Piggyback:1729.8] Out: 2100 [Urine:2100] Intake/Output from this shift: No intake/output data recorded.  Labs: Recent Labs    07/17/19 0543 07/17/19 0543 07/17/19 1643 07/17/19 2332 07/18/19 0834  WBC 23.3*  --   --  20.4* 17.4*  HGB 10.8*  --   --  10.4* 10.9*  HCT 34.3*  --   --  33.1* 35.0*  PLT 343  --   --  323 310  APTT 30  --   --   --   --   CREATININE 0.56   < > 0.67 0.42* 0.47  MG 1.2*  --   --  2.3 2.4  PHOS 1.8*  --   --   --  1.0*  ALBUMIN 2.6*  --   --  2.6* 2.5*  PROT 5.6*  --   --  5.6* 5.9*  AST 13*  --   --  11* 13*  ALT 13  --   --  12 11  ALKPHOS 27*  --   --  34* 40  BILITOT 0.8  --   --  0.6 0.7   < > = values in this interval not displayed.   Estimated Creatinine Clearance: 122.4 mL/min (by C-G formula based on SCr of 0.47 mg/dL).   Microbiology: Recent Results (from the past 720 hour(s))  Respiratory Panel by RT PCR (Flu A&B, Covid) - Nasopharyngeal Swab     Status: None   Collection Time: 07/17/19  4:19 AM   Specimen: Nasopharyngeal Swab  Result Value Ref Range Status   SARS Coronavirus 2 by RT PCR NEGATIVE NEGATIVE Final    Comment: (NOTE) SARS-CoV-2 target nucleic acids are NOT DETECTED. The SARS-CoV-2 RNA is generally detectable in upper respiratoy specimens during the acute phase of infection. The lowest concentration of SARS-CoV-2 viral copies this assay can detect is 131 copies/mL. A negative  result does not preclude SARS-Cov-2 infection and should not be used as the sole basis for treatment or other patient management decisions. A negative result may occur with  improper specimen collection/handling, submission of specimen other than nasopharyngeal swab, presence of viral mutation(s) within the areas targeted by this assay, and inadequate number of viral copies (<131 copies/mL). A negative result must be combined with clinical observations, patient history, and epidemiological information. The expected result is Negative. Fact Sheet for Patients:  PinkCheek.be Fact Sheet for Healthcare Providers:  GravelBags.it This test is not yet ap proved or cleared by the [place] FDA and  has been authorized for detection and/or diagnosis of SARS-CoV-2 by FDA under an Emergency Use Authorization (EUA). This EUA will remain  in effect (meaning this test can be used) for the duration of the COVID-19 declaration under Section 564(b)(1) of the Act, 21 U.S.C. section 360bbb-3(b)(1), unless the authorization is terminated or revoked sooner.    Influenza A by PCR NEGATIVE NEGATIVE Final   Influenza B by PCR NEGATIVE NEGATIVE Final    Comment: (NOTE) The  Xpert Xpress SARS-CoV-2/FLU/RSV assay is intended as an aid in  the diagnosis of influenza from Nasopharyngeal swab specimens and  should not be used as a sole basis for treatment. Nasal washings and  aspirates are unacceptable for Xpert Xpress SARS-CoV-2/FLU/RSV  testing. Fact Sheet for Patients: PinkCheek.be Fact Sheet for Healthcare Providers: GravelBags.it This test is not yet approved or cleared by the [place] FDA and  has been authorized for detection and/or diagnosis of SARS-CoV-2 by  FDA under an Emergency Use Authorization (EUA). This EUA will remain  in effect (meaning this test can be used) for the  duration of the  Covid-19 declaration under Section 564(b)(1) of the Act, 21  U.S.C. section 360bbb-3(b)(1), unless the authorization is  terminated or revoked. Performed at [facility], [address], [place], [place] [address]   MRSA PCR Screening     Status: None   Collection Time: 07/17/19  4:04 PM   Specimen: Nasal Mucosa; Nasopharyngeal  Result Value Ref Range Status   MRSA by PCR NEGATIVE NEGATIVE Final    Comment:        The GeneXpert MRSA Assay (FDA approved for NASAL specimens only), is one component of a comprehensive MRSA colonization surveillance program. It is not intended to diagnose MRSA infection nor to guide or monitor treatment for MRSA infections. Performed at [facility], [address] [place], [place] [address]   Surgical PCR screen     Status: None   Collection Time: 07/17/19  8:44 PM   Specimen: Nasal Mucosa; Nasal Swab  Result Value Ref Range Status   MRSA, PCR NEGATIVE NEGATIVE Final   Staphylococcus aureus NEGATIVE NEGATIVE Final    Comment: (NOTE) The Xpert SA Assay (FDA approved for NASAL specimens in patients 58 years of age and older), is one component of a comprehensive surveillance program. It is not intended to diagnose infection nor to guide or monitor treatment. Performed at [facility], [address], [place], [place] [address]     Medical History: Past Medical History:  Diagnosis Date  . Depression    Post partum took lexapro and wellbutrin and it helped  . Hypothyroid 2012 dx   post partum hyperthyroid  . Migraine headache      Goal of Therapy:  Replace phosphorus to goal range of 2.5-4.6  Plan:  Potassium Phosphate 30 mmol IV x 1 dose. Repeat phos level in AM  Margot Ables, PharmD Clinical Pharmacist 07/18/2019 9:44 AM

## 2019-07-18 NOTE — Progress Notes (Signed)
Dr. Olevia Bowens paged and made aware that pt is requesting more pain medication when Morphine 4mg  was given @ 0153. HR currently-112, RR-29. Pt requesting something for breakthrough pain. Waiting for orders/call back. Will continue to monitor pt

## 2019-07-18 NOTE — Transfer of Care (Signed)
Immediate Anesthesia Transfer of Care Note  Patient: Cheryl Hampton  Procedure(s) Performed: LAPAROSCOPIC CHOLECYSTECTOMY (N/A Abdomen)  Patient Location: PACU  Anesthesia Type:General  Level of Consciousness: awake  Airway & Oxygen Therapy: Patient Spontanous Breathing  Post-op Assessment: Report given to RN  Post vital signs: Reviewed  Last Vitals:  Vitals Value Taken Time  BP 123/69 07/18/19 1249  Temp    Pulse 109 07/18/19 1253  Resp 19 07/18/19 1253  SpO2 100 % 07/18/19 1253  Vitals shown include unvalidated device data.  Last Pain:  Vitals:   07/18/19 0846  TempSrc:   PainSc: 7          Complications: No apparent anesthesia complications

## 2019-07-18 NOTE — OR Nursing (Signed)
Per lab [  Quillian Quince } patient nurse from ICU called and stated that phosphorus level is 1.  Reported to Dr. Hilaria Ota and Dr. Arnoldo Morale.  Patient will be transfused K-Phos post operatively per Dr. Arnoldo Morale

## 2019-07-19 DIAGNOSIS — K82A1 Gangrene of gallbladder in cholecystitis: Secondary | ICD-10-CM | POA: Diagnosis not present

## 2019-07-19 DIAGNOSIS — Z79899 Other long term (current) drug therapy: Secondary | ICD-10-CM | POA: Diagnosis not present

## 2019-07-19 DIAGNOSIS — K8000 Calculus of gallbladder with acute cholecystitis without obstruction: Secondary | ICD-10-CM | POA: Diagnosis not present

## 2019-07-19 DIAGNOSIS — E039 Hypothyroidism, unspecified: Secondary | ICD-10-CM | POA: Diagnosis not present

## 2019-07-19 DIAGNOSIS — Z6841 Body Mass Index (BMI) 40.0 and over, adult: Secondary | ICD-10-CM | POA: Diagnosis not present

## 2019-07-19 DIAGNOSIS — Z79890 Hormone replacement therapy (postmenopausal): Secondary | ICD-10-CM | POA: Diagnosis not present

## 2019-07-19 DIAGNOSIS — Z20822 Contact with and (suspected) exposure to covid-19: Secondary | ICD-10-CM | POA: Diagnosis not present

## 2019-07-19 DIAGNOSIS — Z833 Family history of diabetes mellitus: Secondary | ICD-10-CM | POA: Diagnosis not present

## 2019-07-19 LAB — CBC
HCT: 29.1 % — ABNORMAL LOW (ref 36.0–46.0)
Hemoglobin: 9 g/dL — ABNORMAL LOW (ref 12.0–15.0)
MCH: 30.2 pg (ref 26.0–34.0)
MCHC: 30.9 g/dL (ref 30.0–36.0)
MCV: 97.7 fL (ref 80.0–100.0)
Platelets: 277 10*3/uL (ref 150–400)
RBC: 2.98 MIL/uL — ABNORMAL LOW (ref 3.87–5.11)
RDW: 14.1 % (ref 11.5–15.5)
WBC: 13.9 10*3/uL — ABNORMAL HIGH (ref 4.0–10.5)
nRBC: 0 % (ref 0.0–0.2)

## 2019-07-19 LAB — COMPREHENSIVE METABOLIC PANEL
ALT: 16 U/L (ref 0–44)
AST: 17 U/L (ref 15–41)
Albumin: 2.3 g/dL — ABNORMAL LOW (ref 3.5–5.0)
Alkaline Phosphatase: 43 U/L (ref 38–126)
Anion gap: 6 (ref 5–15)
BUN: <5 mg/dL — ABNORMAL LOW (ref 6–20)
CO2: 22 mmol/L (ref 22–32)
Calcium: 7.2 mg/dL — ABNORMAL LOW (ref 8.9–10.3)
Chloride: 107 mmol/L (ref 98–111)
Creatinine, Ser: 0.42 mg/dL — ABNORMAL LOW (ref 0.44–1.00)
GFR calc Af Amer: >60 mL/min (ref >60)
GFR calc non Af Amer: >60 mL/min (ref >60)
Glucose, Bld: 99 mg/dL (ref 70–99)
Potassium: 3.1 mmol/L — ABNORMAL LOW (ref 3.5–5.1)
Sodium: 135 mmol/L (ref 135–145)
Total Bilirubin: 0.5 mg/dL (ref 0.3–1.2)
Total Protein: 5.4 g/dL — ABNORMAL LOW (ref 6.5–8.1)

## 2019-07-19 LAB — PHOSPHORUS: Phosphorus: 1.6 mg/dL — ABNORMAL LOW (ref 2.5–4.6)

## 2019-07-19 LAB — SURGICAL PATHOLOGY

## 2019-07-19 LAB — MAGNESIUM: Magnesium: 2.2 mg/dL (ref 1.7–2.4)

## 2019-07-19 MED ORDER — POTASSIUM CHLORIDE 10 MEQ/100ML IV SOLN
INTRAVENOUS | Status: AC
  Filled 2019-07-19: qty 100

## 2019-07-19 MED ORDER — POTASSIUM PHOSPHATES 15 MMOLE/5ML IV SOLN
30.0000 mmol | Freq: Once | INTRAVENOUS | Status: AC
  Administered 2019-07-19: 30 mmol via INTRAVENOUS
  Filled 2019-07-19: qty 10

## 2019-07-19 MED ORDER — HYDROCHLOROTHIAZIDE 12.5 MG PO CAPS
12.5000 mg | ORAL_CAPSULE | Freq: Every day | ORAL | Status: DC
  Administered 2019-07-19 – 2019-07-21 (×3): 12.5 mg via ORAL
  Filled 2019-07-19 (×3): qty 1

## 2019-07-19 MED ORDER — POLYETHYLENE GLYCOL 3350 17 G PO PACK
17.0000 g | PACK | Freq: Every day | ORAL | Status: DC
  Administered 2019-07-19 – 2019-07-21 (×3): 17 g via ORAL
  Filled 2019-07-19 (×3): qty 1

## 2019-07-19 MED ORDER — POTASSIUM CHLORIDE 10 MEQ/100ML IV SOLN
10.0000 meq | INTRAVENOUS | Status: AC
  Administered 2019-07-19 (×5): 10 meq via INTRAVENOUS
  Filled 2019-07-19 (×2): qty 100

## 2019-07-19 MED ORDER — LISINOPRIL 10 MG PO TABS
10.0000 mg | ORAL_TABLET | Freq: Every day | ORAL | Status: DC
  Administered 2019-07-19 – 2019-07-21 (×3): 10 mg via ORAL
  Filled 2019-07-19 (×3): qty 1

## 2019-07-19 NOTE — Progress Notes (Signed)
Report called and given to Gilmore Laroche, RN on 300. Pt to be transported to room 317, although RN is checking if she can get patient moved. Will call and check before transporting up.

## 2019-07-19 NOTE — Progress Notes (Signed)
This nurse notified Dr. Arnoldo Morale patient b/p 142/105, rechecked manually 150/90, pulse 103. Patient is anxious. Stopped fluids as ordered by Dr. Arnoldo Morale. Will start patient on  Zestoretic 10-12.5 like she takes at home. Patient SP02 95% RA.

## 2019-07-19 NOTE — Progress Notes (Signed)
Pt transported to room 314 via wheelchair by S.Joaquim Lai, NT and Ruel Favors, Therapist, sports.

## 2019-07-20 LAB — CBC
HCT: 29 % — ABNORMAL LOW (ref 36.0–46.0)
Hemoglobin: 9.1 g/dL — ABNORMAL LOW (ref 12.0–15.0)
MCH: 30.5 pg (ref 26.0–34.0)
MCHC: 31.4 g/dL (ref 30.0–36.0)
MCV: 97.3 fL (ref 80.0–100.0)
Platelets: 320 10*3/uL (ref 150–400)
RBC: 2.98 MIL/uL — ABNORMAL LOW (ref 3.87–5.11)
RDW: 13.9 % (ref 11.5–15.5)
WBC: 10.7 10*3/uL — ABNORMAL HIGH (ref 4.0–10.5)
nRBC: 0 % (ref 0.0–0.2)

## 2019-07-20 LAB — COMPREHENSIVE METABOLIC PANEL
ALT: 22 U/L (ref 0–44)
AST: 21 U/L (ref 15–41)
Albumin: 2.3 g/dL — ABNORMAL LOW (ref 3.5–5.0)
Alkaline Phosphatase: 59 U/L (ref 38–126)
Anion gap: 6 (ref 5–15)
BUN: <5 mg/dL — ABNORMAL LOW (ref 6–20)
CO2: 23 mmol/L (ref 22–32)
Calcium: 7.4 mg/dL — ABNORMAL LOW (ref 8.9–10.3)
Chloride: 105 mmol/L (ref 98–111)
Creatinine, Ser: 0.49 mg/dL (ref 0.44–1.00)
GFR calc Af Amer: >60 mL/min (ref >60)
GFR calc non Af Amer: >60 mL/min (ref >60)
Glucose, Bld: 112 mg/dL — ABNORMAL HIGH (ref 70–99)
Potassium: 3.3 mmol/L — ABNORMAL LOW (ref 3.5–5.1)
Sodium: 134 mmol/L — ABNORMAL LOW (ref 135–145)
Total Bilirubin: 0.6 mg/dL (ref 0.3–1.2)
Total Protein: 5.9 g/dL — ABNORMAL LOW (ref 6.5–8.1)

## 2019-07-20 LAB — MAGNESIUM: Magnesium: 2.3 mg/dL (ref 1.7–2.4)

## 2019-07-20 LAB — PHOSPHORUS: Phosphorus: 1.8 mg/dL — ABNORMAL LOW (ref 2.5–4.6)

## 2019-07-20 MED ORDER — ENALAPRILAT 1.25 MG/ML IV SOLN
1.2500 mg | Freq: Four times a day (QID) | INTRAVENOUS | Status: DC | PRN
  Administered 2019-07-20: 1.25 mg via INTRAVENOUS
  Filled 2019-07-20: qty 2

## 2019-07-20 MED ORDER — POTASSIUM PHOSPHATES 15 MMOLE/5ML IV SOLN
30.0000 mmol | Freq: Once | INTRAVENOUS | Status: AC
  Administered 2019-07-20: 15:00:00 30 mmol via INTRAVENOUS
  Filled 2019-07-20 (×2): qty 10

## 2019-07-20 MED ORDER — PANTOPRAZOLE SODIUM 40 MG PO TBEC
40.0000 mg | DELAYED_RELEASE_TABLET | Freq: Every day | ORAL | Status: DC
  Administered 2019-07-20: 40 mg via ORAL
  Filled 2019-07-20: qty 1

## 2019-07-20 NOTE — Progress Notes (Signed)
2 Days Post-Op  Subjective: Patient feels fatigued but better.  Appetite has improved.  Still feels swollen.  Mild incisional pain.  Objective: Vital signs in last 24 hours: Temp:  [98.1 F (36.7 C)-100.2 F (37.9 C)] 100.2 F (37.9 C) (01/28 0634) Pulse Rate:  [89-110] 110 (01/28 0634) Resp:  [15-29] 20 (01/28 0634) BP: (142-150)/(89-105) 145/89 (01/28 0634) SpO2:  [91 %-100 %] 97 % (01/28 0634) Last BM Date: 07/15/19  Intake/Output from previous day: 01/27 0701 - 01/28 0700 In: 2531.7 [P.O.:240; I.V.:1035.4; IV Piggyback:1256.3] Out: -  Intake/Output this shift: No intake/output data recorded.  General appearance: alert, cooperative and no distress Resp: clear to auscultation bilaterally Cardio: regular rate and rhythm, S1, S2 normal, no murmur, click, rub or gallop GI: Soft, incisions healing well.  Lab Results:  Recent Labs    07/19/19 0411 07/20/19 0818  WBC 13.9* 10.7*  HGB 9.0* 9.1*  HCT 29.1* 29.0*  PLT 277 320   BMET Recent Labs    07/19/19 0411 07/20/19 0818  NA 135 134*  K 3.1* 3.3*  CL 107 105  CO2 22 23  GLUCOSE 99 112*  BUN <5* <5*  CREATININE 0.42* 0.49  CALCIUM 7.2* 7.4*   PT/INR No results for input(s): LABPROT, INR in the last 72 hours.  Studies/Results: No results found.  Anti-infectives: Anti-infectives (From admission, onward)   Start     Dose/Rate Route Frequency Ordered Stop   07/17/19 2000  piperacillin-tazobactam (ZOSYN) IVPB 3.375 g     3.375 g 12.5 mL/hr over 240 Minutes Intravenous Every 8 hours 07/17/19 1009     07/17/19 1200  piperacillin-tazobactam (ZOSYN) IVPB 3.375 g     3.375 g 100 mL/hr over 30 Minutes Intravenous  Once 07/17/19 0948 07/17/19 1152   07/17/19 0800  ceFEPIme (MAXIPIME) 2 g in sodium chloride 0.9 % 100 mL IVPB  Status:  Discontinued     2 g 200 mL/hr over 30 Minutes Intravenous Every 8 hours 07/17/19 0553 07/17/19 0948   07/17/19 0530  ceFEPIme (MAXIPIME) 2 g in sodium chloride 0.9 % 100 mL IVPB   Status:  Discontinued     2 g 200 mL/hr over 30 Minutes Intravenous  Once 07/17/19 0519 07/17/19 0553   07/17/19 0530  metroNIDAZOLE (FLAGYL) IVPB 500 mg  Status:  Discontinued     500 mg 100 mL/hr over 60 Minutes Intravenous Every 8 hours 07/17/19 0519 07/17/19 0948   07/17/19 0500  piperacillin-tazobactam (ZOSYN) IVPB 3.375 g  Status:  Discontinued     3.375 g 100 mL/hr over 30 Minutes Intravenous Every 6 hours 07/17/19 0415 07/17/19 0519   07/16/19 2300  piperacillin-tazobactam (ZOSYN) IVPB 3.375 g     3.375 g 100 mL/hr over 30 Minutes Intravenous  Once 07/16/19 2250 07/16/19 2345      Assessment/Plan: s/p Procedure(s): LAPAROSCOPIC CHOLECYSTECTOMY Impression: Continues to slowly improve, status post laparoscopic cholecystectomy for empyema of the gallbladder.  Her leukocytosis has been resolving.  Her pain seems to be under better control.  She is still fatigued and I suspect some of her mild fevers may be secondary to splinting. Plan: We will get her ambulating.  Still has mild hypophosphatemia which is being addressed by pharmacy.  Anticipate discharge in next 24 to 48 hours.  LOS: 3 days    Aviva Signs 07/20/2019

## 2019-07-20 NOTE — Progress Notes (Addendum)
Patient B/P160/100 manually, Dr. Arnoldo Morale made aware. New orders: Vasotec 1.25mg  every 6 hours as needed for high blood pressure. Will continue to monitor patient B/P.

## 2019-07-20 NOTE — Progress Notes (Signed)
MEDICATION RELATED CONSULT NOTE -   Pharmacy Consult for hypophosphatemia  Indication: Phos of 1.0  Allergies  Allergen Reactions  . Gabapentin     Made her feel funny    Patient Measurements: Height: 5\' 4"  (162.6 cm) Weight: 242 lb 8.1 oz (110 kg) IBW/kg (Calculated) : 54.7 Adjusted Body Weight:   Vital Signs: Temp: 100.2 F (37.9 C) (01/28 0634) Temp Source: Oral (01/28 0634) BP: 145/89 (01/28 0634) Pulse Rate: 110 (01/28 0634) Intake/Output from previous day: 01/27 0701 - 01/28 0700 In: 2531.7 [P.O.:240; I.V.:1035.4; IV Piggyback:1256.3] Out: -  Intake/Output from this shift: Total I/O In: 240 [P.O.:240] Out: -   Labs: Recent Labs    07/18/19 0834 07/19/19 0411 07/20/19 0818  WBC 17.4* 13.9* 10.7*  HGB 10.9* 9.0* 9.1*  HCT 35.0* 29.1* 29.0*  PLT 310 277 320  CREATININE 0.47 0.42* 0.49  MG 2.4 2.2 2.3  PHOS 1.0* 1.6* 1.8*  ALBUMIN 2.5* 2.3* 2.3*  PROT 5.9* 5.4* 5.9*  AST 13* 17 21  ALT 11 16 22   ALKPHOS 40 43 59  BILITOT 0.7 0.5 0.6   Estimated Creatinine Clearance: 122.4 mL/min (by C-G formula based on SCr of 0.49 mg/dL).   Microbiology: Recent Results (from the past 720 hour(s))  Respiratory Panel by RT PCR (Flu A&B, Covid) - Nasopharyngeal Swab     Status: None   Collection Time: 07/17/19  4:19 AM   Specimen: Nasopharyngeal Swab  Result Value Ref Range Status   SARS Coronavirus 2 by RT PCR NEGATIVE NEGATIVE Final    Comment: (NOTE) SARS-CoV-2 target nucleic acids are NOT DETECTED. The SARS-CoV-2 RNA is generally detectable in upper respiratoy specimens during the acute phase of infection. The lowest concentration of SARS-CoV-2 viral copies this assay can detect is 131 copies/mL. A negative result does not preclude SARS-Cov-2 infection and should not be used as the sole basis for treatment or other patient management decisions. A negative result may occur with  improper specimen collection/handling, submission of specimen other than  nasopharyngeal swab, presence of viral mutation(s) within the areas targeted by this assay, and inadequate number of viral copies (<131 copies/mL). A negative result must be combined with clinical observations, patient history, and epidemiological information. The expected result is Negative. Fact Sheet for Patients:  PinkCheek.be Fact Sheet for Healthcare Providers:  GravelBags.it This test is not yet ap proved or cleared by the [place] FDA and  has been authorized for detection and/or diagnosis of SARS-CoV-2 by FDA under an Emergency Use Authorization (EUA). This EUA will remain  in effect (meaning this test can be used) for the duration of the COVID-19 declaration under Section 564(b)(1) of the Act, 21 U.S.C. section 360bbb-3(b)(1), unless the authorization is terminated or revoked sooner.    Influenza A by PCR NEGATIVE NEGATIVE Final   Influenza B by PCR NEGATIVE NEGATIVE Final    Comment: (NOTE) The Xpert Xpress SARS-CoV-2/FLU/RSV assay is intended as an aid in  the diagnosis of influenza from Nasopharyngeal swab specimens and  should not be used as a sole basis for treatment. Nasal washings and  aspirates are unacceptable for Xpert Xpress SARS-CoV-2/FLU/RSV  testing. Fact Sheet for Patients: PinkCheek.be Fact Sheet for Healthcare Providers: GravelBags.it This test is not yet approved or cleared by the [place] FDA and  has been authorized for detection and/or diagnosis of SARS-CoV-2 by  FDA under an Emergency Use Authorization (EUA). This EUA will remain  in effect (meaning this test can be used) for the duration of the  Covid-19 declaration under Section 564(b)(1) of the Act, 21  U.S.C. section 360bbb-3(b)(1), unless the authorization is  terminated or revoked. Performed at [facility], [address] [place], [place] [address]   MRSA PCR Screening      Status: None   Collection Time: 07/17/19  4:04 PM   Specimen: Nasal Mucosa; Nasopharyngeal  Result Value Ref Range Status   MRSA by PCR NEGATIVE NEGATIVE Final    Comment:        The GeneXpert MRSA Assay (FDA approved for NASAL specimens only), is one component of a comprehensive MRSA colonization surveillance program. It is not intended to diagnose MRSA infection nor to guide or monitor treatment for MRSA infections. Performed at [facility], [address], [place],  [address]   Surgical PCR screen     Status: None   Collection Time: 07/17/19  8:44 PM   Specimen: Nasal Mucosa; Nasal Swab  Result Value Ref Range Status   MRSA, PCR NEGATIVE NEGATIVE Final   Staphylococcus aureus NEGATIVE NEGATIVE Final    Comment: (NOTE) The Xpert SA Assay (FDA approved for NASAL specimens in patients 63 years of age and older), is one component of a comprehensive surveillance program. It is not intended to diagnose infection nor to guide or monitor treatment. Performed at [facility], [address], [place],  [address]     Medical History: Past Medical History:  Diagnosis Date  . Depression    Post partum took lexapro and wellbutrin and it helped  . Hypothyroid 2012 dx   post partum hyperthyroid  . Migraine headache    Phos 1.8 K 3.3  Goal of Therapy:  Replace phosphorus to goal range of 2.5-4.6  Plan:  Potassium Phosphate 30 mmol IV x 1 dose. Repeat phos level in AM  Margot Ables, PharmD Clinical Pharmacist 07/20/2019 10:22 AM

## 2019-07-21 LAB — PHOSPHORUS: Phosphorus: 4 mg/dL (ref 2.5–4.6)

## 2019-07-21 MED ORDER — OXYCODONE-ACETAMINOPHEN 7.5-325 MG PO TABS: 1.0000 | ORAL_TABLET | Freq: Four times a day (QID) | ORAL | 0 refills | Status: DC | PRN

## 2019-07-21 MED ORDER — AMOXICILLIN-POT CLAVULANATE 875-125 MG PO TABS: 1.0000 | ORAL_TABLET | Freq: Two times a day (BID) | ORAL | 0 refills | Status: DC

## 2019-07-21 MED ORDER — ONDANSETRON HCL 4 MG PO TABS: 4.0000 mg | ORAL_TABLET | Freq: Three times a day (TID) | ORAL | 1 refills | Status: DC | PRN

## 2019-07-21 NOTE — Progress Notes (Signed)
MEDICATION RELATED CONSULT NOTE -   Pharmacy Consult for hypophosphatemia  Indication: Low Phos  Allergies  Allergen Reactions  . Gabapentin     Made her feel funny    Patient Measurements: Height: 5\' 4"  (162.6 cm) Weight: 242 lb 8.1 oz (110 kg) IBW/kg (Calculated) : 54.7 Adjusted Body Weight:   Vital Signs: Temp: 98.3 F (36.8 C) (01/29 0459) Temp Source: Oral (01/28 2156) BP: 135/83 (01/29 0459) Pulse Rate: 88 (01/29 0459) Intake/Output from previous day: 01/28 0701 - 01/29 0700 In: 348.1 [P.O.:240; IV Piggyback:108.1] Out: -  Intake/Output from this shift: No intake/output data recorded.  Labs: Recent Labs    07/18/19 0834 07/18/19 0834 07/19/19 0411 07/20/19 0818 07/21/19 0555  WBC 17.4*  --  13.9* 10.7*  --   HGB 10.9*  --  9.0* 9.1*  --   HCT 35.0*  --  29.1* 29.0*  --   PLT 310  --  277 320  --   CREATININE 0.47  --  0.42* 0.49  --   MG 2.4  --  2.2 2.3  --   PHOS 1.0*   < > 1.6* 1.8* 4.0  ALBUMIN 2.5*  --  2.3* 2.3*  --   PROT 5.9*  --  5.4* 5.9*  --   AST 13*  --  17 21  --   ALT 11  --  16 22  --   ALKPHOS 40  --  43 59  --   BILITOT 0.7  --  0.5 0.6  --    < > = values in this interval not displayed.   Estimated Creatinine Clearance: 122.4 mL/min (by C-G formula based on SCr of 0.49 mg/dL).   Microbiology: Recent Results (from the past 720 hour(s))  Respiratory Panel by RT PCR (Flu A&B, Covid) - Nasopharyngeal Swab     Status: None   Collection Time: 07/17/19  4:19 AM   Specimen: Nasopharyngeal Swab  Result Value Ref Range Status   SARS Coronavirus 2 by RT PCR NEGATIVE NEGATIVE Final    Comment: (NOTE) SARS-CoV-2 target nucleic acids are NOT DETECTED. The SARS-CoV-2 RNA is generally detectable in upper respiratoy specimens during the acute phase of infection. The lowest concentration of SARS-CoV-2 viral copies this assay can detect is 131 copies/mL. A negative result does not preclude SARS-Cov-2 infection and should not be used as the  sole basis for treatment or other patient management decisions. A negative result may occur with  improper specimen collection/handling, submission of specimen other than nasopharyngeal swab, presence of viral mutation(s) within the areas targeted by this assay, and inadequate number of viral copies (<131 copies/mL). A negative result must be combined with clinical observations, patient history, and epidemiological information. The expected result is Negative. Fact Sheet for Patients:  PinkCheek.be Fact Sheet for Healthcare Providers:  GravelBags.it This test is not yet ap proved or cleared by the [place] FDA and  has been authorized for detection and/or diagnosis of SARS-CoV-2 by FDA under an Emergency Use Authorization (EUA). This EUA will remain  in effect (meaning this test can be used) for the duration of the COVID-19 declaration under Section 564(b)(1) of the Act, 21 U.S.C. section 360bbb-3(b)(1), unless the authorization is terminated or revoked sooner.    Influenza A by PCR NEGATIVE NEGATIVE Final   Influenza B by PCR NEGATIVE NEGATIVE Final    Comment: (NOTE) The Xpert Xpress SARS-CoV-2/FLU/RSV assay is intended as an aid in  the diagnosis of influenza from Nasopharyngeal swab specimens and  should not be used as a sole basis for treatment. Nasal washings and  aspirates are unacceptable for Xpert Xpress SARS-CoV-2/FLU/RSV  testing. Fact Sheet for Patients: PinkCheek.be Fact Sheet for Healthcare Providers: GravelBags.it This test is not yet approved or cleared by the [place] FDA and  has been authorized for detection and/or diagnosis of SARS-CoV-2 by  FDA under an Emergency Use Authorization (EUA). This EUA will remain  in effect (meaning this test can be used) for the duration of the  Covid-19 declaration under Section 564(b)(1) of the Act, 21   U.S.C. section 360bbb-3(b)(1), unless the authorization is  terminated or revoked. Performed at [facility], [address], [place], [place] [address]   MRSA PCR Screening     Status: None   Collection Time: 07/17/19  4:04 PM   Specimen: Nasal Mucosa; Nasopharyngeal  Result Value Ref Range Status   MRSA by PCR NEGATIVE NEGATIVE Final    Comment:        The GeneXpert MRSA Assay (FDA approved for NASAL specimens only), is one component of a comprehensive MRSA colonization surveillance program. It is not intended to diagnose MRSA infection nor to guide or monitor treatment for MRSA infections. Performed at [facility], [address], [place], [place] [address]   Surgical PCR screen     Status: None   Collection Time: 07/17/19  8:44 PM   Specimen: Nasal Mucosa; Nasal Swab  Result Value Ref Range Status   MRSA, PCR NEGATIVE NEGATIVE Final   Staphylococcus aureus NEGATIVE NEGATIVE Final    Comment: (NOTE) The Xpert SA Assay (FDA approved for NASAL specimens in patients 57 years of age and older), is one component of a comprehensive surveillance program. It is not intended to diagnose infection nor to guide or monitor treatment. Performed at [facility], [address], [place],  [address]     Medical History: Past Medical History:  Diagnosis Date  . Depression    Post partum took lexapro and wellbutrin and it helped  . Hypothyroid 2012 dx   post partum hyperthyroid  . Migraine headache    Phos 4.0   Goal of Therapy:  Replace phosphorus to goal range of 2.5-4.6  Plan:  Phosphorus at goal range. No more IV phosphorus- consult to be discontinued.  Margot Ables, PharmD Clinical Pharmacist 07/21/2019 8:22 AM

## 2019-07-21 NOTE — Discharge Summary (Signed)
Physician Discharge Summary  Patient ID: Cheryl Hampton MRN: [id number] DOB/AGE: 1986/05/04 34 y.o.  Admit date: 07/16/2019 Discharge date: 07/21/2019  Admission Diagnoses: Acute cholecystitis, cholelithiasis  Discharge Diagnoses: Empyema of gallbladder Principal Problem:   Acute cholecystitis Active Problems:   Depression   Hypothyroidism   Essential hypertension   Hypomagnesemia   Hypophosphatemia   Empyema of gallbladder   Discharged Condition: good  Hospital Course: Patient is a 34 year old female who presented to the emergency room with worsening right upper quadrant abdominal pain.  She had had an outpatient CT scan done at another emergency room which showed cholelithiasis.  She was discharged from that emergency room and then presented to [facility] with worsening right upper quadrant abdominal pain, nausea, and vomiting.  Ultrasound revealed acute cholecystitis with cholelithiasis.  She was noted to have a significant leukocytosis.  She was also noted to be hypomagnesemic and hypophosphatemic. She was tachycardic preoperatively. She subsequently went to the operating room on 07/18/2019 and underwent laparoscopic cholecystectomy.  She was found at the time of surgery to have an empyema of the gallbladder.Her postoperatively course in the hospital was prolonged due to need for iv antibiotic and pain control.  Her hypophosphatemia subsequently was corrected.  She is being discharged home on 07/21/2019 in good and improving condition.   Treatments: surgery: Laparoscopic cholecystectomy on 07/18/2019  Discharge Exam: Blood pressure 135/83, pulse 88, temperature 98.3 F (36.8 C), resp. rate 16, height 5\' 4"  (1.626 m), weight 110 kg, last menstrual period 06/23/2019, SpO2 97 %, unknown if currently breastfeeding. General appearance: alert, cooperative and no distress Resp: clear to auscultation bilaterally Cardio: regular rate and rhythm, S1, S2 normal, no murmur, click, rub or  gallop GI: Soft, incisions healing well.  Disposition: Discharge disposition: 01-Home or Self Care       Discharge Instructions    Diet general   Complete by: As directed    Increase activity slowly   Complete by: As directed      Allergies as of 07/21/2019      Reactions   Gabapentin    Made her feel funny      Medication List    TAKE these medications   amoxicillin-clavulanate 875-125 MG tablet Commonly known as: Augmentin Take 1 tablet by mouth 2 (two) times daily.   butalbital-acetaminophen-caffeine 50-325-40 MG tablet Commonly known as: FIORICET Take 1 tablet by mouth every 4 (four) hours as needed for headache.   levothyroxine 100 MCG tablet Commonly known as: SYNTHROID Take 1 tablet (100 mcg total) by mouth daily.   lisinopril-hydrochlorothiazide 10-12.5 MG tablet Commonly known as: ZESTORETIC Take 1 tablet by mouth daily.   norgestimate-ethinyl estradiol 0.25-35 MG-MCG tablet Commonly known as: ORTHO-CYCLEN Take 1 tablet by mouth daily.   ondansetron 4 MG tablet Commonly known as: Zofran Take 1 tablet (4 mg total) by mouth every 8 (eight) hours as needed for nausea or vomiting.   oxyCODONE-acetaminophen 7.5-325 MG tablet Commonly known as: Percocet Take 1 tablet by mouth every 6 (six) hours as needed.   phentermine 30 MG capsule Take 1 capsule (30 mg total) by mouth every morning.   venlafaxine XR 150 MG 24 hr capsule Commonly known as: EFFEXOR-XR Take 1 capsule (150 mg total) by mouth daily with breakfast.      Follow-up Information    Aviva Signs, MD Follow up.   Specialty: General Surgery Why: Will call you next week for follow up Contact information: [id number] [place] [id number] [phone]  Signed: Aviva Signs 07/21/2019, 9:23 AM

## 2019-07-21 NOTE — Discharge Instructions (Signed)
Laparoscopic Cholecystectomy, Care After °This sheet gives you information about how to care for yourself after your procedure. Your health care provider may also give you more specific instructions. If you have problems or questions, contact your health care provider. °What can I expect after the procedure? °After the procedure, it is common to have: °· Pain at your incision sites. You will be given medicines to control this pain. °· Mild nausea or vomiting. °· Bloating and possible shoulder pain from the air-like gas that was used during the procedure. °Follow these instructions at home: °Incision care ° °· Follow instructions from your health care provider about how to take care of your incisions. Make sure you: °? Wash your hands with soap and water before you change your bandage (dressing). If soap and water are not available, use hand sanitizer. °? Change your dressing as told by your health care provider. °? Leave stitches (sutures), skin glue, or adhesive strips in place. These skin closures may need to be in place for 2 weeks or longer. If adhesive strip edges start to loosen and curl up, you may trim the loose edges. Do not remove adhesive strips completely unless your health care provider tells you to do that. °· Do not take baths, swim, or use a hot tub until your health care provider approves. Ask your health care provider if you can take showers. You may only be allowed to take sponge baths for bathing. °· Check your incision area every day for signs of infection. Check for: °? More redness, swelling, or pain. °? More fluid or blood. °? Warmth. °? Pus or a bad smell. °Activity °· Do not drive or use heavy machinery while taking prescription pain medicine. °· Do not lift anything that is heavier than 10 lb (4.5 kg) until your health care provider approves. °· Do not play contact sports until your health care provider approves. °· Do not drive for 24 hours if you were given a medicine to help you relax  (sedative). °· Rest as needed. Do not return to work or school until your health care provider approves. °General instructions °· Take over-the-counter and prescription medicines only as told by your health care provider. °· To prevent or treat constipation while you are taking prescription pain medicine, your health care provider may recommend that you: °? Drink enough fluid to keep your urine clear or pale yellow. °? Take over-the-counter or prescription medicines. °? Eat foods that are high in fiber, such as fresh fruits and vegetables, whole grains, and beans. °? Limit foods that are high in fat and processed sugars, such as fried and sweet foods. °Contact a health care provider if: °· You develop a rash. °· You have more redness, swelling, or pain around your incisions. °· You have more fluid or blood coming from your incisions. °· Your incisions feel warm to the touch. °· You have pus or a bad smell coming from your incisions. °· You have a fever. °· One or more of your incisions breaks open. °Get help right away if: °· You have trouble breathing. °· You have chest pain. °· You have increasing pain in your shoulders. °· You faint or feel dizzy when you stand. °· You have severe pain in your abdomen. °· You have nausea or vomiting that lasts for more than one day. °· You have leg pain. °This information is not intended to replace advice given to you by your health care provider. Make sure you discuss any questions you have   with your health care provider. Document Revised: 05/21/2017 Document Reviewed: 11/25/2015 Elsevier Patient Education  2020 Elsevier Inc.  

## 2019-07-21 NOTE — Progress Notes (Signed)
Nsg Discharge Note  Admit Date:  07/16/2019 Discharge date: 07/21/2019   [place] to be D/C'd Home per MD order.  AVS completed.  Copy for chart, and copy for patient signed, and dated. Patient/caregiver able to verbalize understanding.  Discharge Medication: Allergies as of 07/21/2019      Reactions   Gabapentin    Made her feel funny      Medication List    TAKE these medications   amoxicillin-clavulanate 875-125 MG tablet Commonly known as: Augmentin Take 1 tablet by mouth 2 (two) times daily.   butalbital-acetaminophen-caffeine 50-325-40 MG tablet Commonly known as: FIORICET Take 1 tablet by mouth every 4 (four) hours as needed for headache.   levothyroxine 100 MCG tablet Commonly known as: SYNTHROID Take 1 tablet (100 mcg total) by mouth daily.   lisinopril-hydrochlorothiazide 10-12.5 MG tablet Commonly known as: ZESTORETIC Take 1 tablet by mouth daily.   norgestimate-ethinyl estradiol 0.25-35 MG-MCG tablet Commonly known as: ORTHO-CYCLEN Take 1 tablet by mouth daily.   ondansetron 4 MG tablet Commonly known as: Zofran Take 1 tablet (4 mg total) by mouth every 8 (eight) hours as needed for nausea or vomiting.   oxyCODONE-acetaminophen 7.5-325 MG tablet Commonly known as: Percocet Take 1 tablet by mouth every 6 (six) hours as needed.   phentermine 30 MG capsule Take 1 capsule (30 mg total) by mouth every morning.   venlafaxine XR 150 MG 24 hr capsule Commonly known as: EFFEXOR-XR Take 1 capsule (150 mg total) by mouth daily with breakfast.       Discharge Assessment: Vitals:   07/20/19 2156 07/21/19 0459  BP: (!) 150/81 135/83  Pulse: 87 88  Resp: 20 16  Temp: 98.2 F (36.8 C) 98.3 F (36.8 C)  SpO2: 100% 97%   Skin clean, dry and intact without evidence of skin break down, no evidence of skin tears noted. IV catheter discontinued intact. Site without signs and symptoms of complications - no redness or edema noted at insertion site,  patient denies c/o pain - only slight tenderness at site.  Dressing with slight pressure applied.  D/c Instructions-Education: Discharge instructions given to patient/family with verbalized understanding. D/c education completed with patient/family including follow up instructions, medication list, d/c activities limitations if indicated, with other d/c instructions as indicated by MD - patient able to verbalize understanding, all questions fully answered. Patient instructed to return to ED, call 911, or call MD for any changes in condition.  Patient escorted via [place], and D/C home via private auto.  Zenaida Deed, RN 07/21/2019 10:23 AM

## 2019-07-22 ENCOUNTER — Encounter: Payer: Self-pay | Admitting: Internal Medicine

## 2019-07-24 ENCOUNTER — Encounter: Payer: Self-pay | Admitting: *Deleted

## 2019-07-24 ENCOUNTER — Other Ambulatory Visit: Payer: Self-pay | Admitting: *Deleted

## 2019-07-24 MED ORDER — LOSARTAN POTASSIUM-HCTZ 50-12.5 MG PO TABS: 1.0000 | ORAL_TABLET | Freq: Every day | ORAL | 3 refills | Status: DC

## 2019-07-24 NOTE — Patient Outreach (Signed)
[facility]) Care Management  07/24/2019  Cheryl Hampton Nov 07, 1985 [id number]   Transition of care call/case closure   Referral received:07/18/19 Initial outreach:07/24/19 Insurance: [employer]    Subjective: Initial successful telephone call to patient's preferred number in order to complete transition of care assessment; 2 HIPAA identifiers verified. Explained purpose of call and completed transition of care assessment.  Cheryl Hampton states that she doing better each day, denies post-operative problems, says surgical incisions are unremarkable, states surgical pain well managed with prescribed medications, usually takes perocet at night as that is when pain is the  most with trying to lie down and she  able to rest well. She reports  tolerating diet eating small amounts at at time, she reports limiting high fat processed foods. She reports occasional nausea that is relieved with zofran. She reports having bowel movement and denies concerns with voiding.   She states that her spouse and sister is assisting in  her  recovery.  She discussed noting tickling and coughing since being on lisinopril and has contacted her PCP regarding changing blood pressure medication. She declines   need a referral to one of the [place] chronic disease management programs at this time. She plans to purchase blood pressure monitor for use at home, she discussed being a CMA ,  encouraged to keep a record for MD visits/follow up on progress. She declined additional education material on blood pressure management .  She does not have the hospital indemnity, she has submitted information for FMLA. She uses a [facility] outpatient pharmacy, [facility]  She  denies educational needs related to staying safe during the [place] 19 pandemic.    Objective:  Cheryl Hampton  was hospitalized at [facility]   from 1/25-1/29/21 for Acute Cholecystitis, and Laparoscopic Cholecystectomy on 1/26, Comorbidities  include: Depression, Migraines, hypothyroidism  She was discharged to home on 07/21/19 without the need for home health services or DME.   Assessment:  Patient voices good understanding of all discharge instructions.  See transition of care flowsheet for assessment details.   Plan:  Reviewed hospital discharge diagnosis of Laparoscopic Cholecystectomy   and discharge treatment plan using hospital discharge instructions, assessing medication adherence, reviewing problems requiring provider notification, and discussing the importance of follow up with surgeon, primary care provider as directed. Reviewed [place] healthy lifestyle program information to keep premiums low for 2022:  Step 1: Get annual physical between June 22, 2018 and December 21, 2019; Step 2: Complete your health assessment between June 23, 2019 and February 21, 2020 at [email or web address] Step 3:Identify your current health status and complete the corresponding action step between January 1, and February 21, 2020.   No ongoing care management needs identified so will close case to [place] Management services and route successful outreach letter with [facility] pamphlet and 24 Hour Nurse Line Magnet to [facility] clinical pool to be mailed to patient's home address.  Thanked patient for their services to [facility].  Joylene Draft, RN, BSN  [place] Management Coordinator  [phone]- Mobile [phone]- Toll Free Main Office

## 2019-07-25 ENCOUNTER — Telehealth (INDEPENDENT_AMBULATORY_CARE_PROVIDER_SITE_OTHER): Payer: Self-pay | Admitting: General Surgery

## 2019-07-25 ENCOUNTER — Other Ambulatory Visit: Payer: Self-pay

## 2019-07-25 DIAGNOSIS — Z09 Encounter for follow-up examination after completed treatment for conditions other than malignant neoplasm: Secondary | ICD-10-CM

## 2019-07-25 NOTE — Progress Notes (Signed)
Telephone postoperative visit performed.  Patient states she still has moderate incisional pain.  She is taking her antibiotics and has 2 doses left.  Denies any fever or chills.  She still has limited mobility secondary to incisional pain, though it is slowly improving.  She may return to work on 08/14/2019.  I told her to call me should she not progress well after surgery.

## 2019-07-27 NOTE — Progress Notes (Signed)
Virtual Visit via Video Note  I connected with Cheryl Hampton on 07/30/19 at  3:30 PM EST by a video enabled telemedicine application and verified that I am speaking with the correct person using two identifiers.   I discussed the limitations of evaluation and management by telemedicine and the availability of in person appointments. The patient expressed understanding and agreed to proceed.  Present for the visit:  Myself, Dr Billey Gosling, Sharnita Apuzzo.  The patient is currently at home and I am in the office.    No referring provider.    History of Present Illness: She is here for follow up of her chronic medical conditions.   She had a lap chole on 1/26 for acute cholecystitis.  She is still recovering at home.  She is feeling better, but still has pain and tiredness.  She did lose weight throughout this process.    Hypertension: She is taking her medication daily. She is compliant with a low sodium diet.  She denies chest pain, palpitations, edema, shortness of breath and regular headaches. She does not monitor her blood pressure at home.  She used to check it at work.    Hypothyroidism:  She is taking her medication daily.  She denies any recent changes in energy or weight that are unexplained.   Depression, anxiety: She is taking her medication daily as prescribed. She denies any side effects from the medication. She feels her depression and anxiety are well controlled and she is happy with her current dose of medication.    Review of Systems  Constitutional: Negative for chills and fever.  Respiratory: Negative for shortness of breath.   Cardiovascular: Negative for chest pain, palpitations and leg swelling.  Neurological: Negative for dizziness and headaches.      Social History   Socioeconomic History  . Marital status: Married    Spouse name: Not on file  . Number of children: Not on file  . Years of education: Not on file  . Highest education level: Not on  file  Occupational History  . Not on file  Tobacco Use  . Smoking status: Never Smoker  . Smokeless tobacco: Never Used  Substance and Sexual Activity  . Alcohol use: No  . Drug use: No  . Sexual activity: Yes  Other Topics Concern  . Not on file  Social History Narrative  . Not on file   Social Determinants of Health   Financial Resource Strain:   . Difficulty of Paying Living Expenses: Not on file  Food Insecurity:   . Worried About Charity fundraiser in the Last Year: Not on file  . Ran Out of Food in the Last Year: Not on file  Transportation Needs:   . Lack of Transportation (Medical): Not on file  . Lack of Transportation (Non-Medical): Not on file  Physical Activity:   . Days of Exercise per Week: Not on file  . Minutes of Exercise per Session: Not on file  Stress:   . Feeling of Stress : Not on file  Social Connections:   . Frequency of Communication with Friends and Family: Not on file  . Frequency of Social Gatherings with Friends and Family: Not on file  . Attends Religious Services: Not on file  . Active Member of Clubs or Organizations: Not on file  . Attends Archivist Meetings: Not on file  . Marital Status: Not on file     Observations/Objective: Appears well in NAD   Assessment  and Plan:  See Problem List for Assessment and Plan of chronic medical problems.   Follow Up Instructions:    I discussed the assessment and treatment plan with the patient. The patient was provided an opportunity to ask questions and all were answered. The patient agreed with the plan and demonstrated an understanding of the instructions.   The patient was advised to call back or seek an in-person evaluation if the symptoms worsen or if the condition fails to improve as anticipated.    Binnie Rail, MD

## 2019-07-28 ENCOUNTER — Ambulatory Visit (INDEPENDENT_AMBULATORY_CARE_PROVIDER_SITE_OTHER): Payer: 59 | Admitting: Internal Medicine

## 2019-07-28 ENCOUNTER — Encounter: Payer: Self-pay | Admitting: Internal Medicine

## 2019-07-28 DIAGNOSIS — F419 Anxiety disorder, unspecified: Secondary | ICD-10-CM

## 2019-07-28 DIAGNOSIS — F32A Depression, unspecified: Secondary | ICD-10-CM

## 2019-07-28 DIAGNOSIS — F329 Major depressive disorder, single episode, unspecified: Secondary | ICD-10-CM | POA: Diagnosis not present

## 2019-07-28 DIAGNOSIS — I10 Essential (primary) hypertension: Secondary | ICD-10-CM

## 2019-07-28 DIAGNOSIS — E039 Hypothyroidism, unspecified: Secondary | ICD-10-CM

## 2019-07-30 NOTE — Assessment & Plan Note (Signed)
Chronic Controlled, stable Continue current dose of medication - effexor 150 mg daily

## 2019-07-30 NOTE — Assessment & Plan Note (Signed)
BP Readings from Last 3 Encounters:  07/21/19 135/83  09/26/15 (!) 145/69  09/13/15 126/75   Chronic BP well controlled Current regimen effective and well tolerated Continue current medications at current doses

## 2019-07-30 NOTE — Assessment & Plan Note (Signed)
Chronic  Clinically euthyroid Continue current dose of medication 

## 2019-08-09 ENCOUNTER — Encounter: Payer: Self-pay | Admitting: General Surgery

## 2019-08-14 ENCOUNTER — Other Ambulatory Visit: Payer: Self-pay | Admitting: Internal Medicine

## 2019-08-14 ENCOUNTER — Telehealth: Payer: Self-pay

## 2019-08-14 MED ORDER — LEVOTHYROXINE SODIUM 100 MCG PO TABS: 100.0000 ug | ORAL_TABLET | Freq: Every day | ORAL | 0 refills | Status: DC

## 2019-08-14 NOTE — Telephone Encounter (Signed)
Left message for patient to return call to office. 

## 2019-09-01 ENCOUNTER — Encounter: Payer: Self-pay | Admitting: General Surgery

## 2019-09-18 ENCOUNTER — Encounter: Payer: Self-pay | Admitting: Internal Medicine

## 2019-09-19 MED ORDER — PHENTERMINE HCL 30 MG PO CAPS: 30.0000 mg | ORAL_CAPSULE | ORAL | 2 refills | Status: DC

## 2019-11-03 DIAGNOSIS — Z1389 Encounter for screening for other disorder: Secondary | ICD-10-CM | POA: Diagnosis not present

## 2019-11-03 DIAGNOSIS — Z130 Encounter for screening for diseases of the blood and blood-forming organs and certain disorders involving the immune mechanism: Secondary | ICD-10-CM | POA: Diagnosis not present

## 2019-11-03 DIAGNOSIS — Z6838 Body mass index (BMI) 38.0-38.9, adult: Secondary | ICD-10-CM | POA: Diagnosis not present

## 2019-11-03 DIAGNOSIS — Z01419 Encounter for gynecological examination (general) (routine) without abnormal findings: Secondary | ICD-10-CM | POA: Diagnosis not present

## 2019-11-03 DIAGNOSIS — Z124 Encounter for screening for malignant neoplasm of cervix: Secondary | ICD-10-CM | POA: Diagnosis not present

## 2019-11-03 DIAGNOSIS — Z309 Encounter for contraceptive management, unspecified: Secondary | ICD-10-CM | POA: Diagnosis not present

## 2019-11-21 ENCOUNTER — Other Ambulatory Visit: Payer: Self-pay | Admitting: Internal Medicine

## 2019-11-23 ENCOUNTER — Telehealth: Payer: 59 | Admitting: Physician Assistant

## 2019-11-23 DIAGNOSIS — R42 Dizziness and giddiness: Secondary | ICD-10-CM

## 2019-11-23 NOTE — Progress Notes (Signed)
Based on what you shared with me, I feel your condition warrants further evaluation and I recommend that you be seen for a face to face office visit.  Many causes of dizziness are not dangerous, but a physical exam is needed to ensure that there is nothing more serious going on.    NOTE: If you entered your credit card information for this eVisit, you will not be charged. You may see a "hold" on your card for the $35 but that hold will drop off and you will not have a charge processed.   If you are having a true medical emergency please call 911.      For an urgent face to face visit, [facility] has five urgent care centers for your convenience:      NEW:  [facility] [place] [facility] [place] Get Driving Directions [id number] [address] [place], [place] [address] . 10 am - 6pm Monday - Friday    [facility]) Get Driving Directions [id number] [address] [place], [place] [address] . 10 am to 8 pm Monday-Friday . 12 pm to 8 pm [facility] at [facility] Get Driving Directions [id number] [place], [place], [place] [address] . 8 am to 8 pm Monday-Friday . 9 am to 6 pm Saturday . 11 am to 6 pm Sunday     [facility] at [facility] Get Driving Directions  [id number] [address], [place] [address] . 8 am to 8 pm Monday-Friday . 8 am to 4 pm [facility] Urgent Care at [place] Get Driving Directions [id number] [address] [place], [place] [address] . 12 pm to 6 pm Monday-Friday      Your e-visit answers were reviewed by a board certified advanced clinical practitioner to complete your personal care plan.  Thank you for using e-Visits.   Greater than 5 minutes, yet less than 10 minutes of time have been spent researching, coordinating, and implementing care for this patient  today

## 2020-01-18 DIAGNOSIS — R7303 Prediabetes: Secondary | ICD-10-CM | POA: Insufficient documentation

## 2020-01-18 DIAGNOSIS — R739 Hyperglycemia, unspecified: Secondary | ICD-10-CM | POA: Insufficient documentation

## 2020-01-18 NOTE — Progress Notes (Signed)
Subjective:    Patient ID: Cheryl Hampton, female    DOB: 07/11/1985, 34 y.o.   MRN: [id number]  HPI She is here for a physical exam.     Medications and allergies reviewed with patient and updated if appropriate.  Patient Active Problem List   Diagnosis Date Noted  . Empyema of gallbladder   . Acute cholecystitis 07/17/2019  . Hypomagnesemia   . Hypophosphatemia   . Wrist pain, acute, right 06/28/2019  . Essential hypertension 04/24/2019  . Anxiety 01/13/2019  . Menorrhagia 03/21/2018  . Sciatica of right side 02/05/2017  . 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 Nonallopathic lesion of sacral region 07/24/2016  . Nonallopathic lesion of thoracic region 07/24/2016  . Nonallopathic lesion of lumbosacral region 07/24/2016  . Chronic midline low back pain without sciatica 06/12/2016  . Hair loss 06/12/2016  . Hypothyroidism 06/12/2016  . Obesity 01/09/2013  . Depression   . Migraine headache 02/06/2011    Current Outpatient Medications on File Prior to Visit  Medication Sig Dispense Refill  . butalbital-acetaminophen-caffeine (FIORICET, ESGIC) 50-325-40 MG tablet Take 1 tablet by mouth every 4 (four) hours as needed for headache. 90 tablet 0  . levothyroxine (SYNTHROID) 100 MCG tablet TAKE 1 TABLET BY MOUTH ONCE DAILY 90 tablet 0  . losartan-hydrochlorothiazide (HYZAAR) 50-12.5 MG tablet Take 1 tablet by mouth daily. 90 tablet 3  . norgestimate-ethinyl estradiol (ORTHO-CYCLEN) 0.25-35 MG-MCG tablet Take 1 tablet by mouth daily.    . ondansetron (ZOFRAN) 4 MG tablet Take 1 tablet (4 mg total) by mouth every 8 (eight) hours as needed for nausea or vomiting. 20 tablet 1  . oxyCODONE-acetaminophen (PERCOCET) 7.5-325 MG tablet Take 1 tablet by mouth every 6 (six) hours as needed. 25 tablet 0  . phentermine 30 MG capsule Take 1 capsule (30 mg total) by mouth every morning. 30 capsule 2  . venlafaxine XR (EFFEXOR-XR) 150 MG 24 hr capsule TAKE 1 CAPSULE (150 MG  TOTAL) BY MOUTH DAILY WITH BREAKFAST. 90 capsule 0   No current facility-administered medications on file prior to visit.    Past Medical History:  Diagnosis Date  . Depression    Post partum took lexapro and wellbutrin and it helped  . Hypothyroid 2012 dx   post partum hyperthyroid  . Migraine headache     Past Surgical History:  Procedure Laterality Date  . CHOLECYSTECTOMY N/A 07/18/2019   Procedure: LAPAROSCOPIC CHOLECYSTECTOMY;  Surgeon: Aviva Signs, MD;  Location: [facility];  Service: General;  Laterality: N/A;  . MOUTH SURGERY    . Removal (R) Fallopian tube  09/2009    Social History   Socioeconomic History  . Marital status: Married    Spouse name: Not on file  . Number of children: Not on file  . Years of education: Not on file  . Highest education level: Not on file  Occupational History  . Not on file  Tobacco Use  . Smoking status: Never Smoker  . Smokeless tobacco: Never Used  Substance and Sexual Activity  . Alcohol use: No  . Drug use: No  . Sexual activity: Yes  Other Topics Concern  . Not on file  Social History Narrative  . Not on file   Social Determinants of Health   Financial Resource Strain:   . Difficulty of Paying Living Expenses:   Food Insecurity:   . Worried About Charity fundraiser in the Last Year:   . [place] in the Last Year:  Transportation Needs:   . Film/video editor (Medical):   Marland Kitchen Lack of Transportation (Non-Medical):   Physical Activity:   . Days of Exercise per Week:   . Minutes of Exercise per Session:   Stress:   . Feeling of Stress :   Social Connections:   . Frequency of Communication with Friends and Family:   . Frequency of Social Gatherings with Friends and Family:   . Attends Religious Services:   . Active Member of Clubs or Organizations:   . Attends Archivist Meetings:   Marland Kitchen Marital Status:     Family History  Problem Relation Age of Onset  . Alcohol abuse Maternal Uncle   .  Pancreatic cancer Maternal Grandmother   . Stroke Maternal Grandfather   . Hypertension Mother   . Diabetes Mother   . Hyperlipidemia Mother   . Thyroid disease Neg Hx     Review of Systems     Objective:  There were no vitals filed for this visit. There were no vitals filed for this visit. There is no height or weight on file to calculate BMI.  BP Readings from Last 3 Encounters:  07/21/19 135/83  09/26/15 (!) 145/69  09/13/15 126/75    Wt Readings from Last 3 Encounters:  07/18/19 242 lb 8.1 oz (110 kg)  09/23/15 247 lb (112 kg)  09/13/15 244 lb 12.8 oz (111 kg)     Physical Exam Constitutional: She appears well-developed and well-nourished. No distress.  HENT:  Head: Normocephalic and atraumatic.  Right Ear: External ear normal. Normal ear canal and TM Left Ear: External ear normal.  Normal ear canal and TM Mouth/Throat: Oropharynx is clear and moist.  Eyes: Conjunctivae and EOM are normal.  Neck: Neck supple. No tracheal deviation present. No thyromegaly present.  No carotid bruit  Cardiovascular: Normal rate, regular rhythm and normal heart sounds.   No murmur heard.  No edema. Pulmonary/Chest: Effort normal and breath sounds normal. No respiratory distress. She has no wheezes. She has no rales.  Breast: deferred   Abdominal: Soft. She exhibits no distension. There is no tenderness.  Lymphadenopathy: She has no cervical adenopathy.  Skin: Skin is warm and dry. She is not diaphoretic.  Psychiatric: She has a normal mood and affect. Her behavior is normal.        Assessment & Plan:   Physical exam: Screening blood work    ordered Immunizations  ? Covid, td up to date Gyn    Exercise   Weight   Substance abuse  none  See Problem List for Assessment and Plan of chronic medical problems.   This visit occurred during the SARS-CoV-2 public health emergency.  Safety protocols were in place, including screening questions prior to the visit, additional usage  of staff PPE, and extensive cleaning of exam room while observing appropriate contact time as indicated for disinfecting solutions.    This encounter was created in error - please disregard.

## 2020-01-18 NOTE — Patient Instructions (Signed)
Blood work was ordered.    All other Health Maintenance issues reviewed.   All recommended immunizations and age-appropriate screenings are up-to-date or discussed.  No immunization administered today.   Medications reviewed and updated.  Changes include :     Your prescription(s) have been submitted to your pharmacy. Please take as directed and contact our office if you believe you are having problem(s) with the medication(s).  A referral was ordered for      Someone will call you to schedule an appointment.    Please followup in 6 months    Health Maintenance, Female Adopting a healthy lifestyle and getting preventive care are important in promoting health and wellness. Ask your health care provider about:  The right schedule for you to have regular tests and exams.  Things you can do on your own to prevent diseases and keep yourself healthy. What should I know about diet, weight, and exercise? Eat a healthy diet   Eat a diet that includes plenty of vegetables, fruits, low-fat dairy products, and lean protein.  Do not eat a lot of foods that are high in solid fats, added sugars, or sodium. Maintain a healthy weight Body mass index (BMI) is used to identify weight problems. It estimates body fat based on height and weight. Your health care provider can help determine your BMI and help you achieve or maintain a healthy weight. Get regular exercise Get regular exercise. This is one of the most important things you can do for your health. Most adults should:  Exercise for at least 150 minutes each week. The exercise should increase your heart rate and make you sweat (moderate-intensity exercise).  Do strengthening exercises at least twice a week. This is in addition to the moderate-intensity exercise.  Spend less time sitting. Even light physical activity can be beneficial. Watch cholesterol and blood lipids Have your blood tested for lipids and cholesterol at 33 years of  age, then have this test every 5 years. Have your cholesterol levels checked more often if:  Your lipid or cholesterol levels are high.  You are older than 33 years of age.  You are at high risk for heart disease. What should I know about cancer screening? Depending on your health history and family history, you may need to have cancer screening at various ages. This may include screening for:  Breast cancer.  Cervical cancer.  Colorectal cancer.  Skin cancer.  Lung cancer. What should I know about heart disease, diabetes, and high blood pressure? Blood pressure and heart disease  High blood pressure causes heart disease and increases the risk of stroke. This is more likely to develop in people who have high blood pressure readings, are of African descent, or are overweight.  Have your blood pressure checked: ? Every 3-5 years if you are 18-39 years of age. ? Every year if you are 40 years old or older. Diabetes Have regular diabetes screenings. This checks your fasting blood sugar level. Have the screening done:  Once every three years after age 40 if you are at a normal weight and have a low risk for diabetes.  More often and at a younger age if you are overweight or have a high risk for diabetes. What should I know about preventing infection? Hepatitis B If you have a higher risk for hepatitis B, you should be screened for this virus. Talk with your health care provider to find out if you are at risk for hepatitis B infection. Hepatitis C   Testing is recommended for:  Everyone born from 1945 through 1965.  Anyone with known risk factors for hepatitis C. Sexually transmitted infections (STIs)  Get screened for STIs, including gonorrhea and chlamydia, if: ? You are sexually active and are younger than 33 years of age. ? You are older than 33 years of age and your health care provider tells you that you are at risk for this type of infection. ? Your sexual activity has  changed since you were last screened, and you are at increased risk for chlamydia or gonorrhea. Ask your health care provider if you are at risk.  Ask your health care provider about whether you are at high risk for HIV. Your health care provider may recommend a prescription medicine to help prevent HIV infection. If you choose to take medicine to prevent HIV, you should first get tested for HIV. You should then be tested every 3 months for as long as you are taking the medicine. Pregnancy  If you are about to stop having your period (premenopausal) and you may become pregnant, seek counseling before you get pregnant.  Take 400 to 800 micrograms (mcg) of folic acid every day if you become pregnant.  Ask for birth control (contraception) if you want to prevent pregnancy. Osteoporosis and menopause Osteoporosis is a disease in which the bones lose minerals and strength with aging. This can result in bone fractures. If you are 65 years old or older, or if you are at risk for osteoporosis and fractures, ask your health care provider if you should:  Be screened for bone loss.  Take a calcium or vitamin D supplement to lower your risk of fractures.  Be given hormone replacement therapy (HRT) to treat symptoms of menopause. Follow these instructions at home: Lifestyle  Do not use any products that contain nicotine or tobacco, such as cigarettes, e-cigarettes, and chewing tobacco. If you need help quitting, ask your health care provider.  Do not use street drugs.  Do not share needles.  Ask your health care provider for help if you need support or information about quitting drugs. Alcohol use  Do not drink alcohol if: ? Your health care provider tells you not to drink. ? You are pregnant, may be pregnant, or are planning to become pregnant.  If you drink alcohol: ? Limit how much you use to 0-1 drink a day. ? Limit intake if you are breastfeeding.  Be aware of how much alcohol is in  your drink. In the [place] one drink equals one 12 oz bottle of beer (355 mL), one 5 oz glass of wine (148 mL), or one 1 oz glass of hard liquor (44 mL). General instructions  Schedule regular health, dental, and eye exams.  Stay current with your vaccines.  Tell your health care provider if: ? You often feel depressed. ? You have ever been abused or do not feel safe at home. Summary  Adopting a healthy lifestyle and getting preventive care are important in promoting health and wellness.  Follow your health care provider's instructions about healthy diet, exercising, and getting tested or screened for diseases.  Follow your health care provider's instructions on monitoring your cholesterol and blood pressure. This information is not intended to replace advice given to you by your health care provider. Make sure you discuss any questions you have with your health care provider. Document Revised: 06/01/2018 Document Reviewed: 06/01/2018 Elsevier Patient Education  2020 [employer]  

## 2020-01-19 ENCOUNTER — Encounter: Payer: Self-pay | Admitting: Internal Medicine

## 2020-01-19 ENCOUNTER — Encounter: Payer: 59 | Admitting: Internal Medicine

## 2020-02-05 DIAGNOSIS — Z3483 Encounter for supervision of other normal pregnancy, third trimester: Secondary | ICD-10-CM | POA: Diagnosis not present

## 2020-02-05 DIAGNOSIS — Z3A36 36 weeks gestation of pregnancy: Secondary | ICD-10-CM | POA: Diagnosis not present

## 2020-02-07 DIAGNOSIS — Z30430 Encounter for insertion of intrauterine contraceptive device: Secondary | ICD-10-CM | POA: Diagnosis not present

## 2020-02-07 DIAGNOSIS — Z3009 Encounter for other general counseling and advice on contraception: Secondary | ICD-10-CM | POA: Diagnosis not present

## 2020-02-15 ENCOUNTER — Emergency Department (HOSPITAL_COMMUNITY)
Admission: EM | Admit: 2020-02-15 | Discharge: 2020-02-15 | Disposition: A | Discharge status: Home / Self Care | Payer: 59 | Attending: Emergency Medicine | Admitting: Emergency Medicine

## 2020-02-15 ENCOUNTER — Emergency Department (HOSPITAL_COMMUNITY): Payer: 59

## 2020-02-15 ENCOUNTER — Other Ambulatory Visit: Payer: Self-pay

## 2020-02-15 DIAGNOSIS — Z79899 Other long term (current) drug therapy: Secondary | ICD-10-CM | POA: Insufficient documentation

## 2020-02-15 DIAGNOSIS — R609 Edema, unspecified: Secondary | ICD-10-CM | POA: Diagnosis not present

## 2020-02-15 DIAGNOSIS — Y939 Activity, unspecified: Secondary | ICD-10-CM | POA: Diagnosis not present

## 2020-02-15 DIAGNOSIS — R1032 Left lower quadrant pain: Secondary | ICD-10-CM | POA: Diagnosis not present

## 2020-02-15 DIAGNOSIS — R519 Headache, unspecified: Secondary | ICD-10-CM | POA: Diagnosis not present

## 2020-02-15 DIAGNOSIS — S0081XA Abrasion of other part of head, initial encounter: Secondary | ICD-10-CM | POA: Diagnosis not present

## 2020-02-15 DIAGNOSIS — S0003XA Contusion of scalp, initial encounter: Secondary | ICD-10-CM | POA: Diagnosis not present

## 2020-02-15 DIAGNOSIS — M549 Dorsalgia, unspecified: Secondary | ICD-10-CM | POA: Diagnosis not present

## 2020-02-15 DIAGNOSIS — T1490XA Injury, unspecified, initial encounter: Secondary | ICD-10-CM

## 2020-02-15 DIAGNOSIS — Y929 Unspecified place or not applicable: Secondary | ICD-10-CM | POA: Diagnosis not present

## 2020-02-15 DIAGNOSIS — M7989 Other specified soft tissue disorders: Secondary | ICD-10-CM | POA: Diagnosis not present

## 2020-02-15 DIAGNOSIS — S3992XA Unspecified injury of lower back, initial encounter: Secondary | ICD-10-CM | POA: Diagnosis not present

## 2020-02-15 DIAGNOSIS — S3991XA Unspecified injury of abdomen, initial encounter: Secondary | ICD-10-CM | POA: Diagnosis not present

## 2020-02-15 DIAGNOSIS — I10 Essential (primary) hypertension: Secondary | ICD-10-CM | POA: Insufficient documentation

## 2020-02-15 DIAGNOSIS — S0091XA Abrasion of unspecified part of head, initial encounter: Secondary | ICD-10-CM | POA: Diagnosis not present

## 2020-02-15 DIAGNOSIS — E039 Hypothyroidism, unspecified: Secondary | ICD-10-CM | POA: Diagnosis not present

## 2020-02-15 DIAGNOSIS — S6992XA Unspecified injury of left wrist, hand and finger(s), initial encounter: Secondary | ICD-10-CM | POA: Diagnosis not present

## 2020-02-15 DIAGNOSIS — S0993XA Unspecified injury of face, initial encounter: Secondary | ICD-10-CM | POA: Diagnosis not present

## 2020-02-15 DIAGNOSIS — S199XXA Unspecified injury of neck, initial encounter: Secondary | ICD-10-CM | POA: Diagnosis not present

## 2020-02-15 DIAGNOSIS — R0689 Other abnormalities of breathing: Secondary | ICD-10-CM | POA: Diagnosis not present

## 2020-02-15 DIAGNOSIS — Y999 Unspecified external cause status: Secondary | ICD-10-CM | POA: Insufficient documentation

## 2020-02-15 DIAGNOSIS — M79642 Pain in left hand: Secondary | ICD-10-CM | POA: Diagnosis not present

## 2020-02-15 DIAGNOSIS — S22089A Unspecified fracture of T11-T12 vertebra, initial encounter for closed fracture: Secondary | ICD-10-CM | POA: Diagnosis not present

## 2020-02-15 DIAGNOSIS — R42 Dizziness and giddiness: Secondary | ICD-10-CM | POA: Diagnosis not present

## 2020-02-15 DIAGNOSIS — S299XXA Unspecified injury of thorax, initial encounter: Secondary | ICD-10-CM | POA: Diagnosis not present

## 2020-02-15 LAB — CBC
HCT: 42.2 % (ref 36.0–46.0)
Hemoglobin: 13.5 g/dL (ref 12.0–15.0)
MCH: 30.8 pg (ref 26.0–34.0)
MCHC: 32 g/dL (ref 30.0–36.0)
MCV: 96.3 fL (ref 80.0–100.0)
Platelets: 434 10*3/uL — ABNORMAL HIGH (ref 150–400)
RBC: 4.38 MIL/uL (ref 3.87–5.11)
RDW: 13.2 % (ref 11.5–15.5)
WBC: 7.8 10*3/uL (ref 4.0–10.5)
nRBC: 0 % (ref 0.0–0.2)

## 2020-02-15 LAB — COMPREHENSIVE METABOLIC PANEL
ALT: 31 U/L (ref 0–44)
AST: 28 U/L (ref 15–41)
Albumin: 3.7 g/dL (ref 3.5–5.0)
Alkaline Phosphatase: 48 U/L (ref 38–126)
Anion gap: 11 (ref 5–15)
BUN: 12 mg/dL (ref 6–20)
CO2: 25 mmol/L (ref 22–32)
Calcium: 9.8 mg/dL (ref 8.9–10.3)
Chloride: 102 mmol/L (ref 98–111)
Creatinine, Ser: 0.72 mg/dL (ref 0.44–1.00)
GFR calc Af Amer: >60 mL/min (ref >60)
GFR calc non Af Amer: >60 mL/min (ref >60)
Glucose, Bld: 97 mg/dL (ref 70–99)
Potassium: 4.4 mmol/L (ref 3.5–5.1)
Sodium: 138 mmol/L (ref 135–145)
Total Bilirubin: 0.5 mg/dL (ref 0.3–1.2)
Total Protein: 7.5 g/dL (ref 6.5–8.1)

## 2020-02-15 LAB — PROTIME-INR
INR: 0.9 (ref 0.8–1.2)
Prothrombin Time: 11.7 seconds (ref 11.4–15.2)

## 2020-02-15 LAB — I-STAT CHEM 8, ED
BUN: 14 mg/dL (ref 6–20)
Calcium, Ion: 1.25 mmol/L (ref 1.15–1.40)
Chloride: 105 mmol/L (ref 98–111)
Creatinine, Ser: 0.7 mg/dL (ref 0.44–1.00)
Glucose, Bld: 94 mg/dL (ref 70–99)
HCT: 39 % (ref 36.0–46.0)
Hemoglobin: 13.3 g/dL (ref 12.0–15.0)
Potassium: 4.9 mmol/L (ref 3.5–5.1)
Sodium: 139 mmol/L (ref 135–145)
TCO2: 26 mmol/L (ref 22–32)

## 2020-02-15 LAB — ETHANOL: Alcohol, Ethyl (B): <10 mg/dL (ref <10)

## 2020-02-15 LAB — LACTIC ACID, PLASMA: Lactic Acid, Venous: 1.8 mmol/L (ref 0.5–1.9)

## 2020-02-15 LAB — I-STAT BETA HCG BLOOD, ED (MC, WL, AP ONLY): I-stat hCG, quantitative: <5 m[IU]/mL (ref <5)

## 2020-02-15 LAB — SAMPLE TO BLOOD BANK

## 2020-02-15 MED ORDER — ONDANSETRON HCL 4 MG PO TABS: 4.0000 mg | ORAL_TABLET | Freq: Three times a day (TID) | ORAL | 0 refills | Status: DC | PRN

## 2020-02-15 MED ORDER — CEPHALEXIN 500 MG PO CAPS: 500.0000 mg | ORAL_CAPSULE | Freq: Two times a day (BID) | ORAL | 0 refills | Status: AC

## 2020-02-15 MED ORDER — MORPHINE SULFATE (PF) 4 MG/ML IV SOLN
4.0000 mg | Freq: Once | INTRAVENOUS | Status: AC
  Administered 2020-02-15: 4 mg via INTRAVENOUS
  Filled 2020-02-15: qty 1

## 2020-02-15 MED ORDER — ONDANSETRON HCL 4 MG/2ML IJ SOLN
4.0000 mg | Freq: Once | INTRAMUSCULAR | Status: AC
  Administered 2020-02-15: 4 mg via INTRAVENOUS
  Filled 2020-02-15: qty 2

## 2020-02-15 MED ORDER — IOHEXOL 300 MG/ML  SOLN
100.0000 mL | Freq: Once | INTRAMUSCULAR | Status: AC | PRN
  Administered 2020-02-15: 100 mL via INTRAVENOUS

## 2020-02-15 NOTE — ED Notes (Signed)
Pt transported to Ct.
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
## 2020-02-16 ENCOUNTER — Encounter: Payer: 59 | Admitting: Internal Medicine

## 2020-02-20 ENCOUNTER — Other Ambulatory Visit: Payer: Self-pay | Admitting: Internal Medicine

## 2020-03-22 DIAGNOSIS — Z3009 Encounter for other general counseling and advice on contraception: Secondary | ICD-10-CM | POA: Diagnosis not present

## 2020-03-22 DIAGNOSIS — Z30431 Encounter for routine checking of intrauterine contraceptive device: Secondary | ICD-10-CM | POA: Diagnosis not present

## 2020-04-07 NOTE — Progress Notes (Signed)
Subjective:    Patient ID: Cheryl Hampton, female    DOB: 11/06/1985, 34 y.o.   MRN: [id number]  HPI The patient is here for follow up of their chronic medical problems, including migraines, htn, hypothyroidism, depression, anxiety    Medications and allergies reviewed with patient and updated if appropriate.  Patient Active Problem List   Diagnosis Date Noted  . Hyperglycemia 01/18/2020  . Empyema of gallbladder   . Acute cholecystitis 07/17/2019  . Hypomagnesemia   . Hypophosphatemia   . Wrist pain, acute, right 06/28/2019  . Essential hypertension 04/24/2019  . Anxiety 01/13/2019  . Menorrhagia 03/21/2018  . Sciatica of right side 02/05/2017  . 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 Nonallopathic lesion of sacral region 07/24/2016  . Nonallopathic lesion of thoracic region 07/24/2016  . Nonallopathic lesion of lumbosacral region 07/24/2016  . Chronic midline low back pain without sciatica 06/12/2016  . Hair loss 06/12/2016  . Hypothyroidism 06/12/2016  . Obesity 01/09/2013  . Depression   . Migraine headache 02/06/2011    Current Outpatient Medications on File Prior to Visit  Medication Sig Dispense Refill  . butalbital-acetaminophen-caffeine (FIORICET, ESGIC) 50-325-40 MG tablet Take 1 tablet by mouth every 4 (four) hours as needed for headache. 90 tablet 0  . levothyroxine (SYNTHROID) 100 MCG tablet TAKE 1 TABLET BY MOUTH DAILY 90 tablet 2  . losartan-hydrochlorothiazide (HYZAAR) 50-12.5 MG tablet Take 1 tablet by mouth daily. 90 tablet 3  . norgestimate-ethinyl estradiol (ORTHO-CYCLEN) 0.25-35 MG-MCG tablet Take 1 tablet by mouth daily.    . ondansetron (ZOFRAN) 4 MG tablet Take 1 tablet (4 mg total) by mouth every 8 (eight) hours as needed for nausea or vomiting. 20 tablet 1  . ondansetron (ZOFRAN) 4 MG tablet Take 1 tablet (4 mg total) by mouth every 8 (eight) hours as needed for up to 15 doses for nausea or vomiting. 15 tablet 0  .  oxyCODONE-acetaminophen (PERCOCET) 7.5-325 MG tablet Take 1 tablet by mouth every 6 (six) hours as needed. 25 tablet 0  . phentermine 30 MG capsule Take 1 capsule (30 mg total) by mouth every morning. 30 capsule 2  . venlafaxine XR (EFFEXOR-XR) 150 MG 24 hr capsule TAKE 1 CAPSULE BY MOUTH DAILY WITH BREAKFAST 90 capsule 2   No current facility-administered medications on file prior to visit.    Past Medical History:  Diagnosis Date  . Depression    Post partum took lexapro and wellbutrin and it helped  . Hypothyroid 2012 dx   post partum hyperthyroid  . Migraine headache     Past Surgical History:  Procedure Laterality Date  . CHOLECYSTECTOMY N/A 07/18/2019   Procedure: LAPAROSCOPIC CHOLECYSTECTOMY;  Surgeon: Aviva Signs, MD;  Location: [facility];  Service: General;  Laterality: N/A;  . MOUTH SURGERY    . Removal (R) Fallopian tube  09/2009    Social History   Socioeconomic History  . Marital status: Married    Spouse name: Not on file  . Number of children: Not on file  . Years of education: Not on file  . Highest education level: Not on file  Occupational History  . Not on file  Tobacco Use  . Smoking status: Never Smoker  . Smokeless tobacco: Never Used  Substance and Sexual Activity  . Alcohol use: No  . Drug use: No  . Sexual activity: Yes  Other Topics Concern  . Not on file  Social History Narrative  . Not on file   Social Determinants  of Health   Financial Resource Strain:   . Difficulty of Paying Living Expenses: Not on file  Food Insecurity:   . Worried About Charity fundraiser in the Last Year: Not on file  . Ran Out of Food in the Last Year: Not on file  Transportation Needs:   . Lack of Transportation (Medical): Not on file  . Lack of Transportation (Non-Medical): Not on file  Physical Activity:   . Days of Exercise per Week: Not on file  . Minutes of Exercise per Session: Not on file  Stress:   . Feeling of Stress : Not on file  Social  Connections:   . Frequency of Communication with Friends and Family: Not on file  . Frequency of Social Gatherings with Friends and Family: Not on file  . Attends Religious Services: Not on file  . Active Member of Clubs or Organizations: Not on file  . Attends Archivist Meetings: Not on file  . Marital Status: Not on file    Family History  Problem Relation Age of Onset  . Alcohol abuse Maternal Uncle   . Pancreatic cancer Maternal Grandmother   . Stroke Maternal Grandfather   . Hypertension Mother   . Diabetes Mother   . Hyperlipidemia Mother   . Thyroid disease Neg Hx     Review of Systems     Objective:  There were no vitals filed for this visit. BP Readings from Last 3 Encounters:  02/15/20 (!) 112/58  07/21/19 135/83  09/26/15 (!) 145/69   Wt Readings from Last 3 Encounters:  02/15/20 229 lb (103.9 kg)  07/18/19 242 lb 8.1 oz (110 kg)  09/23/15 247 lb (112 kg)   There is no height or weight on file to calculate BMI.   Physical Exam    Constitutional: Appears well-developed and well-nourished. No distress.  HENT:  Head: Normocephalic and atraumatic.  Neck: Neck supple. No tracheal deviation present. No thyromegaly present.  No cervical lymphadenopathy Cardiovascular: Normal rate, regular rhythm and normal heart sounds.   No murmur heard. No carotid bruit .  No edema Pulmonary/Chest: Effort normal and breath sounds normal. No respiratory distress. No has no wheezes. No rales.  Skin: Skin is warm and dry. Not diaphoretic.  Psychiatric: Normal mood and affect. Behavior is normal.      Assessment & Plan:    See Problem List for Assessment and Plan of chronic medical problems.    This visit occurred during the SARS-CoV-2 public health emergency.  Safety protocols were in place, including screening questions prior to the visit, additional usage of staff PPE, and extensive cleaning of exam room while observing appropriate contact time as indicated  for disinfecting solutions.    This encounter was created in error - please disregard.

## 2020-04-07 NOTE — Patient Instructions (Signed)
  Blood work was ordered.     Medications reviewed and updated.  Changes include :     Your prescription(s) have been submitted to your pharmacy. Please take as directed and contact our office if you believe you are having problem(s) with the medication(s).  A referral was ordered for        Someone from their office will call you to schedule an appointment.    Please followup in 6 months   

## 2020-04-08 ENCOUNTER — Encounter: Payer: 59 | Admitting: Internal Medicine

## 2020-04-08 DIAGNOSIS — F3289 Other specified depressive episodes: Secondary | ICD-10-CM

## 2020-04-08 DIAGNOSIS — I10 Essential (primary) hypertension: Secondary | ICD-10-CM

## 2020-04-08 DIAGNOSIS — F419 Anxiety disorder, unspecified: Secondary | ICD-10-CM

## 2020-04-08 DIAGNOSIS — E039 Hypothyroidism, unspecified: Secondary | ICD-10-CM

## 2020-04-08 DIAGNOSIS — G43809 Other migraine, not intractable, without status migrainosus: Secondary | ICD-10-CM

## 2020-04-21 NOTE — Patient Instructions (Addendum)
  Blood work was ordered.     Medications reviewed and updated.  Changes include :   wegovy for weight loss  Your prescription(s) have been submitted to your pharmacy. Please take as directed and contact our office if you believe you are having problem(s) with the medication(s).   Please followup in 6 months
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
## 2020-04-22 ENCOUNTER — Ambulatory Visit: Payer: 59 | Admitting: Internal Medicine

## 2020-04-22 ENCOUNTER — Encounter: Payer: Self-pay | Admitting: Internal Medicine

## 2020-04-22 ENCOUNTER — Other Ambulatory Visit: Payer: Self-pay

## 2020-04-22 ENCOUNTER — Other Ambulatory Visit: Payer: Self-pay | Admitting: Internal Medicine

## 2020-04-22 VITALS — BP 116/72 | HR 86 | Temp 98.0°F | Ht 64.0 in | Wt 237.0 lb

## 2020-04-22 DIAGNOSIS — F419 Anxiety disorder, unspecified: Secondary | ICD-10-CM

## 2020-04-22 DIAGNOSIS — E039 Hypothyroidism, unspecified: Secondary | ICD-10-CM

## 2020-04-22 DIAGNOSIS — F3289 Other specified depressive episodes: Secondary | ICD-10-CM

## 2020-04-22 DIAGNOSIS — Z6841 Body Mass Index (BMI) 40.0 and over, adult: Secondary | ICD-10-CM | POA: Diagnosis not present

## 2020-04-22 DIAGNOSIS — Z1159 Encounter for screening for other viral diseases: Secondary | ICD-10-CM

## 2020-04-22 DIAGNOSIS — I10 Essential (primary) hypertension: Secondary | ICD-10-CM | POA: Diagnosis not present

## 2020-04-22 DIAGNOSIS — G43809 Other migraine, not intractable, without status migrainosus: Secondary | ICD-10-CM

## 2020-04-22 LAB — COMPREHENSIVE METABOLIC PANEL
ALT: 36 U/L — ABNORMAL HIGH (ref 0–35)
AST: 24 U/L (ref 0–37)
Albumin: 4.1 g/dL (ref 3.5–5.2)
Alkaline Phosphatase: 57 U/L (ref 39–117)
BUN: 8 mg/dL (ref 6–23)
CO2: 28 mEq/L (ref 19–32)
Calcium: 9.1 mg/dL (ref 8.4–10.5)
Chloride: 103 mEq/L (ref 96–112)
Creatinine, Ser: 0.55 mg/dL (ref 0.40–1.20)
GFR: 120.77 mL/min (ref >60.00)
Glucose, Bld: 101 mg/dL — ABNORMAL HIGH (ref 70–99)
Potassium: 3.7 mEq/L (ref 3.5–5.1)
Sodium: 138 mEq/L (ref 135–145)
Total Bilirubin: 0.3 mg/dL (ref 0.2–1.2)
Total Protein: 7.4 g/dL (ref 6.0–8.3)

## 2020-04-22 LAB — T4, FREE: Free T4: 0.57 ng/dL — ABNORMAL LOW (ref 0.60–1.60)

## 2020-04-22 LAB — TSH: TSH: 1.8 u[IU]/mL (ref 0.35–4.50)

## 2020-04-22 MED ORDER — SEMAGLUTIDE-WEIGHT MANAGEMENT 0.25 MG/0.5ML ~~LOC~~ SOAJ: 0.2500 mg | SUBCUTANEOUS | 0 refills | Status: DC

## 2020-04-22 MED ORDER — SEMAGLUTIDE-WEIGHT MANAGEMENT 0.5 MG/0.5ML ~~LOC~~ SOAJ: 0.5000 mg | SUBCUTANEOUS | 0 refills | Status: DC

## 2020-04-22 MED ORDER — LOSARTAN POTASSIUM-HCTZ 50-12.5 MG PO TABS
1.0000 | ORAL_TABLET | Freq: Every day | ORAL | 3 refills | Status: DC
Start: 2020-04-22 — End: 2020-07-29

## 2020-04-22 MED ORDER — VENLAFAXINE HCL ER 75 MG PO CP24: 75.0000 mg | ORAL_CAPSULE | Freq: Every day | ORAL | 1 refills | Status: DC

## 2020-04-22 MED ORDER — VENLAFAXINE HCL ER 150 MG PO CP24
ORAL_CAPSULE | ORAL | 2 refills | Status: DC
Start: 2020-04-22 — End: 2022-01-23

## 2020-04-22 NOTE — Assessment & Plan Note (Signed)
Chronic Occasional migraine - once every few weeks Taking fioricet 50-325-40 mg as needed, continue

## 2020-04-22 NOTE — Assessment & Plan Note (Signed)
Chronic  Clinically euthyroid Currently taking levothyroxine 100 mcg Check tsh  Titrate med dose if needed

## 2020-04-22 NOTE — Assessment & Plan Note (Signed)
Chronic BMI 40.68  Continue regular walking Decrease portions, healthy diet Will try wegovy - titrate every 4 weeks - she will get a savings card online

## 2020-04-22 NOTE — Assessment & Plan Note (Signed)
Chronic Trial of wegovy

## 2020-04-22 NOTE — Assessment & Plan Note (Signed)
Chronic Not ideally controlled Will increase venlafaxine to 225 mg daily

## 2020-04-22 NOTE — Assessment & Plan Note (Signed)
Chronic BP well controlled Continue losartan-hydrochlorothiazide 50-12.5 mg daily cmp

## 2020-04-23 LAB — HEPATITIS C ANTIBODY
Hepatitis C Ab: NONREACTIVE
SIGNAL TO CUT-OFF: 0.01 (ref <1.00)

## 2020-04-29 ENCOUNTER — Encounter: Payer: Self-pay | Admitting: Internal Medicine

## 2020-04-29 ENCOUNTER — Telehealth: Payer: Self-pay

## 2020-04-29 NOTE — Telephone Encounter (Signed)
Key: [id number]  [employer] is reviewing your PA request. You may close this dialog, return to your dashboard, and perform other tasks.  To check for an update later, open this request again from your dashboard. If [employer] has not replied within 24 hours for urgent requests or within 48 hours for standard requests, please contact [employer] at [phone].

## 2020-05-03 ENCOUNTER — Telehealth: Payer: Self-pay

## 2020-05-03 NOTE — Telephone Encounter (Signed)
Medication approved.  Approval good from 05/01/20 - 10/28/20.  Approval number: [id number]

## 2020-05-20 ENCOUNTER — Encounter: Payer: Self-pay | Admitting: Internal Medicine

## 2020-05-20 MED ORDER — SEMAGLUTIDE-WEIGHT MANAGEMENT 0.5 MG/0.5ML ~~LOC~~ SOAJ: 0.5000 mg | SUBCUTANEOUS | 0 refills | Status: DC

## 2020-06-05 MED ORDER — SEMAGLUTIDE-WEIGHT MANAGEMENT 1 MG/0.5ML ~~LOC~~ SOAJ: 1.0000 mg | SUBCUTANEOUS | 0 refills | Status: DC

## 2020-06-05 NOTE — Addendum Note (Signed)
Addended by: Binnie Rail on: 06/05/2020 05:38 AM   Modules accepted: Orders

## 2020-06-06 ENCOUNTER — Encounter: Payer: Self-pay | Admitting: Internal Medicine

## 2020-06-07 ENCOUNTER — Telehealth: Payer: Self-pay | Admitting: Oncology

## 2020-06-07 ENCOUNTER — Encounter: Payer: Self-pay | Admitting: Oncology

## 2020-06-07 ENCOUNTER — Other Ambulatory Visit: Payer: Self-pay | Admitting: Oncology

## 2020-06-07 DIAGNOSIS — U071 COVID-19: Secondary | ICD-10-CM

## 2020-06-07 NOTE — Progress Notes (Signed)
I connected by phone with  Cheryl Hampton to discuss the potential use of an new treatment for mild to moderate COVID-19 viral infection in non-hospitalized patients.   This patient is a age/sex that meets the FDA criteria for Emergency Use Authorization of casirivimab\imdevimab.  Has a (+) direct SARS-CoV-2 viral test result 1. Has mild or moderate COVID-19  2. Is ? 33 years of age and weighs ? 40 kg 3. Is NOT hospitalized due to COVID-19 4. Is NOT requiring oxygen therapy or requiring an increase in baseline oxygen flow rate due to COVID-19 5. Is within 10 days of symptom onset 6. Has at least one of the high risk factor(s) for progression to severe COVID-19 and/or hospitalization as defined in EUA. Specific high risk criteria :  Obesity- BMI > 25 Past Medical History:  Diagnosis Date  . Depression    Post partum took lexapro and wellbutrin and it helped  . Hypothyroid 2012 dx   post partum hyperthyroid  . Migraine headache   ?  ?    Symptom onset  06/04/20   I have spoken and communicated the following to the patient or parent/caregiver:   1. FDA has authorized the emergency use of casirivimab\imdevimab for the treatment of mild to moderate COVID-19 in adults and pediatric patients with positive results of direct SARS-CoV-2 viral testing who are 81 years of age and older weighing at least 40 kg, and who are at high risk for progressing to severe COVID-19 and/or hospitalization.   2. The significant known and potential risks and benefits of casirivimab\imdevimab, and the extent to which such potential risks and benefits are unknown.   3. Information on available alternative treatments and the risks and benefits of those alternatives, including clinical trials.   4. Patients treated with casirivimab\imdevimab should continue to self-isolate and use infection control measures (e.g., wear mask, isolate, social distance, avoid sharing personal items, clean and disinfect "high touch"  surfaces, and frequent handwashing) according to CDC guidelines.    5. The patient or parent/caregiver has the option to accept or refuse casirivimab\imdevimab .   After reviewing this information with the patient, The patient agreed to proceed with receiving casirivimab\imdevimab infusion and will be provided a copy of the Fact sheet prior to receiving the infusion.Rulon Abide, AGNP-C [phone] ([place])

## 2020-06-08 ENCOUNTER — Ambulatory Visit (HOSPITAL_COMMUNITY)
Admission: RE | Admit: 2020-06-08 | Discharge: 2020-06-08 | Disposition: A | Discharge status: Home / Self Care | Payer: 59 | Source: Ambulatory Visit | Attending: Pulmonary Disease | Admitting: Pulmonary Disease

## 2020-06-08 DIAGNOSIS — U071 COVID-19: Secondary | ICD-10-CM | POA: Insufficient documentation

## 2020-06-08 MED ORDER — SODIUM CHLORIDE 0.9 % IV SOLN: INTRAVENOUS | Status: DC | PRN

## 2020-06-08 MED ORDER — ALBUTEROL SULFATE HFA 108 (90 BASE) MCG/ACT IN AERS: 2.0000 | INHALATION_SPRAY | Freq: Once | RESPIRATORY_TRACT | Status: DC | PRN

## 2020-06-08 MED ORDER — METHYLPREDNISOLONE SODIUM SUCC 125 MG IJ SOLR: 125.0000 mg | Freq: Once | INTRAMUSCULAR | Status: DC | PRN

## 2020-06-08 MED ORDER — SODIUM CHLORIDE 0.9 % IV SOLN: Freq: Once | INTRAVENOUS | Status: AC

## 2020-06-08 MED ORDER — EPINEPHRINE 0.3 MG/0.3ML IJ SOAJ: 0.3000 mg | Freq: Once | INTRAMUSCULAR | Status: DC | PRN

## 2020-06-08 MED ORDER — FAMOTIDINE IN NACL 20-0.9 MG/50ML-% IV SOLN: 20.0000 mg | Freq: Once | INTRAVENOUS | Status: DC | PRN

## 2020-06-08 MED ORDER — DIPHENHYDRAMINE HCL 50 MG/ML IJ SOLN: 50.0000 mg | Freq: Once | INTRAMUSCULAR | Status: DC | PRN

## 2020-06-08 NOTE — Discharge Instructions (Signed)
10 Things You Can Do to Manage Your COVID-19 Symptoms at Home If you have possible or confirmed COVID-19: 1. Stay home from work and school. And stay away from other public places. If you must go out, avoid using any kind of public transportation, ridesharing, or taxis. 2. Monitor your symptoms carefully. If your symptoms get worse, call your healthcare provider immediately. 3. Get rest and stay hydrated. 4. If you have a medical appointment, call the healthcare provider ahead of time and tell them that you have or may have COVID-19. 5. For medical emergencies, call 911 and notify the dispatch personnel that you have or may have COVID-19. 6. Cover your cough and sneezes with a tissue or use the inside of your elbow. 7. Wash your hands often with soap and water for at least 20 seconds or clean your hands with an alcohol-based hand sanitizer that contains at least 60% alcohol. 8. As much as possible, stay in a specific room and away from other people in your home. Also, you should use a separate bathroom, if available. If you need to be around other people in or outside of the home, wear a mask. 9. Avoid sharing personal items with other people in your household, like dishes, towels, and bedding. 10. Clean all surfaces that are touched often, like counters, tabletops, and doorknobs. Use household cleaning sprays or wipes according to the label instructions. cdc.gov/coronavirus 12/21/2018 This information is not intended to replace advice given to you by your health care provider. Make sure you discuss any questions you have with your health care provider. Document Revised: 05/25/2019 Document Reviewed: 05/25/2019 Elsevier Patient Education  2020 Elsevier Inc. What types of side effects do monoclonal antibody drugs cause?  Common side effects  In general, the more common side effects caused by monoclonal antibody drugs include: . Allergic reactions, such as hives or itching . Flu-like signs and  symptoms, including chills, fatigue, fever, and muscle aches and pains . Nausea, vomiting . Diarrhea . Skin rashes . Low blood pressure   The CDC is recommending patients who receive monoclonal antibody treatments wait at least 90 days before being vaccinated.  Currently, there are no data on the safety and efficacy of mRNA COVID-19 vaccines in persons who received monoclonal antibodies or convalescent plasma as part of COVID-19 treatment. Based on the estimated half-life of such therapies as well as evidence suggesting that reinfection is uncommon in the 90 days after initial infection, vaccination should be deferred for at least 90 days, as a precautionary measure until additional information becomes available, to avoid interference of the antibody treatment with vaccine-induced immune responses. If you have any questions or concerns after the infusion please call the Advanced Practice Provider on call at [phone]. This number is ONLY intended for your use regarding questions or concerns about the infusion post-treatment side-effects.  Please do not provide this number to others for use. For return to work notes please contact your primary care provider.   If someone you know is interested in receiving treatment please have them call the COVID hotline at [phone].   

## 2020-06-08 NOTE — Progress Notes (Signed)
Patient reviewed Fact Sheet for Patients, Parents, and Caregivers for Emergency Use Authorization (EUA) of bamlanivimab and etesevimab for the Treatment of Coronavirus. Patient also reviewed and is agreeable to the estimated cost of treatment. Patient is agreeable to proceed.   

## 2020-06-08 NOTE — Progress Notes (Signed)
  Diagnosis: COVID-19  Physician: Asencion Noble, MD  Procedure: [facility] Med: bamlanivimab\etesevimab infusion - Provided patient with bamlanimivab\etesevimab fact sheet for patients, parents and caregivers prior to infusion.  Complications: No immediate complications noted.  Discharge: Discharged home   Cheryl Hampton 06/08/2020

## 2020-06-18 ENCOUNTER — Encounter: Payer: Self-pay | Admitting: Internal Medicine

## 2020-06-18 ENCOUNTER — Other Ambulatory Visit: Payer: Self-pay | Admitting: Internal Medicine

## 2020-06-18 MED ORDER — SEMAGLUTIDE-WEIGHT MANAGEMENT 1.7 MG/0.75ML ~~LOC~~ SOAJ: 1.7000 mg | SUBCUTANEOUS | 0 refills | Status: DC

## 2020-07-29 ENCOUNTER — Encounter: Payer: Self-pay | Admitting: Internal Medicine

## 2020-07-29 MED ORDER — LOSARTAN POTASSIUM-HCTZ 50-12.5 MG PO TABS: 1.0000 | ORAL_TABLET | Freq: Every day | ORAL | 0 refills | Status: DC

## 2020-08-08 ENCOUNTER — Encounter: Payer: Self-pay | Admitting: Internal Medicine

## 2020-08-10 ENCOUNTER — Other Ambulatory Visit: Payer: Self-pay | Admitting: Internal Medicine

## 2020-08-10 MED ORDER — SEMAGLUTIDE-WEIGHT MANAGEMENT 2.4 MG/0.75ML ~~LOC~~ SOAJ: 2.4000 mg | SUBCUTANEOUS | 5 refills | Status: DC

## 2020-08-27 DIAGNOSIS — D225 Melanocytic nevi of trunk: Secondary | ICD-10-CM | POA: Diagnosis not present

## 2020-08-27 DIAGNOSIS — D485 Neoplasm of uncertain behavior of skin: Secondary | ICD-10-CM | POA: Diagnosis not present

## 2020-08-27 DIAGNOSIS — D499 Neoplasm of unspecified behavior of unspecified site: Secondary | ICD-10-CM | POA: Diagnosis not present

## 2020-08-27 DIAGNOSIS — Z1283 Encounter for screening for malignant neoplasm of skin: Secondary | ICD-10-CM | POA: Diagnosis not present

## 2020-09-11 DIAGNOSIS — D225 Melanocytic nevi of trunk: Secondary | ICD-10-CM | POA: Diagnosis not present

## 2020-09-11 DIAGNOSIS — L988 Other specified disorders of the skin and subcutaneous tissue: Secondary | ICD-10-CM | POA: Diagnosis not present

## 2020-09-11 DIAGNOSIS — D485 Neoplasm of uncertain behavior of skin: Secondary | ICD-10-CM | POA: Diagnosis not present

## 2020-09-12 ENCOUNTER — Other Ambulatory Visit (HOSPITAL_BASED_OUTPATIENT_CLINIC_OR_DEPARTMENT_OTHER): Payer: Self-pay

## 2020-09-24 ENCOUNTER — Other Ambulatory Visit (HOSPITAL_COMMUNITY): Payer: Self-pay

## 2020-09-24 MED FILL — Levothyroxine Sodium Tab 100 MCG: ORAL | 90 days supply | Qty: 90 | Fill #0 | Status: AC

## 2020-09-26 ENCOUNTER — Other Ambulatory Visit (HOSPITAL_COMMUNITY): Payer: Self-pay

## 2020-10-09 MED FILL — Semaglutide (Weight Mngmt) Soln Auto-Injector 2.4 MG/0.75ML: SUBCUTANEOUS | 28 days supply | Qty: 3 | Fill #0 | Status: AC

## 2020-10-10 ENCOUNTER — Other Ambulatory Visit (HOSPITAL_COMMUNITY): Payer: Self-pay

## 2020-10-12 ENCOUNTER — Other Ambulatory Visit (HOSPITAL_COMMUNITY): Payer: Self-pay

## 2020-10-22 ENCOUNTER — Other Ambulatory Visit: Payer: Self-pay | Admitting: Internal Medicine

## 2020-10-23 ENCOUNTER — Other Ambulatory Visit (HOSPITAL_COMMUNITY): Payer: Self-pay

## 2020-10-23 MED ORDER — VENLAFAXINE HCL ER 75 MG PO CP24
ORAL_CAPSULE | ORAL | 1 refills | Status: DC
  Filled 2020-10-23: qty 90, 90d supply, fill #0
  Filled 2021-04-17: qty 90, 90d supply, fill #1

## 2020-10-23 MED FILL — Losartan Potassium & Hydrochlorothiazide Tab 50-12.5 MG: ORAL | 90 days supply | Qty: 90 | Fill #0 | Status: AC

## 2020-11-02 DIAGNOSIS — H5213 Myopia, bilateral: Secondary | ICD-10-CM | POA: Diagnosis not present

## 2020-11-02 DIAGNOSIS — H52223 Regular astigmatism, bilateral: Secondary | ICD-10-CM | POA: Diagnosis not present

## 2020-11-11 ENCOUNTER — Other Ambulatory Visit (HOSPITAL_COMMUNITY): Payer: Self-pay

## 2020-11-11 MED FILL — Semaglutide (Weight Mngmt) Soln Auto-Injector 2.4 MG/0.75ML: SUBCUTANEOUS | 28 days supply | Qty: 3 | Fill #1 | Status: AC

## 2020-11-12 ENCOUNTER — Other Ambulatory Visit (HOSPITAL_COMMUNITY): Payer: Self-pay

## 2020-11-14 ENCOUNTER — Encounter: Payer: Self-pay | Admitting: Internal Medicine

## 2020-11-15 ENCOUNTER — Other Ambulatory Visit (HOSPITAL_COMMUNITY): Payer: Self-pay

## 2020-11-19 ENCOUNTER — Other Ambulatory Visit (HOSPITAL_COMMUNITY): Payer: Self-pay

## 2020-11-19 ENCOUNTER — Telehealth: Payer: Self-pay

## 2020-11-19 NOTE — Telephone Encounter (Signed)
Key: [id number]

## 2020-11-22 ENCOUNTER — Encounter: Payer: 59 | Admitting: Internal Medicine

## 2020-11-25 NOTE — Patient Instructions (Addendum)
    Blood work was ordered.     Medications changes include :  none   Your prescription(s) have been submitted to your pharmacy. Please take as directed and contact our office if you believe you are having problem(s) with the medication(s).   Please followup in 6 months  

## 2020-11-25 NOTE — Progress Notes (Signed)
Subjective:    Patient ID: Cheryl Hampton, female    DOB: 05-15-1986, 35 y.o.   MRN: [id number]  HPI The patient is here for follow up of their chronic medical problems, including migraines, htn, hypothyroidism, depression, anxiety   More headaches - saw eye doctor.  ? Related to eye strain.  No necessarily migraines.  No migraines in a while. She takes fioricet as needed.     Medications and allergies reviewed with patient and updated if appropriate.  Patient Active Problem List   Diagnosis Date Noted  . Body mass index (BMI) 40.0-44.9, adult ([facility]) 04/22/2020  . Hyperglycemia 01/18/2020  . Hypomagnesemia   . Hypophosphatemia   . Wrist pain, acute, right 06/28/2019  . Essential hypertension 04/24/2019  . Anxiety 01/13/2019  . Menorrhagia 03/21/2018  . Sciatica of right side 02/05/2017  . 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 Nonallopathic lesion of sacral region 07/24/2016  . Nonallopathic lesion of thoracic region 07/24/2016  . Nonallopathic lesion of lumbosacral region 07/24/2016  . Chronic midline low back pain without sciatica 06/12/2016  . Hair loss 06/12/2016  . Hypothyroidism 06/12/2016  . Morbid obesity ([place]) 01/09/2013  . Depression   . Migraine headache 02/06/2011    Current Outpatient Medications on File Prior to Visit  Medication Sig Dispense Refill  . butalbital-acetaminophen-caffeine (FIORICET, ESGIC) 50-325-40 MG tablet Take 1 tablet by mouth every 4 (four) hours as needed for headache. 90 tablet 0  . levonorgestrel (MIRENA, 52 MG,) 20 MCG/24HR IUD Mirena 20 mcg/24 hours (7 yrs) 52 mg intrauterine device  Take 1 device every day by intrauterine route.    Marland Kitchen levonorgestrel (MIRENA, 52 MG,) 20 MCG/DAY IUD Mirena 20 mcg/24 hours (7 yrs) 52 mg intrauterine device  Take 1 device every day by intrauterine route.    Marland Kitchen levothyroxine (SYNTHROID) 100 MCG tablet TAKE 1 TABLET BY MOUTH ONCE A DAY 90 tablet 2  . losartan-hydrochlorothiazide (HYZAAR)  50-12.5 MG tablet Take 1 tablet by mouth daily. 5 tablet 0  . Semaglutide-Weight Management 2.4 MG/0.75ML SOAJ INJECT 2.4 MG INTO THE SKIN ONCE A WEEK. 3 mL 5  . venlafaxine XR (EFFEXOR-XR) 150 MG 24 hr capsule TAKE 1 CAPSULE BY MOUTH DAILY WITH BREAKFAST. IN ADDITION TO 75 MG FOR TOTAL OF 225 MG 90 capsule 2  . venlafaxine XR (EFFEXOR-XR) 75 MG 24 hr capsule TAKE 1 CAPSULE BY MOUTH ONCE DAILY WITH BREAKFAST (TOTAL OF 225 MG DAILY) 90 capsule 1   No current facility-administered medications on file prior to visit.    Past Medical History:  Diagnosis Date  . Depression    Post partum took lexapro and wellbutrin and it helped  . Hypothyroid 2012 dx   post partum hyperthyroid  . Migraine headache     Past Surgical History:  Procedure Laterality Date  . CHOLECYSTECTOMY N/A 07/18/2019   Procedure: LAPAROSCOPIC CHOLECYSTECTOMY;  Surgeon: Aviva Signs, MD;  Location: [facility];  Service: General;  Laterality: N/A;  . MOUTH SURGERY    . Removal (R) Fallopian tube  09/2009    Social History   Socioeconomic History  . Marital status: Married    Spouse name: Not on file  . Number of children: Not on file  . Years of education: Not on file  . Highest education level: Not on file  Occupational History  . Not on file  Tobacco Use  . Smoking status: Never Smoker  . Smokeless tobacco: Never Used  Substance and Sexual Activity  . Alcohol use: No  .  Drug use: No  . Sexual activity: Yes  Other Topics Concern  . Not on file  Social History Narrative  . Not on file   Social Determinants of Health   Financial Resource Strain: Not on file  Food Insecurity: Not on file  Transportation Needs: Not on file  Physical Activity: Not on file  Stress: Not on file  Social Connections: Not on file    Family History  Problem Relation Age of Onset  . Alcohol abuse Maternal Uncle   . Pancreatic cancer Maternal Grandmother   . Stroke Maternal Grandfather   . Hypertension Mother   . Diabetes  Mother   . Hyperlipidemia Mother   . Thyroid disease Neg Hx     Review of Systems  Constitutional: Positive for fatigue. Negative for fever.  Respiratory: Negative for cough, shortness of breath and wheezing.   Cardiovascular: Negative for chest pain, palpitations and leg swelling.  Neurological: Positive for headaches. Negative for light-headedness.       Objective:   Vitals:   11/26/20 1528  BP: 116/78  Pulse: 97  Temp: 98.1 F (36.7 C)  SpO2: 99%   BP Readings from Last 3 Encounters:  11/26/20 116/78  06/08/20 (!) 146/96  04/22/20 116/72   Wt Readings from Last 3 Encounters:  11/26/20 229 lb (103.9 kg)  04/22/20 237 lb (107.5 kg)  02/15/20 229 lb (103.9 kg)   Body mass index is 39.31 kg/m.   Physical Exam    Constitutional: Appears well-developed and well-nourished. No distress.  HENT:  Head: Normocephalic and atraumatic.  Neck: Neck supple. No tracheal deviation present. No thyromegaly present.  No cervical lymphadenopathy Cardiovascular: Normal rate, regular rhythm and normal heart sounds.   No murmur heard. No carotid bruit .  No edema Pulmonary/Chest: Effort normal and breath sounds normal. No respiratory distress. No has no wheezes. No rales.  Skin: Skin is warm and dry. Not diaphoretic.  Psychiatric: Normal mood and affect. Behavior is normal.      Assessment & Plan:    See Problem List for Assessment and Plan of chronic medical problems.    This visit occurred during the SARS-CoV-2 public health emergency.  Safety protocols were in place, including screening questions prior to the visit, additional usage of staff PPE, and extensive cleaning of exam room while observing appropriate contact time as indicated for disinfecting solutions.

## 2020-11-26 ENCOUNTER — Encounter: Payer: Self-pay | Admitting: Internal Medicine

## 2020-11-26 ENCOUNTER — Other Ambulatory Visit (HOSPITAL_COMMUNITY): Payer: Self-pay

## 2020-11-26 ENCOUNTER — Other Ambulatory Visit: Payer: Self-pay

## 2020-11-26 ENCOUNTER — Ambulatory Visit: Payer: 59 | Admitting: Internal Medicine

## 2020-11-26 VITALS — BP 116/78 | HR 97 | Temp 98.1°F | Ht 64.0 in | Wt 229.0 lb

## 2020-11-26 DIAGNOSIS — F419 Anxiety disorder, unspecified: Secondary | ICD-10-CM | POA: Diagnosis not present

## 2020-11-26 DIAGNOSIS — R739 Hyperglycemia, unspecified: Secondary | ICD-10-CM | POA: Diagnosis not present

## 2020-11-26 DIAGNOSIS — L659 Nonscarring hair loss, unspecified: Secondary | ICD-10-CM | POA: Diagnosis not present

## 2020-11-26 DIAGNOSIS — E039 Hypothyroidism, unspecified: Secondary | ICD-10-CM | POA: Diagnosis not present

## 2020-11-26 DIAGNOSIS — G43809 Other migraine, not intractable, without status migrainosus: Secondary | ICD-10-CM

## 2020-11-26 DIAGNOSIS — I10 Essential (primary) hypertension: Secondary | ICD-10-CM | POA: Diagnosis not present

## 2020-11-26 DIAGNOSIS — D2372 Other benign neoplasm of skin of left lower limb, including hip: Secondary | ICD-10-CM

## 2020-11-26 DIAGNOSIS — F3289 Other specified depressive episodes: Secondary | ICD-10-CM | POA: Diagnosis not present

## 2020-11-26 MED ORDER — BUTALBITAL-APAP-CAFFEINE 50-325-40 MG PO TABS
1.0000 | ORAL_TABLET | ORAL | 0 refills | Status: DC | PRN
  Filled 2020-11-26: qty 45, 8d supply, fill #0

## 2020-11-26 NOTE — Assessment & Plan Note (Signed)
Chronic Controlled, stable Continue venlafaxine 225 mg daily

## 2020-11-26 NOTE — Assessment & Plan Note (Signed)
Chronic Check a1c Low sugar / carb diet Stressed regular exercise  

## 2020-11-26 NOTE — Assessment & Plan Note (Signed)
Chronic Migraines vs headaches fioricet is effective - will continue - renewed today

## 2020-11-26 NOTE — Assessment & Plan Note (Signed)
Chronic BMI 39.31 with htn, prediabetes On wegovy and doing well with it Continue to work on diet and exercise Continue wegovy 2.4 mg [place] q week

## 2020-11-26 NOTE — Assessment & Plan Note (Signed)
Improved with primrose oil

## 2020-11-26 NOTE — Assessment & Plan Note (Signed)
Chronic  Clinically euthyroid Currently taking levothyroxine 100 mcg daily Check tsh  Titrate med dose if needed  

## 2020-11-26 NOTE — Assessment & Plan Note (Signed)
Chronic BP well controlled Continue hyzaar 50-12.5 mg daily cmp

## 2020-11-27 LAB — COMPREHENSIVE METABOLIC PANEL
ALT: 20 U/L (ref 0–35)
AST: 16 U/L (ref 0–37)
Albumin: 4.1 g/dL (ref 3.5–5.2)
Alkaline Phosphatase: 49 U/L (ref 39–117)
BUN: 12 mg/dL (ref 6–23)
CO2: 31 mEq/L (ref 19–32)
Calcium: 9.4 mg/dL (ref 8.4–10.5)
Chloride: 98 mEq/L (ref 96–112)
Creatinine, Ser: 0.67 mg/dL (ref 0.40–1.20)
GFR: 114.68 mL/min (ref >60.00)
Glucose, Bld: 81 mg/dL (ref 70–99)
Potassium: 3.9 mEq/L (ref 3.5–5.1)
Sodium: 136 mEq/L (ref 135–145)
Total Bilirubin: 0.4 mg/dL (ref 0.2–1.2)
Total Protein: 7.5 g/dL (ref 6.0–8.3)

## 2020-11-27 LAB — TSH: TSH: 1.45 u[IU]/mL (ref 0.35–4.50)

## 2020-11-27 LAB — HEMOGLOBIN A1C: Hgb A1c MFr Bld: 5.4 % (ref 4.6–6.5)

## 2020-11-27 LAB — T4, FREE: Free T4: 0.85 ng/dL (ref 0.60–1.60)

## 2020-12-16 ENCOUNTER — Other Ambulatory Visit (HOSPITAL_COMMUNITY): Payer: Self-pay

## 2020-12-16 MED FILL — Semaglutide (Weight Mngmt) Soln Auto-Injector 2.4 MG/0.75ML: SUBCUTANEOUS | 28 days supply | Qty: 3 | Fill #2 | Status: AC

## 2021-01-09 ENCOUNTER — Other Ambulatory Visit (HOSPITAL_COMMUNITY): Payer: Self-pay

## 2021-01-09 ENCOUNTER — Other Ambulatory Visit: Payer: Self-pay | Admitting: Internal Medicine

## 2021-01-09 MED ORDER — LEVOTHYROXINE SODIUM 100 MCG PO TABS
ORAL_TABLET | Freq: Every day | ORAL | 2 refills | Status: DC
  Filled 2021-01-09: qty 90, 90d supply, fill #0
  Filled 2021-05-01: qty 90, 90d supply, fill #1
  Filled 2021-07-22: qty 90, 90d supply, fill #2

## 2021-01-09 MED ORDER — VENLAFAXINE HCL ER 150 MG PO CP24
ORAL_CAPSULE | Freq: Every day | ORAL | 2 refills | Status: DC
Start: 2021-01-09 — End: 2021-10-30
  Filled 2021-01-09: qty 90, 90d supply, fill #0
  Filled 2021-04-17: qty 90, 90d supply, fill #1
  Filled 2021-07-22: qty 90, 90d supply, fill #2

## 2021-01-09 MED FILL — Semaglutide (Weight Mngmt) Soln Auto-Injector 2.4 MG/0.75ML: SUBCUTANEOUS | 28 days supply | Qty: 3 | Fill #3 | Status: AC

## 2021-01-20 ENCOUNTER — Other Ambulatory Visit (HOSPITAL_COMMUNITY): Payer: Self-pay

## 2021-01-20 ENCOUNTER — Other Ambulatory Visit: Payer: Self-pay | Admitting: Internal Medicine

## 2021-01-20 MED ORDER — LOSARTAN POTASSIUM-HCTZ 50-12.5 MG PO TABS
1.0000 | ORAL_TABLET | Freq: Every day | ORAL | 3 refills | Status: DC
  Filled 2021-01-20: qty 90, 90d supply, fill #0
  Filled 2021-05-01: qty 90, 90d supply, fill #1
  Filled 2021-07-22: qty 90, 90d supply, fill #2
  Filled 2021-10-30: qty 90, 90d supply, fill #3

## 2021-02-07 NOTE — Telephone Encounter (Signed)
error 

## 2021-02-17 DIAGNOSIS — Z1389 Encounter for screening for other disorder: Secondary | ICD-10-CM | POA: Diagnosis not present

## 2021-02-17 DIAGNOSIS — Z3009 Encounter for other general counseling and advice on contraception: Secondary | ICD-10-CM | POA: Diagnosis not present

## 2021-02-17 DIAGNOSIS — S233XXA Sprain of ligaments of thoracic spine, initial encounter: Secondary | ICD-10-CM | POA: Diagnosis not present

## 2021-02-17 DIAGNOSIS — Z302 Encounter for sterilization: Secondary | ICD-10-CM | POA: Diagnosis not present

## 2021-02-17 DIAGNOSIS — N393 Stress incontinence (female) (male): Secondary | ICD-10-CM | POA: Diagnosis not present

## 2021-02-17 DIAGNOSIS — N920 Excessive and frequent menstruation with regular cycle: Secondary | ICD-10-CM | POA: Diagnosis not present

## 2021-02-17 DIAGNOSIS — Z6839 Body mass index (BMI) 39.0-39.9, adult: Secondary | ICD-10-CM | POA: Diagnosis not present

## 2021-02-17 DIAGNOSIS — Z01419 Encounter for gynecological examination (general) (routine) without abnormal findings: Secondary | ICD-10-CM | POA: Diagnosis not present

## 2021-02-17 DIAGNOSIS — S338XXA Sprain of other parts of lumbar spine and pelvis, initial encounter: Secondary | ICD-10-CM | POA: Diagnosis not present

## 2021-02-17 DIAGNOSIS — G43001 Migraine without aura, not intractable, with status migrainosus: Secondary | ICD-10-CM | POA: Diagnosis not present

## 2021-02-17 DIAGNOSIS — Z124 Encounter for screening for malignant neoplasm of cervix: Secondary | ICD-10-CM | POA: Diagnosis not present

## 2021-02-20 ENCOUNTER — Other Ambulatory Visit: Payer: Self-pay | Admitting: Internal Medicine

## 2021-02-20 ENCOUNTER — Other Ambulatory Visit (HOSPITAL_COMMUNITY): Payer: Self-pay

## 2021-02-20 MED ORDER — WEGOVY 2.4 MG/0.75ML ~~LOC~~ SOAJ
SUBCUTANEOUS | 5 refills | Status: DC
  Filled 2021-02-20: qty 3, 28d supply, fill #0
  Filled 2021-04-17: qty 3, 28d supply, fill #1
  Filled 2021-05-21: qty 3, 28d supply, fill #2
  Filled 2021-06-12: qty 3, 28d supply, fill #3
  Filled 2021-07-16: qty 3, 28d supply, fill #4
  Filled 2021-09-04: qty 3, 28d supply, fill #5

## 2021-02-21 ENCOUNTER — Other Ambulatory Visit (HOSPITAL_COMMUNITY): Payer: Self-pay

## 2021-02-21 DIAGNOSIS — G43001 Migraine without aura, not intractable, with status migrainosus: Secondary | ICD-10-CM | POA: Diagnosis not present

## 2021-02-21 DIAGNOSIS — S233XXA Sprain of ligaments of thoracic spine, initial encounter: Secondary | ICD-10-CM | POA: Diagnosis not present

## 2021-02-21 DIAGNOSIS — S338XXA Sprain of other parts of lumbar spine and pelvis, initial encounter: Secondary | ICD-10-CM | POA: Diagnosis not present

## 2021-03-17 DIAGNOSIS — S338XXA Sprain of other parts of lumbar spine and pelvis, initial encounter: Secondary | ICD-10-CM | POA: Diagnosis not present

## 2021-03-17 DIAGNOSIS — S233XXA Sprain of ligaments of thoracic spine, initial encounter: Secondary | ICD-10-CM | POA: Diagnosis not present

## 2021-03-17 DIAGNOSIS — G43001 Migraine without aura, not intractable, with status migrainosus: Secondary | ICD-10-CM | POA: Diagnosis not present

## 2021-03-28 DIAGNOSIS — M79675 Pain in left toe(s): Secondary | ICD-10-CM | POA: Diagnosis not present

## 2021-03-28 DIAGNOSIS — L600 Ingrowing nail: Secondary | ICD-10-CM | POA: Diagnosis not present

## 2021-03-28 DIAGNOSIS — L03032 Cellulitis of left toe: Secondary | ICD-10-CM | POA: Diagnosis not present

## 2021-04-03 DIAGNOSIS — M79675 Pain in left toe(s): Secondary | ICD-10-CM | POA: Diagnosis not present

## 2021-04-03 DIAGNOSIS — L03032 Cellulitis of left toe: Secondary | ICD-10-CM | POA: Diagnosis not present

## 2021-04-17 ENCOUNTER — Other Ambulatory Visit (HOSPITAL_COMMUNITY): Payer: Self-pay

## 2021-04-18 ENCOUNTER — Other Ambulatory Visit (HOSPITAL_COMMUNITY): Payer: Self-pay

## 2021-04-21 ENCOUNTER — Other Ambulatory Visit (HOSPITAL_COMMUNITY): Payer: Self-pay

## 2021-05-01 ENCOUNTER — Other Ambulatory Visit (HOSPITAL_COMMUNITY): Payer: Self-pay

## 2021-05-21 ENCOUNTER — Other Ambulatory Visit (HOSPITAL_COMMUNITY): Payer: Self-pay

## 2021-06-05 ENCOUNTER — Encounter: Payer: Self-pay | Admitting: Internal Medicine

## 2021-06-08 NOTE — Progress Notes (Deleted)
Virtual Visit via Video Note  I connected with Ennis Delpozo on 06/08/21 at  2:00 PM EST by a video enabled telemedicine application and verified that I am speaking with the correct person using two identifiers.   I discussed the limitations of evaluation and management by telemedicine and the availability of in person appointments. The patient expressed understanding and agreed to proceed.  Present for the visit:  Myself, Dr Billey Gosling, Hamsini Doro.  The patient is currently at home and I am in the office.    No referring provider.    History of Present Illness: She is here for follow up of her chronic medical conditions - migraines, htn, hypothyroidism, depression, anxiety  She is taking all of her medications as prescribed.     ROS    Social History   Socioeconomic History   Marital status: Married    Spouse name: Not on file   Number of children: Not on file   Years of education: Not on file   Highest education level: Not on file  Occupational History   Not on file  Tobacco Use   Smoking status: Never   Smokeless tobacco: Never  Substance and Sexual Activity   Alcohol use: No   Drug use: No   Sexual activity: Yes  Other Topics Concern   Not on file  Social History Narrative   Not on file   Social Determinants of Health   Financial Resource Strain: Not on file  Food Insecurity: Not on file  Transportation Needs: Not on file  Physical Activity: Not on file  Stress: Not on file  Social Connections: Not on file     Observations/Objective: Appears well in NAD   Assessment and Plan:  See Problem List for Assessment and Plan of chronic medical problems.   Follow Up Instructions:    I discussed the assessment and treatment plan with the patient. The patient was provided an opportunity to ask questions and all were answered. The patient agreed with the plan and demonstrated an understanding of the instructions.   The patient was advised to call  back or seek an in-person evaluation if the symptoms worsen or if the condition fails to improve as anticipated.    Binnie Rail, MD

## 2021-06-09 ENCOUNTER — Telehealth: Payer: 59 | Admitting: Internal Medicine

## 2021-06-09 ENCOUNTER — Encounter: Payer: Self-pay | Admitting: Internal Medicine

## 2021-06-12 ENCOUNTER — Other Ambulatory Visit (HOSPITAL_COMMUNITY): Payer: Self-pay

## 2021-06-26 NOTE — Progress Notes (Signed)
Virtual Visit via Video Note  I connected with Cheryl Hampton on 06/26/21 at  2:20 PM EST by a video enabled telemedicine application and verified that I am speaking with the correct person using two identifiers.   I discussed the limitations of evaluation and management by telemedicine and the availability of in person appointments. The patient expressed understanding and agreed to proceed.  Present for the visit:  Myself, Dr Billey Gosling, Adelene Helm.  The patient is currently at home and I am in the office.    No referring provider.    History of Present Illness: She is here for follow up of her chronic medical conditions - migraines, htn, hypothyroidism, depression, anxiety, morbid obesity.  She is taking all of her medications as prescribed.  She is taking wegovy.   Last month she had increased anxiety - it is better now, but still has anxiety - feels her medication is working well.    BP is well controlled   Review of Systems  Respiratory:  Negative for cough, shortness of breath and wheezing.   Cardiovascular:  Negative for chest pain, palpitations and leg swelling.  Neurological:  Negative for dizziness and headaches.     Social History   Socioeconomic History   Marital status: Married    Spouse name: Not on file   Number of children: Not on file   Years of education: Not on file   Highest education level: Not on file  Occupational History   Not on file  Tobacco Use   Smoking status: Never   Smokeless tobacco: Never  Substance and Sexual Activity   Alcohol use: No   Drug use: No   Sexual activity: Yes  Other Topics Concern   Not on file  Social History Narrative   Not on file   Social Determinants of Health   Financial Resource Strain: Not on file  Food Insecurity: Not on file  Transportation Needs: Not on file  Physical Activity: Not on file  Stress: Not on file  Social Connections: Not on file     Observations/Objective: Appears well in  NAD Breathing normally Mood normal  Assessment and Plan:  See Problem List for Assessment and Plan of chronic medical problems.   Follow Up Instructions:    I discussed the assessment and treatment plan with the patient. The patient was provided an opportunity to ask questions and all were answered. The patient agreed with the plan and demonstrated an understanding of the instructions.   The patient was advised to call back or seek an in-person evaluation if the symptoms worsen or if the condition fails to improve as anticipated.    Binnie Rail, MD

## 2021-06-27 ENCOUNTER — Telehealth (INDEPENDENT_AMBULATORY_CARE_PROVIDER_SITE_OTHER): Payer: 59 | Admitting: Internal Medicine

## 2021-06-27 ENCOUNTER — Encounter: Payer: Self-pay | Admitting: Internal Medicine

## 2021-06-27 ENCOUNTER — Other Ambulatory Visit (HOSPITAL_COMMUNITY): Payer: Self-pay

## 2021-06-27 DIAGNOSIS — F3289 Other specified depressive episodes: Secondary | ICD-10-CM | POA: Diagnosis not present

## 2021-06-27 DIAGNOSIS — E039 Hypothyroidism, unspecified: Secondary | ICD-10-CM

## 2021-06-27 DIAGNOSIS — G43809 Other migraine, not intractable, without status migrainosus: Secondary | ICD-10-CM

## 2021-06-27 DIAGNOSIS — F419 Anxiety disorder, unspecified: Secondary | ICD-10-CM | POA: Diagnosis not present

## 2021-06-27 DIAGNOSIS — I10 Essential (primary) hypertension: Secondary | ICD-10-CM | POA: Diagnosis not present

## 2021-06-27 MED ORDER — BUSPIRONE HCL 5 MG PO TABS
5.0000 mg | ORAL_TABLET | Freq: Three times a day (TID) | ORAL | 5 refills | Status: DC | PRN
  Filled 2021-06-27: qty 90, 30d supply, fill #0

## 2021-06-27 NOTE — Assessment & Plan Note (Signed)
Chronic Intermittent Continue fioricet prn

## 2021-06-27 NOTE — Assessment & Plan Note (Signed)
Chronic BP well controlled Continue hyzaar 50-12.5 mg daily 

## 2021-06-27 NOTE — Assessment & Plan Note (Signed)
Chronic  Clinically euthyroid Continue taking levothyroxine 100 mcg daily 

## 2021-06-27 NOTE — Assessment & Plan Note (Signed)
Chronic Working on weight loss  Continue wegovy 2.4 mg weekly - tolerating medication well and she feels it is helping

## 2021-06-27 NOTE — Assessment & Plan Note (Signed)
Chronic Not ideally controlled Continue effexor 225 mg daily Start buspar 5 mg TID prn - discussed possible side effects

## 2021-06-27 NOTE — Assessment & Plan Note (Signed)
Chronic Controlled, stable Continue effexor 225 mg daily  

## 2021-07-07 ENCOUNTER — Other Ambulatory Visit (HOSPITAL_COMMUNITY): Payer: Self-pay

## 2021-07-08 ENCOUNTER — Other Ambulatory Visit (HOSPITAL_COMMUNITY): Payer: Self-pay

## 2021-07-10 DIAGNOSIS — L600 Ingrowing nail: Secondary | ICD-10-CM | POA: Diagnosis not present

## 2021-07-11 ENCOUNTER — Other Ambulatory Visit (HOSPITAL_COMMUNITY): Payer: Self-pay

## 2021-07-16 ENCOUNTER — Other Ambulatory Visit (HOSPITAL_COMMUNITY): Payer: Self-pay

## 2021-07-22 ENCOUNTER — Other Ambulatory Visit: Payer: Self-pay | Admitting: Internal Medicine

## 2021-07-22 ENCOUNTER — Other Ambulatory Visit (HOSPITAL_COMMUNITY): Payer: Self-pay

## 2021-07-22 MED ORDER — VENLAFAXINE HCL ER 75 MG PO CP24
ORAL_CAPSULE | ORAL | 1 refills | Status: DC
  Filled 2021-07-22: qty 90, 90d supply, fill #0
  Filled 2021-10-30: qty 90, 90d supply, fill #1

## 2021-07-28 ENCOUNTER — Other Ambulatory Visit (HOSPITAL_COMMUNITY): Payer: Self-pay

## 2021-08-21 ENCOUNTER — Ambulatory Visit (INDEPENDENT_AMBULATORY_CARE_PROVIDER_SITE_OTHER): Payer: 59 | Admitting: Gastroenterology

## 2021-08-21 ENCOUNTER — Other Ambulatory Visit: Payer: Self-pay

## 2021-08-21 VITALS — BP 110/60 | HR 94 | Temp 97.2°F | Ht 64.0 in | Wt 225.0 lb

## 2021-08-21 DIAGNOSIS — K642 Third degree hemorrhoids: Secondary | ICD-10-CM | POA: Diagnosis not present

## 2021-08-21 NOTE — Patient Instructions (Signed)
Please continue to avoid straining. ? ?You should limit your toilet time to 2-3 minutes at the most.  ? ?Continue to avoid constipation. ? ?Please call me with any concerns or issues! ? ?I will see you in follow-up for additional banding in several weeks. ? ? ?Annitta Needs, PhD, ANP-BC ?[facility]  ? ? ? ? ?  ?

## 2021-08-21 NOTE — Progress Notes (Signed)
? ? ?  [place] BANDING PROCEDURE NOTE ? ?Cheryl Hampton is a 36 y.o. female presenting today for consideration of hemorrhoid banding. No prior colonoscopy. She is not interested in colonoscopy at this time. Low-volume bleeding noted. Pressure. Prolapsing tissue that reduces manually.  ? ? ?The patient presents with symptomatic grade 3 hemorrhoids, unresponsive to maximal medical therapy, requesting rubber band ligation of her hemorrhoidal disease. All risks, benefits, and alternative forms of therapy were described and informed consent was obtained. ? ?In the left lateral decubitus position, anoscopic examination revealed grade 3 hemorrhoids in all columns but predominantly left lateral and right anterior.  ? ?The decision was made to band the left lateral internal hemorrhoid, and the [place] was used to perform band ligation without complication. Digital anorectal examination was then performed to assure proper positioning of the band, and to adjust the banded tissue as required. The patient was discharged home without pain or other issues. Dietary and behavioral recommendations were given, along with follow-up instructions. The patient will return in several weeks for followup and possible additional banding as required. ? ?No complications were encountered and the patient tolerated the procedure well.  ? ?Annitta Needs, PhD, ANP-BC ?[facility]  ? ?

## 2021-09-04 ENCOUNTER — Other Ambulatory Visit (HOSPITAL_COMMUNITY): Payer: Self-pay

## 2021-09-04 ENCOUNTER — Ambulatory Visit (INDEPENDENT_AMBULATORY_CARE_PROVIDER_SITE_OTHER): Payer: 59 | Admitting: Gastroenterology

## 2021-09-04 ENCOUNTER — Other Ambulatory Visit: Payer: Self-pay

## 2021-09-04 ENCOUNTER — Encounter: Payer: Self-pay | Admitting: Gastroenterology

## 2021-09-04 VITALS — BP 118/81 | HR 85 | Temp 98.0°F | Ht 64.0 in | Wt 225.0 lb

## 2021-09-04 DIAGNOSIS — K642 Third degree hemorrhoids: Secondary | ICD-10-CM

## 2021-09-04 NOTE — Progress Notes (Signed)
? ? ?  [place] BANDING PROCEDURE NOTE ? ?Cheryl Hampton is a 36 y.o. female presenting today for consideration of hemorrhoid banding. No prior colonoscopy and not interested in pursuing at this time. Low-volume bleeding noted, pressure, prolapsing tissue. She has had left lateral banding. She has noticed improvement in her symptoms.  ? ? ?The patient presents with symptomatic grade 3 hemorrhoids, unresponsive to maximal medical therapy, requesting rubber band ligation of her hemorrhoidal disease. All risks, benefits, and alternative forms of therapy were described and informed consent was obtained. ? ? ?The decision was made to band the right posterior and right anterior internal hemorrhoid, and the [place] was used to perform band ligation without complication. Digital anorectal examination was then performed to assure proper positioning of the band, and to adjust the banded tissue as required. The patient was discharged home without pain or other issues. Dietary and behavioral recommendations were given, along with follow-up instructions. The patient will return as needed.  ? ?No complications were encountered and the patient tolerated the procedure well.  ? ?Annitta Needs, PhD, ANP-BC ?[facility]  ? ?

## 2021-09-04 NOTE — Patient Instructions (Signed)
We will see you back as needed!  ? ?Please let me know if any concerns. ? ?I enjoyed seeing you again today! As you know, I value our relationship and want to provide genuine, compassionate, and quality care. I welcome your feedback. If you receive a survey regarding your visit,  I greatly appreciate you taking time to fill this out. See you next time! ? ?Annitta Needs, PhD, ANP-BC ?[facility]  ? ?

## 2021-09-18 ENCOUNTER — Encounter: Payer: Self-pay | Admitting: Gastroenterology

## 2021-09-18 ENCOUNTER — Ambulatory Visit (INDEPENDENT_AMBULATORY_CARE_PROVIDER_SITE_OTHER): Payer: 59 | Admitting: Gastroenterology

## 2021-09-18 VITALS — BP 122/64 | HR 76 | Temp 97.4°F | Ht 64.0 in | Wt 225.0 lb

## 2021-09-18 DIAGNOSIS — K641 Second degree hemorrhoids: Secondary | ICD-10-CM | POA: Diagnosis not present

## 2021-09-18 NOTE — Progress Notes (Signed)
? ? ?  [place] BANDING PROCEDURE NOTE ? ?Cheryl Hampton is a 36 y.o. female presenting today for consideration of hemorrhoid banding. She has declined colonoscopy. Previous symptoms of low-volume hematochezia, pressure, prolapsing. She has had left lateral, right anterior, and right posterior banding. Notes some itching occasionally. Overall improved.  ? ?The patient presents with symptomatic grade 2 hemorrhoids, unresponsive to maximal medical therapy, requesting rubber band ligation of her hemorrhoidal disease. All risks, benefits, and alternative forms of therapy were described and informed consent was obtained. ? ?In the left lateral decubitus position, anoscopic examination revealed grade 2 hemorrhoids in the left lateral position. Appropriate right anterior and right posterior post-banding ulcers noted.  ? ?The decision was made to band the left lateral internal hemorrhoid, and the [place] was used to perform band ligation without complication. Digital anorectal examination was then performed to assure proper positioning of the band, and to adjust the banded tissue as required. The patient was discharged home without pain or other issues. Dietary and behavioral recommendations were given, along with follow-up instructions. The patient will return as needed.  ? ?No complications were encountered and the patient tolerated the procedure well.  ? ?Annitta Needs, PhD, ANP-BC ?[facility]  ? ?

## 2021-09-18 NOTE — Patient Instructions (Signed)
We will see you back as needed! ? ?Please continue to avoid straining. ? ?You should limit your toilet time to 2-3 minutes at the most.  ? ?Continue to avoid constipation. ? ?Please call me with any concerns or issues! ? ?Annitta Needs, PhD, ANP-BC ?[facility]  ? ? ? ? ? ? ? ? ? ?

## 2021-09-24 ENCOUNTER — Other Ambulatory Visit (HOSPITAL_COMMUNITY): Payer: Self-pay

## 2021-10-30 ENCOUNTER — Other Ambulatory Visit: Payer: Self-pay | Admitting: Internal Medicine

## 2021-10-30 ENCOUNTER — Other Ambulatory Visit (HOSPITAL_COMMUNITY): Payer: Self-pay

## 2021-10-30 MED ORDER — BUTALBITAL-APAP-CAFFEINE 50-325-40 MG PO TABS
1.0000 | ORAL_TABLET | ORAL | 0 refills | Status: DC | PRN
  Filled 2021-10-30: qty 45, 8d supply, fill #0

## 2021-10-30 MED ORDER — VENLAFAXINE HCL ER 150 MG PO CP24
150.0000 mg | ORAL_CAPSULE | Freq: Every day | ORAL | 0 refills | Status: DC
  Filled 2021-10-30: qty 90, 90d supply, fill #0

## 2021-10-30 MED ORDER — LEVOTHYROXINE SODIUM 100 MCG PO TABS
100.0000 ug | ORAL_TABLET | Freq: Every day | ORAL | 0 refills | Status: DC
  Filled 2021-10-30: qty 90, 90d supply, fill #0

## 2021-10-30 MED ORDER — WEGOVY 2.4 MG/0.75ML ~~LOC~~ SOAJ
SUBCUTANEOUS | 2 refills | Status: DC
  Filled 2021-10-30: qty 3, 28d supply, fill #0

## 2021-10-31 ENCOUNTER — Other Ambulatory Visit (HOSPITAL_COMMUNITY): Payer: Self-pay

## 2022-01-22 ENCOUNTER — Encounter: Payer: Self-pay | Admitting: Internal Medicine

## 2022-01-22 NOTE — Patient Instructions (Addendum)
Blood work was ordered.     Medications changes include :   none    A referral was ordered for healthy weight and management clinic.     Someone from that office will call you to schedule an appointment.    Return in about 6 months (around 07/26/2022) for follow up.   Health Maintenance, Female Adopting a healthy lifestyle and getting preventive care are important in promoting health and wellness. Ask your health care provider about: The right schedule for you to have regular tests and exams. Things you can do on your own to prevent diseases and keep yourself healthy. What should I know about diet, weight, and exercise? Eat a healthy diet  Eat a diet that includes plenty of vegetables, fruits, low-fat dairy products, and lean protein. Do not eat a lot of foods that are high in solid fats, added sugars, or sodium. Maintain a healthy weight Body mass index (BMI) is used to identify weight problems. It estimates body fat based on height and weight. Your health care provider can help determine your BMI and help you achieve or maintain a healthy weight. Get regular exercise Get regular exercise. This is one of the most important things you can do for your health. Most adults should: Exercise for at least 150 minutes each week. The exercise should increase your heart rate and make you sweat (moderate-intensity exercise). Do strengthening exercises at least twice a week. This is in addition to the moderate-intensity exercise. Spend less time sitting. Even light physical activity can be beneficial. Watch cholesterol and blood lipids Have your blood tested for lipids and cholesterol at 35 years of age, then have this test every 5 years. Have your cholesterol levels checked more often if: Your lipid or cholesterol levels are high. You are older than 35 years of age. You are at high risk for heart disease. What should I know about cancer screening? Depending on your health history and  family history, you may need to have cancer screening at various ages. This may include screening for: Breast cancer. Cervical cancer. Colorectal cancer. Skin cancer. Lung cancer. What should I know about heart disease, diabetes, and high blood pressure? Blood pressure and heart disease High blood pressure causes heart disease and increases the risk of stroke. This is more likely to develop in people who have high blood pressure readings or are overweight. Have your blood pressure checked: Every 3-5 years if you are 31-12 years of age. Every year if you are 3 years old or older. Diabetes Have regular diabetes screenings. This checks your fasting blood sugar level. Have the screening done: Once every three years after age 47 if you are at a normal weight and have a low risk for diabetes. More often and at a younger age if you are overweight or have a high risk for diabetes. What should I know about preventing infection? Hepatitis B If you have a higher risk for hepatitis B, you should be screened for this virus. Talk with your health care provider to find out if you are at risk for hepatitis B infection. Hepatitis C Testing is recommended for: Everyone born from [birth year] through 1964. Anyone with known risk factors for hepatitis C. Sexually transmitted infections (STIs) Get screened for STIs, including gonorrhea and chlamydia, if: You are sexually active and are younger than 35 years of age. You are older than 35 years of age and your health care provider tells you that you are at risk for  this type of infection. Your sexual activity has changed since you were last screened, and you are at increased risk for chlamydia or gonorrhea. Ask your health care provider if you are at risk. Ask your health care provider about whether you are at high risk for HIV. Your health care provider may recommend a prescription medicine to help prevent HIV infection. If you choose to take medicine to prevent HIV,  you should first get tested for HIV. You should then be tested every 3 months for as long as you are taking the medicine. Pregnancy If you are about to stop having your period (premenopausal) and you may become pregnant, seek counseling before you get pregnant. Take 400 to 800 micrograms (mcg) of folic acid every day if you become pregnant. Ask for birth control (contraception) if you want to prevent pregnancy. Osteoporosis and menopause Osteoporosis is a disease in which the bones lose minerals and strength with aging. This can result in bone fractures. If you are 14 years old or older, or if you are at risk for osteoporosis and fractures, ask your health care provider if you should: Be screened for bone loss. Take a calcium or vitamin D supplement to lower your risk of fractures. Be given hormone replacement therapy (HRT) to treat symptoms of menopause. Follow these instructions at home: Alcohol use Do not drink alcohol if: Your health care provider tells you not to drink. You are pregnant, may be pregnant, or are planning to become pregnant. If you drink alcohol: Limit how much you have to: 0-1 drink a day. Know how much alcohol is in your drink. In the [place] one drink equals one 12 oz bottle of beer (355 mL), one 5 oz glass of wine (148 mL), or one 1 oz glass of hard liquor (44 mL). Lifestyle Do not use any products that contain nicotine or tobacco. These products include cigarettes, chewing tobacco, and vaping devices, such as e-cigarettes. If you need help quitting, ask your health care provider. Do not use street drugs. Do not share needles. Ask your health care provider for help if you need support or information about quitting drugs. General instructions Schedule regular health, dental, and eye exams. Stay current with your vaccines. Tell your health care provider if: You often feel depressed. You have ever been abused or do not feel safe at home. Summary Adopting a healthy  lifestyle and getting preventive care are important in promoting health and wellness. Follow your health care provider's instructions about healthy diet, exercising, and getting tested or screened for diseases. Follow your health care provider's instructions on monitoring your cholesterol and blood pressure. This information is not intended to replace advice given to you by your health care provider. Make sure you discuss any questions you have with your health care provider. Document Revised: 10/28/2020 Document Reviewed: 10/28/2020 Elsevier Patient Education  [place].
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
## 2022-01-23 ENCOUNTER — Ambulatory Visit (INDEPENDENT_AMBULATORY_CARE_PROVIDER_SITE_OTHER): Payer: 59 | Admitting: Internal Medicine

## 2022-01-23 VITALS — BP 122/78 | HR 98 | Temp 98.5°F | Ht 64.0 in | Wt 240.0 lb

## 2022-01-23 DIAGNOSIS — Z136 Encounter for screening for cardiovascular disorders: Secondary | ICD-10-CM

## 2022-01-23 DIAGNOSIS — N9089 Other specified noninflammatory disorders of vulva and perineum: Secondary | ICD-10-CM | POA: Insufficient documentation

## 2022-01-23 DIAGNOSIS — G43809 Other migraine, not intractable, without status migrainosus: Secondary | ICD-10-CM | POA: Diagnosis not present

## 2022-01-23 DIAGNOSIS — Z0000 Encounter for general adult medical examination without abnormal findings: Secondary | ICD-10-CM

## 2022-01-23 DIAGNOSIS — E039 Hypothyroidism, unspecified: Secondary | ICD-10-CM | POA: Diagnosis not present

## 2022-01-23 DIAGNOSIS — I10 Essential (primary) hypertension: Secondary | ICD-10-CM

## 2022-01-23 DIAGNOSIS — R739 Hyperglycemia, unspecified: Secondary | ICD-10-CM | POA: Diagnosis not present

## 2022-01-23 DIAGNOSIS — F3289 Other specified depressive episodes: Secondary | ICD-10-CM | POA: Diagnosis not present

## 2022-01-23 DIAGNOSIS — F419 Anxiety disorder, unspecified: Secondary | ICD-10-CM | POA: Diagnosis not present

## 2022-01-23 DIAGNOSIS — N915 Oligomenorrhea, unspecified: Secondary | ICD-10-CM | POA: Insufficient documentation

## 2022-01-23 DIAGNOSIS — K5900 Constipation, unspecified: Secondary | ICD-10-CM | POA: Insufficient documentation

## 2022-01-23 LAB — CBC WITH DIFFERENTIAL/PLATELET
Basophils Absolute: 0 10*3/uL (ref 0.0–0.1)
Basophils Relative: 0.6 % (ref 0.0–3.0)
Eosinophils Absolute: 0.1 10*3/uL (ref 0.0–0.7)
Eosinophils Relative: 2 % (ref 0.0–5.0)
HCT: 41.9 % (ref 36.0–46.0)
Hemoglobin: 14.2 g/dL (ref 12.0–15.0)
Lymphocytes Relative: 32.3 % (ref 12.0–46.0)
Lymphs Abs: 2.4 10*3/uL (ref 0.7–4.0)
MCHC: 33.9 g/dL (ref 30.0–36.0)
MCV: 93.3 fl (ref 78.0–100.0)
Monocytes Absolute: 0.5 10*3/uL (ref 0.1–1.0)
Monocytes Relative: 7.1 % (ref 3.0–12.0)
Neutro Abs: 4.3 10*3/uL (ref 1.4–7.7)
Neutrophils Relative %: 58 % (ref 43.0–77.0)
Platelets: 447 10*3/uL — ABNORMAL HIGH (ref 150.0–400.0)
RBC: 4.49 Mil/uL (ref 3.87–5.11)
RDW: 13.7 % (ref 11.5–15.5)
WBC: 7.5 10*3/uL (ref 4.0–10.5)

## 2022-01-23 LAB — COMPREHENSIVE METABOLIC PANEL
ALT: 37 U/L — ABNORMAL HIGH (ref 0–35)
AST: 30 U/L (ref 0–37)
Albumin: 4.4 g/dL (ref 3.5–5.2)
Alkaline Phosphatase: 57 U/L (ref 39–117)
BUN: 10 mg/dL (ref 6–23)
CO2: 31 mEq/L (ref 19–32)
Calcium: 9.6 mg/dL (ref 8.4–10.5)
Chloride: 99 mEq/L (ref 96–112)
Creatinine, Ser: 0.7 mg/dL (ref 0.40–1.20)
GFR: 112.56 mL/min (ref >60.00)
Glucose, Bld: 109 mg/dL — ABNORMAL HIGH (ref 70–99)
Potassium: 3.5 mEq/L (ref 3.5–5.1)
Sodium: 136 mEq/L (ref 135–145)
Total Bilirubin: 0.4 mg/dL (ref 0.2–1.2)
Total Protein: 7.9 g/dL (ref 6.0–8.3)

## 2022-01-23 LAB — LIPID PANEL
Cholesterol: 256 mg/dL — ABNORMAL HIGH (ref 0–200)
HDL: 46.2 mg/dL (ref >39.00)
Total CHOL/HDL Ratio: 6
Triglycerides: 408 mg/dL — ABNORMAL HIGH (ref 0.0–149.0)

## 2022-01-23 LAB — TSH: TSH: 1.82 u[IU]/mL (ref 0.35–5.50)

## 2022-01-23 LAB — HEMOGLOBIN A1C: Hgb A1c MFr Bld: 5.7 % (ref 4.6–6.5)

## 2022-01-23 LAB — LDL CHOLESTEROL, DIRECT: Direct LDL: 173 mg/dL

## 2022-01-23 NOTE — Assessment & Plan Note (Signed)
Chronic Blood pressure well controlled CMP, CBC Continue Hyzaar 50-12.5 mg daily

## 2022-01-23 NOTE — Assessment & Plan Note (Signed)
Chronic  Clinically euthyroid Currently taking levothyroxine 100 mcg daily Check tsh  Titrate med dose if needed  

## 2022-01-23 NOTE — Assessment & Plan Note (Addendum)
Chronic Stopped wegovy - started to gain weight on the higher doses and felt like her body got used to the medication  Stressed the importance of regular exercise Stressed healthy diet with lots of protein and vegetables, fruits, less sugar/carbs Discussed the importance of decreased portions Was on qsymia in the past Discussed topamax Referral ordered for the healthy weight [facility] wellness clinic

## 2022-01-23 NOTE — Assessment & Plan Note (Signed)
Chronic Intermittent headaches Continue Fioricet 50-325 mg every 4 hours as needed-discussed to keep this to a minimum

## 2022-01-23 NOTE — Assessment & Plan Note (Signed)
Chronic Controlled, Stable Continue Effexor 225 mg daily

## 2022-01-23 NOTE — Assessment & Plan Note (Signed)
Chronic Check a1c Low sugar / carb diet Stressed regular exercise  

## 2022-01-23 NOTE — Assessment & Plan Note (Addendum)
Chronic BuSpar did not help Anxiety is better Continue Effexor 225 mg daily

## 2022-01-28 ENCOUNTER — Other Ambulatory Visit: Payer: Self-pay | Admitting: Internal Medicine

## 2022-01-28 ENCOUNTER — Other Ambulatory Visit (HOSPITAL_COMMUNITY): Payer: Self-pay

## 2022-01-28 MED ORDER — VENLAFAXINE HCL ER 150 MG PO CP24
150.0000 mg | ORAL_CAPSULE | Freq: Every day | ORAL | 1 refills | Status: DC
  Filled 2022-01-28: qty 90, 90d supply, fill #0
  Filled 2022-04-24: qty 90, 90d supply, fill #1

## 2022-01-28 MED ORDER — VENLAFAXINE HCL ER 75 MG PO CP24
ORAL_CAPSULE | ORAL | 1 refills | Status: DC
  Filled 2022-01-28: qty 90, 90d supply, fill #0
  Filled 2022-04-24: qty 90, 90d supply, fill #1

## 2022-01-28 MED ORDER — LOSARTAN POTASSIUM-HCTZ 50-12.5 MG PO TABS
1.0000 | ORAL_TABLET | Freq: Every day | ORAL | 1 refills | Status: DC
  Filled 2022-01-28: qty 90, 90d supply, fill #0
  Filled 2022-04-24: qty 90, 90d supply, fill #1

## 2022-02-18 ENCOUNTER — Other Ambulatory Visit (HOSPITAL_COMMUNITY): Payer: Self-pay

## 2022-03-03 ENCOUNTER — Other Ambulatory Visit: Payer: Self-pay | Admitting: Internal Medicine

## 2022-03-03 ENCOUNTER — Encounter: Payer: Self-pay | Admitting: Internal Medicine

## 2022-03-03 DIAGNOSIS — Z130 Encounter for screening for diseases of the blood and blood-forming organs and certain disorders involving the immune mechanism: Secondary | ICD-10-CM | POA: Diagnosis not present

## 2022-03-03 DIAGNOSIS — Z1389 Encounter for screening for other disorder: Secondary | ICD-10-CM | POA: Diagnosis not present

## 2022-03-03 DIAGNOSIS — Z01419 Encounter for gynecological examination (general) (routine) without abnormal findings: Secondary | ICD-10-CM | POA: Diagnosis not present

## 2022-03-03 DIAGNOSIS — Z30431 Encounter for routine checking of intrauterine contraceptive device: Secondary | ICD-10-CM | POA: Diagnosis not present

## 2022-03-04 ENCOUNTER — Other Ambulatory Visit (HOSPITAL_COMMUNITY): Payer: Self-pay

## 2022-03-04 MED ORDER — LEVOTHYROXINE SODIUM 100 MCG PO TABS
100.0000 ug | ORAL_TABLET | Freq: Every day | ORAL | 3 refills | Status: DC
  Filled 2022-03-04: qty 90, 90d supply, fill #0
  Filled 2022-06-18: qty 90, 90d supply, fill #1
  Filled 2022-10-23: qty 90, 90d supply, fill #2
  Filled 2023-01-27: qty 90, 90d supply, fill #3

## 2022-03-05 MED ORDER — WEGOVY 0.25 MG/0.5ML ~~LOC~~ SOAJ
0.2500 mg | SUBCUTANEOUS | 0 refills | Status: DC
  Filled 2022-03-05 – 2022-04-14 (×5): qty 2, 28d supply, fill #0

## 2022-03-06 ENCOUNTER — Other Ambulatory Visit (HOSPITAL_COMMUNITY): Payer: Self-pay

## 2022-03-09 ENCOUNTER — Other Ambulatory Visit (HOSPITAL_COMMUNITY): Payer: Self-pay

## 2022-03-11 ENCOUNTER — Other Ambulatory Visit (HOSPITAL_COMMUNITY): Payer: Self-pay

## 2022-03-12 ENCOUNTER — Other Ambulatory Visit (HOSPITAL_COMMUNITY): Payer: Self-pay

## 2022-03-12 ENCOUNTER — Telehealth: Payer: Self-pay

## 2022-03-12 NOTE — Telephone Encounter (Signed)
Cheryl Hampton (Key: [id number])

## 2022-03-28 ENCOUNTER — Other Ambulatory Visit (HOSPITAL_COMMUNITY): Payer: Self-pay

## 2022-03-30 ENCOUNTER — Other Ambulatory Visit (HOSPITAL_COMMUNITY): Payer: Self-pay

## 2022-03-30 MED ORDER — PHENTERMINE HCL 37.5 MG PO CAPS
37.5000 mg | ORAL_CAPSULE | ORAL | 2 refills | Status: DC
  Filled 2022-03-30: qty 30, 30d supply, fill #0
  Filled 2022-04-24 – 2022-04-27 (×2): qty 30, 30d supply, fill #1
  Filled 2022-05-25: qty 30, 30d supply, fill #2

## 2022-03-30 NOTE — Addendum Note (Signed)
Addended by: Binnie Rail on: 03/30/2022 01:27 PM   Modules accepted: Orders

## 2022-04-03 ENCOUNTER — Other Ambulatory Visit (HOSPITAL_COMMUNITY): Payer: Self-pay

## 2022-04-07 ENCOUNTER — Other Ambulatory Visit (HOSPITAL_COMMUNITY): Payer: Self-pay

## 2022-04-09 ENCOUNTER — Other Ambulatory Visit (HOSPITAL_COMMUNITY): Payer: Self-pay

## 2022-04-14 ENCOUNTER — Other Ambulatory Visit (HOSPITAL_COMMUNITY): Payer: Self-pay

## 2022-04-24 ENCOUNTER — Other Ambulatory Visit (HOSPITAL_COMMUNITY): Payer: Self-pay

## 2022-04-27 ENCOUNTER — Other Ambulatory Visit (HOSPITAL_COMMUNITY): Payer: Self-pay

## 2022-05-18 ENCOUNTER — Other Ambulatory Visit (HOSPITAL_COMMUNITY): Payer: Self-pay

## 2022-05-25 ENCOUNTER — Other Ambulatory Visit (HOSPITAL_COMMUNITY): Payer: Self-pay

## 2022-05-26 ENCOUNTER — Other Ambulatory Visit (HOSPITAL_COMMUNITY): Payer: Self-pay

## 2022-05-30 DIAGNOSIS — H52223 Regular astigmatism, bilateral: Secondary | ICD-10-CM | POA: Diagnosis not present

## 2022-05-30 DIAGNOSIS — H5213 Myopia, bilateral: Secondary | ICD-10-CM | POA: Diagnosis not present

## 2022-06-05 ENCOUNTER — Other Ambulatory Visit (HOSPITAL_COMMUNITY): Payer: Self-pay

## 2022-06-09 ENCOUNTER — Ambulatory Visit (INDEPENDENT_AMBULATORY_CARE_PROVIDER_SITE_OTHER): Payer: 59 | Admitting: Psychology

## 2022-06-09 DIAGNOSIS — F4323 Adjustment disorder with mixed anxiety and depressed mood: Secondary | ICD-10-CM | POA: Diagnosis not present
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
## 2022-06-18 ENCOUNTER — Other Ambulatory Visit: Payer: Self-pay | Admitting: Internal Medicine

## 2022-06-19 ENCOUNTER — Other Ambulatory Visit: Payer: Self-pay

## 2022-06-19 ENCOUNTER — Ambulatory Visit (INDEPENDENT_AMBULATORY_CARE_PROVIDER_SITE_OTHER): Payer: 59 | Admitting: Psychology

## 2022-06-19 ENCOUNTER — Other Ambulatory Visit (HOSPITAL_COMMUNITY): Payer: Self-pay

## 2022-06-19 DIAGNOSIS — F3341 Major depressive disorder, recurrent, in partial remission: Secondary | ICD-10-CM
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
## 2022-06-24 ENCOUNTER — Other Ambulatory Visit: Payer: Self-pay | Admitting: Internal Medicine

## 2022-06-25 ENCOUNTER — Other Ambulatory Visit: Payer: Self-pay

## 2022-06-25 ENCOUNTER — Other Ambulatory Visit (HOSPITAL_COMMUNITY): Payer: Self-pay

## 2022-06-25 MED ORDER — PHENTERMINE HCL 37.5 MG PO CAPS
37.5000 mg | ORAL_CAPSULE | ORAL | 0 refills | Status: DC
  Filled 2022-06-25: qty 30, 30d supply, fill #0

## 2022-06-25 NOTE — Telephone Encounter (Signed)
My-chart sent to patient per her request.  She has been made aware she needs appointment next month and she can schedule on line or let me know when she wants to come in.

## 2022-06-25 NOTE — Telephone Encounter (Signed)
Phentermine prescription refill requested-sent for 1 month.  I would like to see her in the office for follow-up for additional refills and to follow-up on her weight loss efforts.

## 2022-06-29 ENCOUNTER — Ambulatory Visit (INDEPENDENT_AMBULATORY_CARE_PROVIDER_SITE_OTHER): Payer: Commercial Managed Care - PPO | Admitting: Psychology

## 2022-06-29 DIAGNOSIS — F3341 Major depressive disorder, recurrent, in partial remission: Secondary | ICD-10-CM | POA: Diagnosis not present
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
## 2022-07-02 ENCOUNTER — Other Ambulatory Visit (HOSPITAL_COMMUNITY): Payer: Self-pay

## 2022-07-06 ENCOUNTER — Ambulatory Visit: Payer: Commercial Managed Care - PPO | Admitting: Psychology

## 2022-07-13 ENCOUNTER — Ambulatory Visit (INDEPENDENT_AMBULATORY_CARE_PROVIDER_SITE_OTHER): Payer: Commercial Managed Care - PPO | Admitting: Psychology

## 2022-07-13 DIAGNOSIS — F3341 Major depressive disorder, recurrent, in partial remission: Secondary | ICD-10-CM | POA: Diagnosis not present
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
## 2022-07-15 ENCOUNTER — Other Ambulatory Visit (HOSPITAL_COMMUNITY): Payer: Self-pay

## 2022-07-17 ENCOUNTER — Encounter: Payer: Self-pay | Admitting: Internal Medicine

## 2022-07-19 MED ORDER — ZEPBOUND 2.5 MG/0.5ML ~~LOC~~ SOAJ
2.5000 mg | SUBCUTANEOUS | 0 refills | Status: DC
  Filled 2022-07-19 – 2022-08-06 (×3): qty 2, 28d supply, fill #0

## 2022-07-20 ENCOUNTER — Other Ambulatory Visit (HOSPITAL_COMMUNITY): Payer: Self-pay

## 2022-07-21 ENCOUNTER — Ambulatory Visit: Payer: Commercial Managed Care - PPO | Admitting: Psychology

## 2022-07-22 ENCOUNTER — Other Ambulatory Visit: Payer: Self-pay | Admitting: Internal Medicine

## 2022-07-22 ENCOUNTER — Other Ambulatory Visit (HOSPITAL_COMMUNITY): Payer: Self-pay

## 2022-07-22 ENCOUNTER — Other Ambulatory Visit: Payer: Self-pay

## 2022-07-22 MED ORDER — LOSARTAN POTASSIUM-HCTZ 50-12.5 MG PO TABS
1.0000 | ORAL_TABLET | Freq: Every day | ORAL | 1 refills | Status: DC
  Filled 2022-07-22: qty 90, 90d supply, fill #0
  Filled 2022-10-23: qty 90, 90d supply, fill #1

## 2022-07-22 MED ORDER — VENLAFAXINE HCL ER 75 MG PO CP24
75.0000 mg | ORAL_CAPSULE | Freq: Every day | ORAL | 1 refills | Status: DC
  Filled 2022-07-22: qty 90, 90d supply, fill #0

## 2022-07-22 MED ORDER — VENLAFAXINE HCL ER 150 MG PO CP24
150.0000 mg | ORAL_CAPSULE | Freq: Every day | ORAL | 1 refills | Status: DC
  Filled 2022-07-22: qty 90, 90d supply, fill #0

## 2022-07-27 ENCOUNTER — Other Ambulatory Visit: Payer: Self-pay

## 2022-07-27 ENCOUNTER — Ambulatory Visit (INDEPENDENT_AMBULATORY_CARE_PROVIDER_SITE_OTHER): Payer: Commercial Managed Care - PPO | Admitting: Psychology

## 2022-07-27 DIAGNOSIS — F3341 Major depressive disorder, recurrent, in partial remission: Secondary | ICD-10-CM
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
## 2022-07-28 ENCOUNTER — Other Ambulatory Visit (HOSPITAL_COMMUNITY): Payer: Self-pay

## 2022-07-30 ENCOUNTER — Other Ambulatory Visit (HOSPITAL_COMMUNITY): Payer: Self-pay

## 2022-08-03 NOTE — Progress Notes (Signed)
      Subjective:    Patient ID: Cheryl Hampton, female    DOB: [birth year], 37 y.o.   MRN: [id number]     HPI Cheryl Hampton is here for follow up of her chronic medical problems, including htn, hld, prediabetes, migraines, hypothyroidism, depression, anxiety, obesity  ? labs  Medications and allergies reviewed with patient and updated if appropriate.  Current Outpatient Medications on File Prior to Visit  Medication Sig Dispense Refill   butalbital-acetaminophen-caffeine (FIORICET) 50-325-40 MG tablet Take 1 tablet by mouth every 4 (four) hours as needed for headache. 45 tablet 0   levonorgestrel (MIRENA, 52 MG,) 20 MCG/DAY IUD Mirena 21 mcg/24 hours (8 yrs) 52 mg intrauterine device  Take 1 device every day by intrauterine route.     levothyroxine (SYNTHROID) 100 MCG tablet Take 1 tablet by mouth daily. Annual appt due in June must see provider for future refills 90 tablet 3   losartan-hydrochlorothiazide (HYZAAR) 50-12.5 MG tablet Take 1 tablet by mouth daily. 90 tablet 1   phentermine 37.5 MG capsule Take 1 capsule (37.5 mg total) by mouth every morning. 30 capsule 0   tirzepatide (ZEPBOUND) 2.5 MG/0.5ML Pen Inject 2.5 mg into the skin once a week. 2 mL 0   venlafaxine XR (EFFEXOR-XR) 150 MG 24 hr capsule Take 1 capsule by mouth daily with breakfast. Annual appt due in June must see provider for future refills 90 capsule 1   venlafaxine XR (EFFEXOR-XR) 75 MG 24 hr capsule Take 1 capsule (75 mg total) by mouth daily with breakfast. (TOTAL OF 225 MG DAILY) 90 capsule 1   No current facility-administered medications on file prior to visit.     Review of Systems     Objective:  There were no vitals filed for this visit. BP Readings from Last 3 Encounters:  01/23/22 122/78  09/18/21 122/64  09/04/21 118/81   Wt Readings from Last 3 Encounters:  01/23/22 240 lb (108.9 kg)  09/18/21 225 lb (102.1 kg)  09/04/21 225 lb (102.1 kg)   There is no height or weight on file to  calculate BMI.    Physical Exam     Lab Results  Component Value Date   WBC 7.5 01/23/2022   HGB 14.2 01/23/2022   HCT 41.9 01/23/2022   PLT 447.0 (H) 01/23/2022   GLUCOSE 109 (H) 01/23/2022   CHOL 256 (H) 01/23/2022   TRIG (H) 01/23/2022    408.0 Triglyceride is over 400; calculations on Lipids are invalid.   HDL 46.20 01/23/2022   LDLDIRECT 173.0 01/23/2022   LDLCALC 86 08/06/2017   ALT 37 (H) 01/23/2022   AST 30 01/23/2022   NA 136 01/23/2022   K 3.5 01/23/2022   CL 99 01/23/2022   CREATININE 0.70 01/23/2022   BUN 10 01/23/2022   CO2 31 01/23/2022   TSH 1.82 01/23/2022   INR 0.9 02/15/2020   HGBA1C 5.7 01/23/2022     Assessment & Plan:    See Problem List for Assessment and Plan of chronic medical problems.

## 2022-08-03 NOTE — Assessment & Plan Note (Signed)
chronic Awaiting for Zepbound to see if it is approved by [employer] is approved but not available Discussed that these medications are okay needs and ideally she should remain on the low-dose but focus on lifestyle changes She is active at work which helps-unfortunately not sure when she feels the fact regular exercising, but stressed doing more activity on the weekends Discussed eating more regularly-trying to eat 3 meals a day high in protein, fiber and vegetables To establish with a healthy weight and wellness clinic today

## 2022-08-03 NOTE — Patient Instructions (Signed)
      Blood work was ordered.   The lab is on the first floor.    Medications changes include :       A referral was ordered for XXX.     Someone will call you to schedule an appointment.    No follow-ups on file.

## 2022-08-04 ENCOUNTER — Ambulatory Visit: Payer: Commercial Managed Care - PPO | Admitting: Psychology

## 2022-08-04 ENCOUNTER — Encounter (INDEPENDENT_AMBULATORY_CARE_PROVIDER_SITE_OTHER): Payer: Self-pay | Admitting: Family Medicine

## 2022-08-04 ENCOUNTER — Encounter (INDEPENDENT_AMBULATORY_CARE_PROVIDER_SITE_OTHER): Payer: Commercial Managed Care - PPO | Admitting: Family Medicine

## 2022-08-04 ENCOUNTER — Encounter (INDEPENDENT_AMBULATORY_CARE_PROVIDER_SITE_OTHER): Payer: Self-pay

## 2022-08-04 ENCOUNTER — Encounter: Payer: Self-pay | Admitting: Internal Medicine

## 2022-08-04 ENCOUNTER — Ambulatory Visit (INDEPENDENT_AMBULATORY_CARE_PROVIDER_SITE_OTHER): Payer: Commercial Managed Care - PPO | Admitting: Internal Medicine

## 2022-08-04 VITALS — BP 118/80 | HR 90 | Temp 98.6°F | Ht 64.0 in | Wt 243.0 lb

## 2022-08-04 DIAGNOSIS — F419 Anxiety disorder, unspecified: Secondary | ICD-10-CM

## 2022-08-04 DIAGNOSIS — I10 Essential (primary) hypertension: Secondary | ICD-10-CM

## 2022-08-04 DIAGNOSIS — F3289 Other specified depressive episodes: Secondary | ICD-10-CM

## 2022-08-04 DIAGNOSIS — E039 Hypothyroidism, unspecified: Secondary | ICD-10-CM | POA: Diagnosis not present

## 2022-08-04 DIAGNOSIS — G43809 Other migraine, not intractable, without status migrainosus: Secondary | ICD-10-CM

## 2022-08-04 NOTE — Assessment & Plan Note (Signed)
Chronic Last TSH in the normal range and she is euthyroid today Continue levothyroxine 100 mcg daily

## 2022-08-04 NOTE — Assessment & Plan Note (Signed)
Chronic Controlled, Stable Continue venlafaxine to 25 mg daily Currently seeing Dr. Cheryln Manly

## 2022-08-04 NOTE — Assessment & Plan Note (Signed)
Chronic BP well controlled Continue hyzaar 50-12.5 mg daily

## 2022-08-04 NOTE — Assessment & Plan Note (Signed)
Chronic Controlled, Stable Continue venlafaxine to 25 mg daily, seen Dr. Cheryln Manly

## 2022-08-06 ENCOUNTER — Telehealth: Payer: Self-pay

## 2022-08-06 ENCOUNTER — Other Ambulatory Visit: Payer: Self-pay

## 2022-08-06 NOTE — Telephone Encounter (Signed)
Patient Advocate Encounter  Prior Authorization for Zepbound 2.5 MG/0.5ML Pen  has been approved.    PA# [id number] Effective dates: 08/05/22 through 02/01/23

## 2022-08-10 ENCOUNTER — Ambulatory Visit: Payer: Commercial Managed Care - PPO | Admitting: Psychology

## 2022-08-11 ENCOUNTER — Ambulatory Visit (INDEPENDENT_AMBULATORY_CARE_PROVIDER_SITE_OTHER): Payer: Commercial Managed Care - PPO | Admitting: Psychology

## 2022-08-11 DIAGNOSIS — F3341 Major depressive disorder, recurrent, in partial remission: Secondary | ICD-10-CM | POA: Diagnosis not present
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
## 2022-08-14 ENCOUNTER — Other Ambulatory Visit (HOSPITAL_COMMUNITY): Payer: Self-pay

## 2022-08-17 DIAGNOSIS — S338XXA Sprain of other parts of lumbar spine and pelvis, initial encounter: Secondary | ICD-10-CM | POA: Diagnosis not present

## 2022-08-17 DIAGNOSIS — S134XXA Sprain of ligaments of cervical spine, initial encounter: Secondary | ICD-10-CM | POA: Diagnosis not present

## 2022-08-17 DIAGNOSIS — S233XXA Sprain of ligaments of thoracic spine, initial encounter: Secondary | ICD-10-CM | POA: Diagnosis not present

## 2022-08-20 ENCOUNTER — Other Ambulatory Visit (HOSPITAL_COMMUNITY): Payer: Self-pay

## 2022-08-20 ENCOUNTER — Encounter: Payer: Self-pay | Admitting: Internal Medicine

## 2022-08-20 ENCOUNTER — Other Ambulatory Visit: Payer: Self-pay

## 2022-08-20 MED ORDER — ZEPBOUND 2.5 MG/0.5ML ~~LOC~~ SOAJ
2.5000 mg | SUBCUTANEOUS | 0 refills | Status: DC
  Filled 2022-08-20 – 2022-08-28 (×3): qty 2, 28d supply, fill #0

## 2022-08-21 ENCOUNTER — Ambulatory Visit: Payer: Commercial Managed Care - PPO | Admitting: Psychology

## 2022-08-21 DIAGNOSIS — S233XXA Sprain of ligaments of thoracic spine, initial encounter: Secondary | ICD-10-CM | POA: Diagnosis not present

## 2022-08-21 DIAGNOSIS — S338XXA Sprain of other parts of lumbar spine and pelvis, initial encounter: Secondary | ICD-10-CM | POA: Diagnosis not present

## 2022-08-21 DIAGNOSIS — S134XXA Sprain of ligaments of cervical spine, initial encounter: Secondary | ICD-10-CM | POA: Diagnosis not present

## 2022-08-24 ENCOUNTER — Other Ambulatory Visit (HOSPITAL_COMMUNITY): Payer: Self-pay

## 2022-08-24 DIAGNOSIS — Z1283 Encounter for screening for malignant neoplasm of skin: Secondary | ICD-10-CM | POA: Diagnosis not present

## 2022-08-24 DIAGNOSIS — L905 Scar conditions and fibrosis of skin: Secondary | ICD-10-CM | POA: Diagnosis not present

## 2022-08-24 DIAGNOSIS — D225 Melanocytic nevi of trunk: Secondary | ICD-10-CM | POA: Diagnosis not present

## 2022-08-24 DIAGNOSIS — D485 Neoplasm of uncertain behavior of skin: Secondary | ICD-10-CM | POA: Diagnosis not present

## 2022-08-28 ENCOUNTER — Other Ambulatory Visit (HOSPITAL_COMMUNITY): Payer: Self-pay

## 2022-08-29 ENCOUNTER — Other Ambulatory Visit (HOSPITAL_COMMUNITY): Payer: Self-pay

## 2022-09-08 ENCOUNTER — Other Ambulatory Visit: Payer: Self-pay | Admitting: Gastroenterology

## 2022-09-08 MED ORDER — AMOXICILLIN-POT CLAVULANATE 875-125 MG PO TABS: 1.0000 | ORAL_TABLET | Freq: Two times a day (BID) | ORAL | 0 refills | Status: AC

## 2022-09-28 ENCOUNTER — Other Ambulatory Visit: Payer: Self-pay

## 2022-09-28 ENCOUNTER — Other Ambulatory Visit (HOSPITAL_COMMUNITY): Payer: Self-pay

## 2022-09-28 ENCOUNTER — Encounter: Payer: Self-pay | Admitting: Internal Medicine

## 2022-09-28 MED ORDER — TIRZEPATIDE-WEIGHT MANAGEMENT 5 MG/0.5ML ~~LOC~~ SOAJ
5.0000 mg | SUBCUTANEOUS | 0 refills | Status: DC
  Filled 2022-09-28: qty 2, 28d supply, fill #0

## 2022-09-29 ENCOUNTER — Other Ambulatory Visit (HOSPITAL_COMMUNITY): Payer: Self-pay

## 2022-10-01 ENCOUNTER — Encounter (INDEPENDENT_AMBULATORY_CARE_PROVIDER_SITE_OTHER): Payer: Commercial Managed Care - PPO | Admitting: Family Medicine

## 2022-10-06 ENCOUNTER — Encounter (INDEPENDENT_AMBULATORY_CARE_PROVIDER_SITE_OTHER): Payer: Commercial Managed Care - PPO | Admitting: Internal Medicine

## 2022-10-11 ENCOUNTER — Encounter: Payer: Self-pay | Admitting: Internal Medicine

## 2022-10-11 MED ORDER — QSYMIA 3.75-23 MG PO CP24
ORAL_CAPSULE | ORAL | 0 refills | Status: DC
  Filled 2022-10-11: qty 42, 28d supply, fill #0

## 2022-10-11 NOTE — Telephone Encounter (Signed)
Please see the MyChart message reply(ies) for my assessment and plan.   The patient gave consent for this Medical Advice Message and is aware that it may result in a bill to their insurance company as well as the possibility that this may result in a co-payment or deductible. They are an established patient, but are not seeking medical advice exclusively about a problem treated during an in person or video visit in the last 7 days. I did not recommend an in person or video visit within 7 days of my reply.   I spent a total of 7 minutes cumulative time within 7 days through MyChart messaging  Valta Dillon J Jazara Swiney, MD   

## 2022-10-11 NOTE — Addendum Note (Signed)
Addended by: Pincus Sanes on: 10/11/2022 06:55 AM   Modules accepted: Orders

## 2022-10-12 ENCOUNTER — Other Ambulatory Visit: Payer: Self-pay

## 2022-10-12 ENCOUNTER — Other Ambulatory Visit (HOSPITAL_COMMUNITY): Payer: Self-pay

## 2022-10-16 ENCOUNTER — Telehealth: Payer: Self-pay

## 2022-10-16 ENCOUNTER — Other Ambulatory Visit (HOSPITAL_COMMUNITY): Payer: Self-pay

## 2022-10-19 ENCOUNTER — Telehealth: Payer: Self-pay

## 2022-10-19 ENCOUNTER — Other Ambulatory Visit (HOSPITAL_COMMUNITY): Payer: Self-pay

## 2022-10-19 NOTE — Telephone Encounter (Signed)
Created new encounter for PA. Will route back to pool once determination is received.  

## 2022-10-19 NOTE — Telephone Encounter (Signed)
Pharmacy Patient Advocate Encounter   Received notification from [facility] that prior authorization for Qsymia 3.75-23MG  er capsules is required/requested.  Per Test Claim: PA required   PA submitted on 10/19/22 to (ins) [employer] via CoverMyMeds Key or (Medicaid) confirmation # [id number] Status is pending

## 2022-10-20 ENCOUNTER — Other Ambulatory Visit: Payer: Self-pay

## 2022-10-20 ENCOUNTER — Other Ambulatory Visit (HOSPITAL_COMMUNITY): Payer: Self-pay

## 2022-10-20 NOTE — Telephone Encounter (Signed)
Patient Advocate Encounter  Prior Authorization for Qsymia 3.75-23MG  er capsules  has been approved with [employer].       Per [facility] test claim, copay for 28 days supply is $100  PA# [id number] Effective dates: 10/19/22 through 11/02/22

## 2022-10-20 NOTE — Telephone Encounter (Signed)
Message sent to patient via mychart

## 2022-10-21 ENCOUNTER — Other Ambulatory Visit (HOSPITAL_COMMUNITY): Payer: Self-pay

## 2022-10-21 ENCOUNTER — Other Ambulatory Visit: Payer: Self-pay

## 2022-10-21 MED ORDER — QSYMIA 3.75-23 MG PO CP24
ORAL_CAPSULE | ORAL | 0 refills | Status: DC
  Filled 2022-10-21: qty 30, fill #0
  Filled 2022-10-21: qty 30, 30d supply, fill #0

## 2022-10-21 NOTE — Addendum Note (Signed)
Addended by: Pincus Sanes on: 10/21/2022 05:22 AM   Modules accepted: Orders

## 2022-10-22 ENCOUNTER — Other Ambulatory Visit: Payer: Self-pay

## 2022-10-22 MED ORDER — TOPIRAMATE 25 MG PO TABS
25.0000 mg | ORAL_TABLET | Freq: Every day | ORAL | 0 refills | Status: DC
  Filled 2022-10-22: qty 30, 30d supply, fill #0

## 2022-10-22 MED ORDER — PHENTERMINE HCL 15 MG PO CAPS
15.0000 mg | ORAL_CAPSULE | ORAL | 0 refills | Status: DC
  Filled 2022-10-22: qty 30, 30d supply, fill #0

## 2022-10-22 NOTE — Addendum Note (Signed)
Addended by: Pincus Sanes on: 10/22/2022 07:52 AM   Modules accepted: Orders

## 2022-10-23 ENCOUNTER — Other Ambulatory Visit (HOSPITAL_COMMUNITY): Payer: Self-pay

## 2022-11-17 ENCOUNTER — Other Ambulatory Visit (HOSPITAL_COMMUNITY): Payer: Self-pay

## 2022-11-19 ENCOUNTER — Encounter: Payer: Self-pay | Admitting: Internal Medicine

## 2022-11-19 DIAGNOSIS — E785 Hyperlipidemia, unspecified: Secondary | ICD-10-CM | POA: Insufficient documentation

## 2022-11-19 NOTE — Progress Notes (Unsigned)
Subjective:    Patient ID: Cheryl Hampton, female    DOB: 28-Apr-1986, 37 y.o.   MRN: [id number]     HPI Cheryl Hampton is here for follow up of her chronic medical problems.  Qsymia at lowest dose was covered for 1 pill a day, but co-pay $100 per month.  Now taking phentermine and topamax.  She denies side effects.  It is working some.  She has been on phentermine in the past-felt like the higher dose worked a little bit better.  Has increased exercise.  Has changed diet.  She does feel tired later in the day and tends to feel more hungry later in the day.  She has lost weight.  Medications and allergies reviewed with patient and updated if appropriate.  Current Outpatient Medications on File Prior to Visit  Medication Sig Dispense Refill   butalbital-acetaminophen-caffeine (FIORICET) 50-325-40 MG tablet Take 1 tablet by mouth every 4 (four) hours as needed for headache. 45 tablet 0   levonorgestrel (MIRENA, 52 MG,) 20 MCG/DAY IUD Mirena 21 mcg/24 hours (8 yrs) 52 mg intrauterine device  Take 1 device every day by intrauterine route.     levothyroxine (SYNTHROID) 100 MCG tablet Take 1 tablet by mouth daily. Annual appt due in June must see provider for future refills 90 tablet 3   losartan-hydrochlorothiazide (HYZAAR) 50-12.5 MG tablet Take 1 tablet by mouth daily. 90 tablet 1   phentermine 15 MG capsule Take 1 capsule (15 mg total) by mouth every morning. 30 capsule 0   topiramate (TOPAMAX) 25 MG tablet Take 1 tablet (25 mg total) by mouth daily. 30 tablet 0   venlafaxine XR (EFFEXOR-XR) 150 MG 24 hr capsule Take 1 capsule by mouth daily with breakfast. Annual appt due in June must see provider for future refills 90 capsule 1   venlafaxine XR (EFFEXOR-XR) 75 MG 24 hr capsule Take 1 capsule (75 mg total) by mouth daily with breakfast. (TOTAL OF 225 MG DAILY) 90 capsule 1   No current facility-administered medications on file prior to visit.     Review of Systems  Respiratory:   Negative for shortness of breath.   Cardiovascular:  Negative for chest pain and palpitations.  Neurological:  Negative for headaches.       Objective:   Vitals:   11/20/22 1257  BP: 122/82  Pulse: 95  Temp: 99 F (37.2 C)  SpO2: 99%   BP Readings from Last 3 Encounters:  11/20/22 122/82  08/04/22 118/80  01/23/22 122/78   Wt Readings from Last 3 Encounters:  11/20/22 227 lb (103 kg)  08/04/22 243 lb (110.2 kg)  01/23/22 240 lb (108.9 kg)   Body mass index is 38.96 kg/m.    Physical Exam Constitutional:      General: She is not in acute distress.    Appearance: Normal appearance. She is not ill-appearing.  HENT:     Head: Normocephalic and atraumatic.  Skin:    General: Skin is warm and dry.  Neurological:     Mental Status: She is alert. Mental status is at baseline.  Psychiatric:        Mood and Affect: Mood normal.        Behavior: Behavior normal.        Thought Content: Thought content normal.        Judgment: Judgment normal.        Lab Results  Component Value Date   WBC 7.5 01/23/2022   HGB 14.2  01/23/2022   HCT 41.9 01/23/2022   PLT 447.0 (H) 01/23/2022   GLUCOSE 109 (H) 01/23/2022   CHOL 256 (H) 01/23/2022   TRIG (H) 01/23/2022    408.0 Triglyceride is over 400; calculations on Lipids are invalid.   HDL 46.20 01/23/2022   LDLDIRECT 173.0 01/23/2022   LDLCALC 86 08/06/2017   ALT 37 (H) 01/23/2022   AST 30 01/23/2022   NA 136 01/23/2022   K 3.5 01/23/2022   CL 99 01/23/2022   CREATININE 0.70 01/23/2022   BUN 10 01/23/2022   CO2 31 01/23/2022   TSH 1.82 01/23/2022   INR 0.9 02/15/2020   HGBA1C 5.7 01/23/2022     Assessment & Plan:    See Problem List for Assessment and Plan of chronic medical problems.

## 2022-11-19 NOTE — Patient Instructions (Addendum)
      Blood work was ordered.   The lab is on the first floor.    Medications changes include :   topamax 25 mg twice a day      Return in about 6 months (around 05/22/2023) for Physical Exam.

## 2022-11-20 ENCOUNTER — Encounter: Payer: Self-pay | Admitting: Internal Medicine

## 2022-11-20 ENCOUNTER — Ambulatory Visit: Payer: Commercial Managed Care - PPO | Admitting: Internal Medicine

## 2022-11-20 ENCOUNTER — Other Ambulatory Visit: Payer: Self-pay

## 2022-11-20 ENCOUNTER — Other Ambulatory Visit (HOSPITAL_COMMUNITY): Payer: Self-pay

## 2022-11-20 DIAGNOSIS — G43809 Other migraine, not intractable, without status migrainosus: Secondary | ICD-10-CM | POA: Diagnosis not present

## 2022-11-20 DIAGNOSIS — R7303 Prediabetes: Secondary | ICD-10-CM | POA: Diagnosis not present

## 2022-11-20 DIAGNOSIS — I10 Essential (primary) hypertension: Secondary | ICD-10-CM

## 2022-11-20 DIAGNOSIS — E039 Hypothyroidism, unspecified: Secondary | ICD-10-CM

## 2022-11-20 DIAGNOSIS — E782 Mixed hyperlipidemia: Secondary | ICD-10-CM

## 2022-11-20 DIAGNOSIS — F3289 Other specified depressive episodes: Secondary | ICD-10-CM

## 2022-11-20 DIAGNOSIS — F419 Anxiety disorder, unspecified: Secondary | ICD-10-CM

## 2022-11-20 MED ORDER — PHENTERMINE HCL 15 MG PO CAPS
15.0000 mg | ORAL_CAPSULE | ORAL | 5 refills | Status: DC
  Filled 2022-11-20: qty 30, 30d supply, fill #0
  Filled 2022-12-28: qty 30, 30d supply, fill #1
  Filled 2023-01-27: qty 30, 30d supply, fill #2
  Filled 2023-03-03: qty 30, 30d supply, fill #3
  Filled 2023-04-12: qty 30, 30d supply, fill #4
  Filled 2023-05-14: qty 30, 30d supply, fill #5

## 2022-11-20 MED ORDER — VENLAFAXINE HCL ER 75 MG PO CP24
75.0000 mg | ORAL_CAPSULE | Freq: Every day | ORAL | 1 refills | Status: DC
  Filled 2022-11-20: qty 90, 90d supply, fill #0
  Filled 2023-04-12: qty 90, 90d supply, fill #1

## 2022-11-20 MED ORDER — TOPIRAMATE 25 MG PO TABS
25.0000 mg | ORAL_TABLET | Freq: Two times a day (BID) | ORAL | 5 refills | Status: DC
  Filled 2022-11-20: qty 60, 30d supply, fill #0
  Filled 2022-12-28: qty 60, 30d supply, fill #1
  Filled 2023-01-27: qty 60, 30d supply, fill #2

## 2022-11-20 NOTE — Assessment & Plan Note (Signed)
Chronic Controlled, Stable Has decreased her dose because she was feeling well and feels good with that Continue venlafaxine 75 mg daily No longer seeing Dr. Dellia Cloud

## 2022-11-20 NOTE — Assessment & Plan Note (Signed)
Chronic BP well controlled Continue hyzaar 50-12.5 mg daily 

## 2022-11-20 NOTE — Assessment & Plan Note (Signed)
Chronic Many medications have not been covered Currently on phentermine 15 mg daily and topamax 25 mg daily No side effects She does feel hungry at the end of the day - the higher doses of phentermine in the past helped more  Less motivated She is losing weight  Will try increase topamax to 25 mg bid Stressed continuing regular exercise, smaller portions and healthy diet

## 2022-11-20 NOTE — Assessment & Plan Note (Signed)
Chronic Controlled, Stable She did decrease her dose and is feeling good on her current dose Continue venlafaxine 75 mg daily No longer seeing Dr. Dellia Cloud

## 2022-11-22 NOTE — Assessment & Plan Note (Signed)
Chronic Check a1c Low sugar / carb diet Stressed regular exercise  

## 2022-11-22 NOTE — Assessment & Plan Note (Signed)
Chronic Intermittent headaches Continue Fioricet 50-325 mg every 4 hours as needed

## 2022-11-22 NOTE — Assessment & Plan Note (Signed)
Chronic  Clinically euthyroid Check tsh and will titrate med dose if needed Currently taking levothyroxine 100 mcg daily   

## 2022-11-22 NOTE — Assessment & Plan Note (Signed)
Chronic Regular exercise and healthy diet encouraged Check lipid panel  Continue lifestyle control 

## 2022-12-28 ENCOUNTER — Other Ambulatory Visit (HOSPITAL_COMMUNITY): Payer: Self-pay

## 2022-12-28 ENCOUNTER — Other Ambulatory Visit: Payer: Self-pay

## 2023-01-27 ENCOUNTER — Other Ambulatory Visit: Payer: Self-pay | Admitting: Internal Medicine

## 2023-01-27 ENCOUNTER — Other Ambulatory Visit (HOSPITAL_COMMUNITY): Payer: Self-pay

## 2023-01-27 MED ORDER — LOSARTAN POTASSIUM-HCTZ 50-12.5 MG PO TABS
1.0000 | ORAL_TABLET | Freq: Every day | ORAL | 1 refills | Status: DC
  Filled 2023-01-27: qty 90, 90d supply, fill #0
  Filled 2023-05-14: qty 90, 90d supply, fill #1

## 2023-01-28 ENCOUNTER — Other Ambulatory Visit: Payer: Self-pay

## 2023-02-08 ENCOUNTER — Telehealth: Payer: Commercial Managed Care - PPO | Admitting: Family Medicine

## 2023-02-08 DIAGNOSIS — J019 Acute sinusitis, unspecified: Secondary | ICD-10-CM

## 2023-02-08 DIAGNOSIS — B9689 Other specified bacterial agents as the cause of diseases classified elsewhere: Secondary | ICD-10-CM

## 2023-02-08 MED ORDER — AMOXICILLIN-POT CLAVULANATE 875-125 MG PO TABS
1.0000 | ORAL_TABLET | Freq: Two times a day (BID) | ORAL | 0 refills | Status: AC
Start: 2023-02-08 — End: 2023-02-15

## 2023-02-08 NOTE — Progress Notes (Signed)

## 2023-03-03 ENCOUNTER — Other Ambulatory Visit (HOSPITAL_COMMUNITY): Payer: Self-pay

## 2023-03-03 ENCOUNTER — Other Ambulatory Visit: Payer: Self-pay

## 2023-03-26 DIAGNOSIS — Z1389 Encounter for screening for other disorder: Secondary | ICD-10-CM | POA: Diagnosis not present

## 2023-03-26 DIAGNOSIS — N921 Excessive and frequent menstruation with irregular cycle: Secondary | ICD-10-CM | POA: Diagnosis not present

## 2023-03-26 DIAGNOSIS — Z01411 Encounter for gynecological examination (general) (routine) with abnormal findings: Secondary | ICD-10-CM | POA: Diagnosis not present

## 2023-03-26 DIAGNOSIS — Z30431 Encounter for routine checking of intrauterine contraceptive device: Secondary | ICD-10-CM | POA: Diagnosis not present

## 2023-03-26 DIAGNOSIS — Z130 Encounter for screening for diseases of the blood and blood-forming organs and certain disorders involving the immune mechanism: Secondary | ICD-10-CM | POA: Diagnosis not present

## 2023-04-12 ENCOUNTER — Other Ambulatory Visit (HOSPITAL_COMMUNITY): Payer: Self-pay

## 2023-04-16 DIAGNOSIS — T8332XA Displacement of intrauterine contraceptive device, initial encounter: Secondary | ICD-10-CM | POA: Diagnosis not present

## 2023-05-14 ENCOUNTER — Other Ambulatory Visit: Payer: Self-pay

## 2023-05-14 ENCOUNTER — Other Ambulatory Visit (HOSPITAL_COMMUNITY): Payer: Self-pay

## 2023-05-19 ENCOUNTER — Encounter: Payer: Self-pay | Admitting: Internal Medicine

## 2023-05-19 ENCOUNTER — Other Ambulatory Visit (HOSPITAL_COMMUNITY): Payer: Self-pay

## 2023-05-19 ENCOUNTER — Other Ambulatory Visit: Payer: Self-pay

## 2023-05-19 MED ORDER — LOSARTAN POTASSIUM-HCTZ 50-12.5 MG PO TABS: 1.0000 | ORAL_TABLET | Freq: Every day | ORAL | 0 refills | Status: DC

## 2023-05-24 ENCOUNTER — Other Ambulatory Visit (HOSPITAL_COMMUNITY): Payer: Self-pay

## 2023-05-27 ENCOUNTER — Ambulatory Visit (INDEPENDENT_AMBULATORY_CARE_PROVIDER_SITE_OTHER): Payer: Commercial Managed Care - PPO | Admitting: Gastroenterology

## 2023-05-27 VITALS — BP 133/76 | HR 94 | Temp 98.2°F | Ht 64.0 in | Wt 231.6 lb

## 2023-05-27 DIAGNOSIS — K641 Second degree hemorrhoids: Secondary | ICD-10-CM

## 2023-05-27 DIAGNOSIS — K642 Third degree hemorrhoids: Secondary | ICD-10-CM

## 2023-05-27 NOTE — Patient Instructions (Signed)
  Please avoid straining.  You should limit your toilet time to 2-3 minutes at the most.   I recommend Benefiber 2 teaspoons each morning in the beverage of your choice!  Please call me with any concerns or issues!  I will see you as needed!   Merry Christmas!

## 2023-05-27 NOTE — Progress Notes (Signed)
      [facility] BANDING PROCEDURE NOTE  Cheryl Hampton is a 37 y.o. female presenting today for consideration of hemorrhoid banding. She has a history of banding in 2023 of all 3 columns. Main concern today is prolapsing tissue. Endorses some itching, frequent wiping.    The patient presents with symptomatic grade 3 hemorrhoids, unresponsive to maximal medical therapy, requesting rubber band ligation of her hemorrhoidal disease. All risks, benefits, and alternative forms of therapy were described and informed consent was obtained.  In the left lateral decubitus position, anoscopic examination revealed grade 2 hemorrhoids in the right posterior and grade 3 hemorrhoids in the right anterior position (s).  The decision was made to band the right anterior internal hemorrhoid, and the [facility] was used to perform band ligation without complication. Digital anorectal examination was then performed to assure proper positioning of the band, and to adjust the banded tissue as required. The patient was discharged home without pain or other issues. Dietary and behavioral recommendations were given, along with follow-up instructions. The patient will return as needed.   No complications were encountered and the patient tolerated the procedure well.   Gelene Mink, PhD, ANP-BC [facility]

## 2023-05-28 ENCOUNTER — Other Ambulatory Visit: Payer: Self-pay

## 2023-05-28 ENCOUNTER — Other Ambulatory Visit (HOSPITAL_COMMUNITY): Payer: Self-pay

## 2023-05-28 ENCOUNTER — Other Ambulatory Visit: Payer: Self-pay | Admitting: Internal Medicine

## 2023-05-28 MED ORDER — LEVOTHYROXINE SODIUM 100 MCG PO TABS
100.0000 ug | ORAL_TABLET | Freq: Every day | ORAL | 3 refills | Status: DC
  Filled 2023-05-28: qty 90, 90d supply, fill #0
  Filled 2023-09-06: qty 90, 90d supply, fill #1
  Filled 2023-12-08: qty 90, 90d supply, fill #2

## 2023-05-28 MED ORDER — BUTALBITAL-APAP-CAFFEINE 50-325-40 MG PO TABS
1.0000 | ORAL_TABLET | ORAL | 0 refills | Status: AC | PRN
  Filled 2023-05-28: qty 45, 8d supply, fill #0

## 2023-06-21 ENCOUNTER — Telehealth: Payer: Commercial Managed Care - PPO | Admitting: Physician Assistant

## 2023-06-21 DIAGNOSIS — J019 Acute sinusitis, unspecified: Secondary | ICD-10-CM | POA: Diagnosis not present

## 2023-06-21 DIAGNOSIS — B9789 Other viral agents as the cause of diseases classified elsewhere: Secondary | ICD-10-CM

## 2023-06-21 MED ORDER — FLUTICASONE PROPIONATE 50 MCG/ACT NA SUSP
2.0000 | Freq: Every day | NASAL | 0 refills | Status: DC
Start: 2023-06-21 — End: 2023-07-15

## 2023-06-21 NOTE — Progress Notes (Signed)
E-Visit for Sinus Problems  We are sorry that you are not feeling well.  Here is how we plan to help!  Based on what you have shared with me it looks like you have sinusitis.  Sinusitis is inflammation and infection in the sinus cavities of the head.  Based on your presentation I believe you most likely have Acute Viral Sinusitis.This is an infection most likely caused by a virus. There is not specific treatment for viral sinusitis other than to help you with the symptoms until the infection runs its course.  You may use an oral decongestant such as Mucinex D or if you have glaucoma or high blood pressure use plain Mucinex. Saline nasal spray help and can safely be used as often as needed for congestion, I have prescribed: Fluticasone nasal spray two sprays in each nostril once a day  Some authorities believe that zinc sprays or the use of Echinacea may shorten the course of your symptoms.  Sinus infections are not as easily transmitted as other respiratory infection, however we still recommend that you avoid close contact with loved ones, especially the very young and elderly.  Remember to wash your hands thoroughly throughout the day as this is the number one way to prevent the spread of infection!  Home Care: Only take medications as instructed by your medical team. Do not take these medications with alcohol. A steam or ultrasonic humidifier can help congestion.  You can place a towel over your head and breathe in the steam from hot water coming from a faucet. Avoid close contacts especially the very young and the elderly. Cover your mouth when you cough or sneeze. Always remember to wash your hands.  Get Help Right Away If: You develop worsening fever or sinus pain. You develop a severe head ache or visual changes. Your symptoms persist after you have completed your treatment plan.  Make sure you Understand these instructions. Will watch your condition. Will get help right away if you  are not doing well or get worse.   Thank you for choosing an e-visit.  Your e-visit answers were reviewed by a board certified advanced clinical practitioner to complete your personal care plan. Depending upon the condition, your plan could have included both over the counter or prescription medications.  Please review your pharmacy choice. Make sure the pharmacy is open so you can pick up prescription now. If there is a problem, you may contact your provider through MyChart messaging and have the prescription routed to another pharmacy.  Your safety is important to us. If you have drug allergies check your prescription carefully.   For the next 24 hours you can use MyChart to ask questions about today's visit, request a non-urgent call back, or ask for a work or school excuse. You will get an email in the next two days asking about your experience. I hope that your e-visit has been valuable and will speed your recovery.  I have spent 5 minutes in review of e-visit questionnaire, review and updating patient chart, medical decision making and response to patient.   Matis Monnier M Daysia Vandenboom, PA-C  

## 2023-06-22 ENCOUNTER — Other Ambulatory Visit: Payer: Self-pay | Admitting: Gastroenterology

## 2023-06-22 DIAGNOSIS — J09X2 Influenza due to identified novel influenza A virus with other respiratory manifestations: Secondary | ICD-10-CM | POA: Diagnosis not present

## 2023-06-22 DIAGNOSIS — Z1152 Encounter for screening for COVID-19: Secondary | ICD-10-CM | POA: Diagnosis not present

## 2023-06-22 DIAGNOSIS — R509 Fever, unspecified: Secondary | ICD-10-CM | POA: Diagnosis not present

## 2023-06-22 DIAGNOSIS — J09X9 Influenza due to identified novel influenza A virus with other manifestations: Secondary | ICD-10-CM | POA: Diagnosis not present

## 2023-06-22 DIAGNOSIS — R5381 Other malaise: Secondary | ICD-10-CM | POA: Diagnosis not present

## 2023-06-22 MED ORDER — AMOXICILLIN-POT CLAVULANATE 875-125 MG PO TABS: 1.0000 | ORAL_TABLET | Freq: Two times a day (BID) | ORAL | 0 refills | Status: AC

## 2023-07-08 ENCOUNTER — Encounter: Payer: Self-pay | Admitting: Internal Medicine

## 2023-07-08 NOTE — Patient Instructions (Addendum)
Blood work was ordered.       Medications changes include :   wegovy     Return in about 6 months (around 01/06/2024) for follow up.   Health Maintenance, Female Adopting a healthy lifestyle and getting preventive care are important in promoting health and wellness. Ask your health care provider about: The right schedule for you to have regular tests and exams. Things you can do on your own to prevent diseases and keep yourself healthy. What should I know about diet, weight, and exercise? Eat a healthy diet  Eat a diet that includes plenty of vegetables, fruits, low-fat dairy products, and lean protein. Do not eat a lot of foods that are high in solid fats, added sugars, or sodium. Maintain a healthy weight Body mass index (BMI) is used to identify weight problems. It estimates body fat based on height and weight. Your health care provider can help determine your BMI and help you achieve or maintain a healthy weight. Get regular exercise Get regular exercise. This is one of the most important things you can do for your health. Most adults should: Exercise for at least 150 minutes each week. The exercise should increase your heart rate and make you sweat (moderate-intensity exercise). Do strengthening exercises at least twice a week. This is in addition to the moderate-intensity exercise. Spend less time sitting. Even light physical activity can be beneficial. Watch cholesterol and blood lipids Have your blood tested for lipids and cholesterol at 37 years of age, then have this test every 5 years. Have your cholesterol levels checked more often if: Your lipid or cholesterol levels are high. You are older than 37 years of age. You are at high risk for heart disease. What should I know about cancer screening? Depending on your health history and family history, you may need to have cancer screening at various ages. This may include screening for: Breast cancer. Cervical  cancer. Colorectal cancer. Skin cancer. Lung cancer. What should I know about heart disease, diabetes, and high blood pressure? Blood pressure and heart disease High blood pressure causes heart disease and increases the risk of stroke. This is more likely to develop in people who have high blood pressure readings or are overweight. Have your blood pressure checked: Every 3-5 years if you are 50-32 years of age. Every year if you are 53 years old or older. Diabetes Have regular diabetes screenings. This checks your fasting blood sugar level. Have the screening done: Once every three years after age 56 if you are at a normal weight and have a low risk for diabetes. More often and at a younger age if you are overweight or have a high risk for diabetes. What should I know about preventing infection? Hepatitis B If you have a higher risk for hepatitis B, you should be screened for this virus. Talk with your health care provider to find out if you are at risk for hepatitis B infection. Hepatitis C Testing is recommended for: Everyone born from [birth year] through 1965. Anyone with known risk factors for hepatitis C. Sexually transmitted infections (STIs) Get screened for STIs, including gonorrhea and chlamydia, if: You are sexually active and are younger than 37 years of age. You are older than 37 years of age and your health care provider tells you that you are at risk for this type of infection. Your sexual activity has changed since you were last screened, and you are at increased risk for chlamydia or gonorrhea.  Ask your health care provider if you are at risk. Ask your health care provider about whether you are at high risk for HIV. Your health care provider may recommend a prescription medicine to help prevent HIV infection. If you choose to take medicine to prevent HIV, you should first get tested for HIV. You should then be tested every 3 months for as long as you are taking the  medicine. Pregnancy If you are about to stop having your period (premenopausal) and you may become pregnant, seek counseling before you get pregnant. Take 400 to 800 micrograms (mcg) of folic acid every day if you become pregnant. Ask for birth control (contraception) if you want to prevent pregnancy. Osteoporosis and menopause Osteoporosis is a disease in which the bones lose minerals and strength with aging. This can result in bone fractures. If you are 71 years old or older, or if you are at risk for osteoporosis and fractures, ask your health care provider if you should: Be screened for bone loss. Take a calcium or vitamin D supplement to lower your risk of fractures. Be given hormone replacement therapy (HRT) to treat symptoms of menopause. Follow these instructions at home: Alcohol use Do not drink alcohol if: Your health care provider tells you not to drink. You are pregnant, may be pregnant, or are planning to become pregnant. If you drink alcohol: Limit how much you have to: 0-1 drink a day. Know how much alcohol is in your drink. In the [place] one drink equals one 12 oz bottle of beer (355 mL), one 5 oz glass of wine (148 mL), or one 1 oz glass of hard liquor (44 mL). Lifestyle Do not use any products that contain nicotine or tobacco. These products include cigarettes, chewing tobacco, and vaping devices, such as e-cigarettes. If you need help quitting, ask your health care provider. Do not use street drugs. Do not share needles. Ask your health care provider for help if you need support or information about quitting drugs. General instructions Schedule regular health, dental, and eye exams. Stay current with your vaccines. Tell your health care provider if: You often feel depressed. You have ever been abused or do not feel safe at home. Summary Adopting a healthy lifestyle and getting preventive care are important in promoting health and wellness. Follow your health care  provider's instructions about healthy diet, exercising, and getting tested or screened for diseases. Follow your health care provider's instructions on monitoring your cholesterol and blood pressure. This information is not intended to replace advice given to you by your health care provider. Make sure you discuss any questions you have with your health care provider. Document Revised: 10/28/2020 Document Reviewed: 10/28/2020 Elsevier Patient Education  2024 [employer].

## 2023-07-08 NOTE — Progress Notes (Signed)
Subjective:    Patient ID: Cheryl Hampton, female    DOB: 1986/04/07, 38 y.o.   MRN: [id number]      HPI [name] is here for a Physical exam and her chronic medical problems.   Overall stress and anxiety/depression are well-controlled.  She feels like she is tolerating stress in her life very well.  Not currently taking any weight loss medication.  She is interested in [place].  She tolerated that medication well and there was more effective than other close she has tried   Medications and allergies reviewed with patient and updated if appropriate.  Current Outpatient Medications on File Prior to Visit  Medication Sig Dispense Refill   butalbital-acetaminophen-caffeine (FIORICET) 50-325-40 MG tablet Take 1 tablet by mouth every 4 (four) hours as needed for headache. 45 tablet 0   fluticasone (FLONASE) 50 MCG/ACT nasal spray Place 2 sprays into both nostrils daily. 16 g 0   levonorgestrel (MIRENA, 52 MG,) 20 MCG/DAY IUD Mirena 21 mcg/24 hours (8 yrs) 52 mg intrauterine device  Take 1 device every day by intrauterine route.     levothyroxine (SYNTHROID) 100 MCG tablet Take 1 tablet (100 mcg total) by mouth daily. 90 tablet 3   losartan-hydrochlorothiazide (HYZAAR) 50-12.5 MG tablet Take 1 tablet by mouth daily. 90 tablet 0   venlafaxine XR (EFFEXOR-XR) 75 MG 24 hr capsule Take 1 capsule (75 mg total) by mouth daily with breakfast. 90 capsule 1   No current facility-administered medications on file prior to visit.    Review of Systems  Constitutional:  Negative for fever.  Eyes:  Negative for visual disturbance.  Respiratory:  Negative for cough, shortness of breath and wheezing.   Cardiovascular:  Negative for chest pain, palpitations and leg swelling.  Gastrointestinal:  Negative for abdominal pain, blood in stool, constipation and diarrhea.       Occ gerd  Genitourinary:  Negative for dysuria.  Musculoskeletal:  Negative for arthralgias and back pain.  Skin:   Negative for rash.  Neurological:  Positive for headaches. Negative for light-headedness.  Psychiatric/Behavioral:  Positive for dysphoric mood. The patient is nervous/anxious.        Objective:   Vitals:   07/09/23 1259  BP: 116/78  Pulse: 74  Temp: 98.4 F (36.9 C)  SpO2: 100%   Filed Weights   07/09/23 1259  Weight: 232 lb (105.2 kg)   Body mass index is 39.82 kg/m.  BP Readings from Last 3 Encounters:  07/09/23 116/78  05/27/23 133/76  11/20/22 122/82    Wt Readings from Last 3 Encounters:  07/09/23 232 lb (105.2 kg)  05/27/23 231 lb 9.6 oz (105.1 kg)  11/20/22 227 lb (103 kg)       Physical Exam Constitutional: She appears well-developed and well-nourished. No distress.  HENT:  Head: Normocephalic and atraumatic.  Right Ear: External ear normal. Normal ear canal and TM Left Ear: External ear normal.  Normal ear canal and TM Mouth/Throat: Oropharynx is clear and moist.  Eyes: Conjunctivae normal.  Neck: Neck supple. No tracheal deviation present. No thyromegaly present.  No carotid bruit  Cardiovascular: Normal rate, regular rhythm and normal heart sounds.   No murmur heard.  No edema. Pulmonary/Chest: Effort normal and breath sounds normal. No respiratory distress. She has no wheezes. She has no rales.  Breast: deferred   Abdominal: Soft. She exhibits no distension. There is no tenderness.  Lymphadenopathy: She has no cervical adenopathy.  Skin: Skin is warm and dry. She is  not diaphoretic.  Psychiatric: She has a normal mood and affect. Her behavior is normal.     Lab Results  Component Value Date   WBC 7.5 01/23/2022   HGB 14.2 01/23/2022   HCT 41.9 01/23/2022   PLT 447.0 (H) 01/23/2022   GLUCOSE 109 (H) 01/23/2022   CHOL 256 (H) 01/23/2022   TRIG (H) 01/23/2022    408.0 Triglyceride is over 400; calculations on Lipids are invalid.   HDL 46.20 01/23/2022   LDLDIRECT 173.0 01/23/2022   LDLCALC 86 08/06/2017   ALT 37 (H) 01/23/2022   AST 30  01/23/2022   NA 136 01/23/2022   K 3.5 01/23/2022   CL 99 01/23/2022   CREATININE 0.70 01/23/2022   BUN 10 01/23/2022   CO2 31 01/23/2022   TSH 1.82 01/23/2022   INR 0.9 02/15/2020   HGBA1C 5.7 01/23/2022         Assessment & Plan:   Physical exam: Screening blood work  ordered Exercise  exercises 1-2 times a week, active at work Weight  - obese - working on weight loss Substance abuse  none   Reviewed recommended immunizations.   Health Maintenance  Topic Date Due   Cervical Cancer Screening (HPV/Pap Cotest)  04/06/2019   COVID-19 Vaccine (3 - Moderna risk series) 07/25/2023 (Originally 09/12/2019)   DTaP/Tdap/Td (3 - Td or Tdap) 07/02/2025   INFLUENZA VACCINE  Completed   Hepatitis C Screening  Completed   HIV Screening  Completed   HPV VACCINES  Aged Out          See Problem List for Assessment and Plan of chronic medical problems.

## 2023-07-09 ENCOUNTER — Other Ambulatory Visit (HOSPITAL_COMMUNITY): Payer: Self-pay

## 2023-07-09 ENCOUNTER — Ambulatory Visit (INDEPENDENT_AMBULATORY_CARE_PROVIDER_SITE_OTHER): Payer: 59 | Admitting: Internal Medicine

## 2023-07-09 VITALS — BP 116/78 | HR 74 | Temp 98.4°F | Ht 64.0 in | Wt 232.0 lb

## 2023-07-09 DIAGNOSIS — Z0000 Encounter for general adult medical examination without abnormal findings: Secondary | ICD-10-CM | POA: Diagnosis not present

## 2023-07-09 DIAGNOSIS — F3289 Other specified depressive episodes: Secondary | ICD-10-CM

## 2023-07-09 DIAGNOSIS — D75839 Thrombocytosis, unspecified: Secondary | ICD-10-CM | POA: Diagnosis not present

## 2023-07-09 DIAGNOSIS — I10 Essential (primary) hypertension: Secondary | ICD-10-CM | POA: Diagnosis not present

## 2023-07-09 DIAGNOSIS — R7303 Prediabetes: Secondary | ICD-10-CM | POA: Diagnosis not present

## 2023-07-09 DIAGNOSIS — F419 Anxiety disorder, unspecified: Secondary | ICD-10-CM

## 2023-07-09 DIAGNOSIS — E039 Hypothyroidism, unspecified: Secondary | ICD-10-CM | POA: Diagnosis not present

## 2023-07-09 DIAGNOSIS — G43809 Other migraine, not intractable, without status migrainosus: Secondary | ICD-10-CM

## 2023-07-09 DIAGNOSIS — E782 Mixed hyperlipidemia: Secondary | ICD-10-CM

## 2023-07-09 LAB — COMPREHENSIVE METABOLIC PANEL
ALT: 21 U/L (ref 0–35)
AST: 20 U/L (ref 0–37)
Albumin: 4.1 g/dL (ref 3.5–5.2)
Alkaline Phosphatase: 73 U/L (ref 39–117)
BUN: 8 mg/dL (ref 6–23)
CO2: 30 meq/L (ref 19–32)
Calcium: 9.5 mg/dL (ref 8.4–10.5)
Chloride: 98 meq/L (ref 96–112)
Creatinine, Ser: 0.59 mg/dL (ref 0.40–1.20)
GFR: 116.1 mL/min (ref >60.00)
Glucose, Bld: 80 mg/dL (ref 70–99)
Potassium: 3.8 meq/L (ref 3.5–5.1)
Sodium: 135 meq/L (ref 135–145)
Total Bilirubin: 0.5 mg/dL (ref 0.2–1.2)
Total Protein: 8.2 g/dL (ref 6.0–8.3)

## 2023-07-09 LAB — CBC WITH DIFFERENTIAL/PLATELET
Basophils Absolute: 0.1 10*3/uL (ref 0.0–0.1)
Basophils Relative: 0.6 % (ref 0.0–3.0)
Eosinophils Absolute: 0.1 10*3/uL (ref 0.0–0.7)
Eosinophils Relative: 1 % (ref 0.0–5.0)
HCT: 37.3 % (ref 36.0–46.0)
Hemoglobin: 12.2 g/dL (ref 12.0–15.0)
Lymphocytes Relative: 23.6 % (ref 12.0–46.0)
Lymphs Abs: 2 10*3/uL (ref 0.7–4.0)
MCHC: 32.8 g/dL (ref 30.0–36.0)
MCV: 87.3 fL (ref 78.0–100.0)
Monocytes Absolute: 0.5 10*3/uL (ref 0.1–1.0)
Monocytes Relative: 5.6 % (ref 3.0–12.0)
Neutro Abs: 6 10*3/uL (ref 1.4–7.7)
Neutrophils Relative %: 69.2 % (ref 43.0–77.0)
Platelets: 560 10*3/uL — ABNORMAL HIGH (ref 150.0–400.0)
RBC: 4.28 Mil/uL (ref 3.87–5.11)
RDW: 14.7 % (ref 11.5–15.5)
WBC: 8.6 10*3/uL (ref 4.0–10.5)

## 2023-07-09 LAB — HEMOGLOBIN A1C: Hgb A1c MFr Bld: 5.9 % (ref 4.6–6.5)

## 2023-07-09 LAB — LIPID PANEL
Cholesterol: 180 mg/dL (ref 0–200)
HDL: 42.1 mg/dL (ref >39.00)
LDL Cholesterol: 112 mg/dL — ABNORMAL HIGH (ref 0–99)
NonHDL: 137.43
Total CHOL/HDL Ratio: 4
Triglycerides: 129 mg/dL (ref 0.0–149.0)
VLDL: 25.8 mg/dL (ref 0.0–40.0)

## 2023-07-09 LAB — TSH: TSH: 1.48 u[IU]/mL (ref 0.35–5.50)

## 2023-07-09 MED ORDER — WEGOVY 0.25 MG/0.5ML ~~LOC~~ SOAJ
0.2500 mg | SUBCUTANEOUS | 0 refills | Status: DC
  Filled 2023-07-09 – 2023-07-12 (×2): qty 2, 28d supply, fill #0

## 2023-07-09 NOTE — Assessment & Plan Note (Signed)
Chronic BP well controlled Cmp, cbc Continue hyzaar 50-12.5 mg daily

## 2023-07-09 NOTE — Assessment & Plan Note (Addendum)
Chronic Has been on several medications in the past Most recently was on a combination of phentermine and Topamax She is exercising-not able to do more because of both of her children having sports activities She is trying to eat better She would like to retry [facility] which she did well with in the past Because of her comorbidities of hypertension, prediabetes, hyperlipidemia, anxiety and depression she is a very good candidate for [facility] Stressed continuing regular exercise, smaller portions and healthy diet

## 2023-07-09 NOTE — Assessment & Plan Note (Signed)
Chronic Controlled, Stable Continue venlafaxine 75 mg daily No longer seeing Dr. Dellia Cloud

## 2023-07-09 NOTE — Assessment & Plan Note (Signed)
Chronic Intermittent headaches Continue Fioricet 50-325 mg every 4 hours as needed

## 2023-07-09 NOTE — Assessment & Plan Note (Signed)
Chronic Regular exercise and healthy diet encouraged Check lipid panel  Continue lifestyle control 

## 2023-07-09 NOTE — Assessment & Plan Note (Signed)
Chronic  Clinically euthyroid Check tsh and will titrate med dose if needed Currently taking levothyroxine 100 mcg daily

## 2023-07-09 NOTE — Assessment & Plan Note (Signed)
Chronic Lab Results  Component Value Date   HGBA1C 5.7 01/23/2022   Check a1c Low sugar / carb diet Stressed regular exercise

## 2023-07-10 ENCOUNTER — Other Ambulatory Visit (HOSPITAL_COMMUNITY): Payer: Self-pay

## 2023-07-11 ENCOUNTER — Encounter: Payer: Self-pay | Admitting: Internal Medicine

## 2023-07-11 DIAGNOSIS — D75839 Thrombocytosis, unspecified: Secondary | ICD-10-CM | POA: Insufficient documentation

## 2023-07-11 NOTE — Addendum Note (Signed)
Addended by: Pincus Sanes on: 07/11/2023 10:45 AM   Modules accepted: Orders

## 2023-07-12 ENCOUNTER — Encounter: Payer: Self-pay | Admitting: Internal Medicine

## 2023-07-12 ENCOUNTER — Other Ambulatory Visit (HOSPITAL_COMMUNITY): Payer: Self-pay

## 2023-07-12 ENCOUNTER — Encounter (HOSPITAL_COMMUNITY): Payer: Self-pay

## 2023-07-12 NOTE — Telephone Encounter (Signed)
PA needed for Wegovy.

## 2023-07-13 ENCOUNTER — Telehealth: Payer: Self-pay | Admitting: Pharmacist

## 2023-07-13 NOTE — Telephone Encounter (Signed)
Spoke with patient over the phone, since she is a pre diabetic she is wanting to know if there are alternative medications that can be sent in. Please advise

## 2023-07-13 NOTE — Telephone Encounter (Signed)
The only GLP-1 medications currently covered by [facility] are Ozempic and Biggers, and only for patients diagnosed with Type 2 diabetes. These medications are not covered for weight loss.

## 2023-07-14 ENCOUNTER — Encounter: Payer: Self-pay | Admitting: Internal Medicine

## 2023-07-14 NOTE — Addendum Note (Signed)
Addended by: Pincus Sanes on: 07/14/2023 07:51 AM   Modules accepted: Orders

## 2023-07-14 NOTE — Telephone Encounter (Signed)
MyChart message sent to patient-discussed metformin

## 2023-07-15 ENCOUNTER — Other Ambulatory Visit (HOSPITAL_COMMUNITY): Payer: Self-pay

## 2023-07-15 ENCOUNTER — Inpatient Hospital Stay: Payer: 59

## 2023-07-15 ENCOUNTER — Inpatient Hospital Stay: Payer: 59 | Attending: Hematology | Admitting: Hematology

## 2023-07-15 ENCOUNTER — Encounter: Payer: Self-pay | Admitting: Hematology

## 2023-07-15 VITALS — BP 122/78 | HR 73 | Temp 97.2°F | Resp 18 | Ht 64.0 in | Wt 234.0 lb

## 2023-07-15 DIAGNOSIS — D75839 Thrombocytosis, unspecified: Secondary | ICD-10-CM | POA: Diagnosis not present

## 2023-07-15 LAB — CBC WITH DIFFERENTIAL/PLATELET
Abs Immature Granulocytes: 0.05 10*3/uL (ref 0.00–0.07)
Basophils Absolute: 0.1 10*3/uL (ref 0.0–0.1)
Basophils Relative: 1 %
Eosinophils Absolute: 0.1 10*3/uL (ref 0.0–0.5)
Eosinophils Relative: 1 %
HCT: 37.9 % (ref 36.0–46.0)
Hemoglobin: 11.8 g/dL — ABNORMAL LOW (ref 12.0–15.0)
Immature Granulocytes: 1 %
Lymphocytes Relative: 25 %
Lymphs Abs: 2.1 10*3/uL (ref 0.7–4.0)
MCH: 27.9 pg (ref 26.0–34.0)
MCHC: 31.1 g/dL (ref 30.0–36.0)
MCV: 89.6 fL (ref 80.0–100.0)
Monocytes Absolute: 0.6 10*3/uL (ref 0.1–1.0)
Monocytes Relative: 7 %
Neutro Abs: 5.5 10*3/uL (ref 1.7–7.7)
Neutrophils Relative %: 65 %
Platelets: 494 10*3/uL — ABNORMAL HIGH (ref 150–400)
RBC: 4.23 MIL/uL (ref 3.87–5.11)
RDW: 14.4 % (ref 11.5–15.5)
WBC: 8.4 10*3/uL (ref 4.0–10.5)
nRBC: 0 % (ref 0.0–0.2)

## 2023-07-15 LAB — IRON AND TIBC
Iron: 59 ug/dL (ref 28–170)
Saturation Ratios: 16 % (ref 10.4–31.8)
TIBC: 375 ug/dL (ref 250–450)
UIBC: 316 ug/dL

## 2023-07-15 LAB — FERRITIN: Ferritin: 43 ng/mL (ref 11–307)

## 2023-07-15 LAB — C-REACTIVE PROTEIN: CRP: 4.7 mg/dL — ABNORMAL HIGH (ref <1.0)

## 2023-07-15 LAB — SEDIMENTATION RATE: Sed Rate: 51 mm/h — ABNORMAL HIGH (ref 0–22)

## 2023-07-15 LAB — LACTATE DEHYDROGENASE: LDH: 115 U/L (ref 98–192)

## 2023-07-15 NOTE — Patient Instructions (Signed)
You were seen and examined today by Dr. Ellin Saba. Dr. Ellin Saba is a hematologist, meaning that he specializes in blood abnormalities. Dr. Ellin Saba discussed your past medical history, family history of cancers/blood conditions and the events that led to you being here today.  You were referred to Dr. Ellin Saba due to thrombocytosis (elevated platelet count).  Dr. Ellin Saba has recommended additional labs today for further evaluation.  Follow-up as scheduled.

## 2023-07-15 NOTE — Progress Notes (Signed)
[facility] [address] [place] [address]   Clinic Day:  07/20/2023  Referring physician: Pincus Sanes, MD  Patient Care Team: Pincus Sanes, MD as PCP - General (Internal Medicine) Altheimer, Casimiro Needle, MD as Consulting Physician (Endocrinology) Sherian Rein, MD (Obstetrics and Gynecology) Gelene Mink, NP (Gastroenterology)   ASSESSMENT & PLAN:   Assessment:  1.  Thrombocytosis: - Seen at the request of Dr. Lawerance Bach. - Elevated platelet count since August 2021.  WBC and Hb in the normal range. - Has history of migraines.  No aqua genic pruritus/erythromelalgia's/thrombosis.  2. 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 No tobacco use.  -No family history of thrombocytosis. Maternal uncle had a DVT and PE. Maternal grandmother had metastatic ovarian cancer. Maternal aunt had cervical cancer. Maternal uncle had pancreatic cancer.   Plan:  1.  Thrombocytosis: - We discussed clonal as well as reactive causes of thrombocytosis. - Recommend repeating platelet count and check for inflammatory markers, connective tissue disorders.  Will also check for ferritin and iron panel. - Will check for myeloproliferative neoplasms with JAK2 V6 54F with reflex testing and BCR/ABL by FISH. - RTC 4 weeks for follow-up to discuss results and further plan.   Orders Placed This Encounter  Procedures   Lactate dehydrogenase    Standing Status:   Future    Number of Occurrences:   1    Expected Date:   07/15/2023    Expiration Date:   07/14/2024   Rheumatoid factor    Standing Status:   Future    Number of Occurrences:   1    Expected Date:   07/15/2023    Expiration Date:   07/14/2024   C-reactive protein    Standing Status:   Future    Number of Occurrences:   1    Expected Date:   07/15/2023    Expiration Date:   07/14/2024   ANA, IFA (with reflex)    Standing Status:   Future    Number of Occurrences:   1     Expected Date:   07/15/2023    Expiration Date:   07/14/2024   CBC with Differential/Platelet    Standing Status:   Future    Number of Occurrences:   1    Expected Date:   07/15/2023    Expiration Date:   07/14/2024   BCR-ABL1 FISH    Standing Status:   Future    Number of Occurrences:   1    Expected Date:   07/15/2023    Expiration Date:   07/14/2024   Sedimentation rate    Standing Status:   Future    Number of Occurrences:   1    Expected Date:   07/15/2023    Expiration Date:   07/14/2024   JAK2 V654F rfx CALR/MPL/E12-15    Standing Status:   Future    Number of Occurrences:   1    Expected Date:   07/15/2023    Expiration Date:   07/14/2024   Iron and TIBC ([facility] ONLY)    Standing Status:   Future    Number of Occurrences:   1    Expected Date:   07/15/2023    Expiration Date:   07/14/2024   Ferritin    Standing Status:   Future    Number of Occurrences:   1    Expected Date:   07/15/2023  Expiration Date:   07/14/2024      Alben Deeds Teague,acting as a scribe for Doreatha Massed, MD.,have documented all relevant documentation on the behalf of Doreatha Massed, MD,as directed by  Doreatha Massed, MD while in the presence of Doreatha Massed, MD.   I, Doreatha Massed MD, have reviewed the above documentation for accuracy and completeness, and I agree with the above.   Doreatha Massed, MD   1/28/20256:40 PM  CHIEF COMPLAINT/PURPOSE OF CONSULT:   Diagnosis: Thrombocytosis  Current Therapy:  none  HISTORY OF PRESENT ILLNESS:   Leeyah is a 38 y.o. female presenting to clinic today for evaluation of thrombocytosis at the request of Burns, Bobette Mo, MD.  Patient was seen by her PCP on 07/09/23 and lab work found platelets elevated at 560. Her platelets have been elevated for the past 3 years, with her last platelets higher than the previous years.   Today, she states that she is doing well overall. Her appetite level is at 100%.  Her energy level is at 40-50%. She takes Fioricet for migraines, which she does not have often. She notes occasional ankle swellings. She denies any aquagenic pruritus, erythromelalgias, lupus, rheumatoid arthritis, joint swellings, or skin rashes. She denies any history of DVT's or PE's.    PAST MEDICAL HISTORY:   Past Medical History: Past Medical History:  Diagnosis Date   Depression    Post partum took lexapro and wellbutrin and it helped   Hypothyroid 2012 dx   post partum hyperthyroid   Migraine headache     Surgical History: Past Surgical History:  Procedure Laterality Date   CHOLECYSTECTOMY N/A 07/18/2019   Procedure: LAPAROSCOPIC CHOLECYSTECTOMY;  Surgeon: Franky Macho, MD;  Location: [facility];  Service: General;  Laterality: N/A;   MOUTH SURGERY     Removal (R) Fallopian tube  09/2009    Social History: Social History   Socioeconomic History   Marital status: Married    Spouse name: Not on file   Number of children: Not on file   Years of education: Not on file   Highest education level: Associate degree: academic program  Occupational History   Not on file  Tobacco Use   Smoking status: Never   Smokeless tobacco: Never  Substance and Sexual Activity   Alcohol use: No   Drug use: No   Sexual activity: Yes    Partners: Male    Comment: spouse  Other Topics Concern   Not on file  Social History Narrative   Not on file   Social Drivers of Health   Financial Resource Strain: Low Risk  (07/06/2023)   Overall Financial Resource Strain (CARDIA)    Difficulty of Paying Living Expenses: Not hard at all  Food Insecurity: No Food Insecurity (07/06/2023)   Hunger Vital Sign    Worried About Running Out of Food in the Last Year: Never true    Ran Out of Food in the Last Year: Never true  Transportation Needs: No Transportation Needs (07/06/2023)   PRAPARE - Administrator, Civil Service (Medical): No    Lack of Transportation (Non-Medical): No  Physical  Activity: Sufficiently Active (07/06/2023)   Exercise Vital Sign    Days of Exercise per Week: 4 days    Minutes of Exercise per Session: 60 min  Stress: No Stress Concern Present (07/06/2023)   [employer] of Occupational Health - Occupational Stress Questionnaire    Feeling of Stress : Only a little  Social Connections: Moderately Integrated (07/06/2023)  Social Connection and Isolation Panel [NHANES]    Frequency of Communication with Friends and Family: More than three times a week    Frequency of Social Gatherings with Friends and Family: Three times a week    Attends Religious Services: 1 to 4 times per year    Active Member of Clubs or Organizations: No    Attends Banker Meetings: Not on file    Marital Status: Married  Intimate Partner Violence: Not At Risk (07/15/2023)   Humiliation, Afraid, Rape, and Kick questionnaire    Fear of Current or Ex-Partner: No    Emotionally Abused: No    Physically Abused: No    Sexually Abused: No    Family History: Family History  Problem Relation Age of Onset   Alcohol abuse Maternal Uncle    Pancreatic cancer Maternal Grandmother    Stroke Maternal Grandfather    Hypertension Mother    Diabetes Mother    Hyperlipidemia Mother    Thyroid disease Neg Hx     Current Medications:  Current Outpatient Medications:    butalbital-acetaminophen-caffeine (FIORICET) 50-325-40 MG tablet, Take 1 tablet by mouth every 4 (four) hours as needed for headache., Disp: 45 tablet, Rfl: 0   levonorgestrel (MIRENA, 52 MG,) 20 MCG/DAY IUD, Mirena 21 mcg/24 hours (8 yrs) 52 mg intrauterine device  Take 1 device every day by intrauterine route., Disp: , Rfl:    levothyroxine (SYNTHROID) 100 MCG tablet, Take 1 tablet (100 mcg total) by mouth daily., Disp: 90 tablet, Rfl: 3   losartan-hydrochlorothiazide (HYZAAR) 50-12.5 MG tablet, Take 1 tablet by mouth daily., Disp: 90 tablet, Rfl: 0   venlafaxine XR (EFFEXOR-XR) 75 MG 24 hr capsule, Take 1  capsule (75 mg total) by mouth daily with breakfast., Disp: 90 capsule, Rfl: 1   amoxicillin-clavulanate (AUGMENTIN) 875-125 MG tablet, Take 1 tablet by mouth 2 (two) times daily., Disp: 14 tablet, Rfl: 0   Allergies: Allergies  Allergen Reactions   Gabapentin     Made her feel funny    REVIEW OF SYSTEMS:   Review of Systems  Constitutional:  Negative for chills, fatigue and fever.  HENT:   Negative for lump/mass, mouth sores, nosebleeds, sore throat and trouble swallowing.   Eyes:  Negative for eye problems.  Respiratory:  Negative for cough and shortness of breath.   Cardiovascular:  Negative for chest pain, leg swelling and palpitations.  Gastrointestinal:  Negative for abdominal pain, constipation, diarrhea, nausea and vomiting.  Genitourinary:  Negative for bladder incontinence, difficulty urinating, dysuria, frequency, hematuria and nocturia.   Musculoskeletal:  Negative for arthralgias, back pain, flank pain, myalgias and neck pain.  Skin:  Negative for itching and rash.  Neurological:  Negative for dizziness, headaches and numbness.  Hematological:  Does not bruise/bleed easily.  Psychiatric/Behavioral:  Positive for depression. Negative for sleep disturbance and suicidal ideas. The patient is not nervous/anxious.   All other systems reviewed and are negative.    VITALS:   Blood pressure 122/78, pulse 73, temperature (!) 97.2 F (36.2 C), temperature source Tympanic, resp. rate 18, height 5\' 4"  (1.626 m), weight 234 lb (106.1 kg), SpO2 100%, unknown if currently breastfeeding.  Wt Readings from Last 3 Encounters:  07/15/23 234 lb (106.1 kg)  07/09/23 232 lb (105.2 kg)  05/27/23 231 lb 9.6 oz (105.1 kg)    Body mass index is 40.17 kg/m.   PHYSICAL EXAM:   Physical Exam Vitals and nursing note reviewed. Exam conducted with a chaperone present.  Constitutional:  Appearance: Normal appearance.  Cardiovascular:     Rate and Rhythm: Normal rate and regular  rhythm.     Pulses: Normal pulses.     Heart sounds: Normal heart sounds.  Pulmonary:     Effort: Pulmonary effort is normal.     Breath sounds: Normal breath sounds.  Abdominal:     Palpations: Abdomen is soft. There is no hepatomegaly, splenomegaly or mass.     Tenderness: There is no abdominal tenderness.  Musculoskeletal:     Right lower leg: No edema.     Left lower leg: No edema.  Lymphadenopathy:     Cervical: No cervical adenopathy.     Right cervical: No superficial, deep or posterior cervical adenopathy.    Left cervical: No superficial, deep or posterior cervical adenopathy.     Upper Body:     Right upper body: No supraclavicular or axillary adenopathy.     Left upper body: No supraclavicular or axillary adenopathy.  Neurological:     General: No focal deficit present.     Mental Status: She is alert and oriented to person, place, and time.  Psychiatric:        Mood and Affect: Mood normal.        Behavior: Behavior normal.     LABS:   CBC    Component Value Date/Time   WBC 8.4 07/15/2023 0835   RBC 4.23 07/15/2023 0835   HGB 11.8 (L) 07/15/2023 0835   HCT 37.9 07/15/2023 0835   PLT 494 (H) 07/15/2023 0835   MCV 89.6 07/15/2023 0835   MCH 27.9 07/15/2023 0835   MCHC 31.1 07/15/2023 0835   RDW 14.4 07/15/2023 0835   LYMPHSABS 2.1 07/15/2023 0835   MONOABS 0.6 07/15/2023 0835   EOSABS 0.1 07/15/2023 0835   BASOSABS 0.1 07/15/2023 0835    CMP    Component Value Date/Time   NA 135 07/09/2023 1328   K 3.8 07/09/2023 1328   CL 98 07/09/2023 1328   CO2 30 07/09/2023 1328   GLUCOSE 80 07/09/2023 1328   BUN 8 07/09/2023 1328   CREATININE 0.59 07/09/2023 1328   CALCIUM 9.5 07/09/2023 1328   PROT 8.2 07/09/2023 1328   ALBUMIN 4.1 07/09/2023 1328   AST 20 07/09/2023 1328   ALT 21 07/09/2023 1328   ALKPHOS 73 07/09/2023 1328   BILITOT 0.5 07/09/2023 1328   GFRNONAA >60 02/15/2020 0850   GFRAA >60 02/15/2020 0850    No results found for: "CEA1",  "CEA" / No results found for: "CEA1", "CEA" No results found for: "PSA1" No results found for: "ZOX096" No results found for: "CAN125"  No results found for: "TOTALPROTELP", "ALBUMINELP", "A1GS", "A2GS", "BETS", "BETA2SER", "GAMS", "MSPIKE", "SPEI" Lab Results  Component Value Date   TIBC 375 07/15/2023   FERRITIN 43 07/15/2023   FERRITIN 21.6 03/21/2018   IRONPCTSAT 16 07/15/2023   Lab Results  Component Value Date   LDH 115 07/15/2023   LDH 207 (H) 09/23/2015   LDH 170 11/16/2010     STUDIES:   No results found.

## 2023-07-16 LAB — RHEUMATOID FACTOR: Rheumatoid fact SerPl-aCnc: 10.7 [IU]/mL (ref <14.0)

## 2023-07-17 LAB — ANTINUCLEAR ANTIBODIES, IFA: ANA Ab, IFA: NEGATIVE

## 2023-07-20 ENCOUNTER — Telehealth: Payer: 59 | Admitting: Physician Assistant

## 2023-07-20 DIAGNOSIS — B9689 Other specified bacterial agents as the cause of diseases classified elsewhere: Secondary | ICD-10-CM | POA: Diagnosis not present

## 2023-07-20 DIAGNOSIS — J019 Acute sinusitis, unspecified: Secondary | ICD-10-CM

## 2023-07-20 LAB — BCR-ABL1 FISH
Cells Analyzed: 200
Cells Counted: 200

## 2023-07-20 MED ORDER — AMOXICILLIN-POT CLAVULANATE 875-125 MG PO TABS: 1.0000 | ORAL_TABLET | Freq: Two times a day (BID) | ORAL | 0 refills | Status: DC

## 2023-07-20 NOTE — Progress Notes (Signed)
I have spent 5 minutes in review of e-visit questionnaire, review and updating patient chart, medical decision making and response to patient.   Piedad Climes, PA-C

## 2023-07-20 NOTE — Progress Notes (Signed)
Message sent to patient requesting further input regarding current symptoms. Awaiting patient response.

## 2023-07-20 NOTE — Progress Notes (Signed)

## 2023-07-21 LAB — JAK2 V617F RFX CALR/MPL/E12-15

## 2023-07-21 LAB — CALR +MPL + E12-E15  (REFLEX)

## 2023-07-26 ENCOUNTER — Other Ambulatory Visit: Payer: Self-pay | Admitting: Internal Medicine

## 2023-07-26 ENCOUNTER — Other Ambulatory Visit (HOSPITAL_COMMUNITY): Payer: Self-pay

## 2023-07-26 MED ORDER — VENLAFAXINE HCL ER 75 MG PO CP24
75.0000 mg | ORAL_CAPSULE | Freq: Every day | ORAL | 1 refills | Status: DC
  Filled 2023-07-26: qty 90, 90d supply, fill #0
  Filled 2023-11-01: qty 90, 90d supply, fill #1

## 2023-08-16 ENCOUNTER — Other Ambulatory Visit (HOSPITAL_COMMUNITY): Payer: Self-pay

## 2023-08-16 ENCOUNTER — Other Ambulatory Visit: Payer: Self-pay | Admitting: Internal Medicine

## 2023-08-16 ENCOUNTER — Other Ambulatory Visit: Payer: Self-pay

## 2023-08-16 MED ORDER — LOSARTAN POTASSIUM-HCTZ 50-12.5 MG PO TABS
1.0000 | ORAL_TABLET | Freq: Every day | ORAL | 0 refills | Status: DC
  Filled 2023-08-16: qty 90, 90d supply, fill #0

## 2023-08-18 NOTE — Progress Notes (Incomplete)
 [facility] [address] [place] [address]   Clinic Day:  08/18/2023  Referring physician: Pincus Sanes, MD  Patient Care Team: Pincus Sanes, MD as PCP - General (Internal Medicine) Altheimer, Casimiro Needle, MD as Consulting Physician (Endocrinology) Sherian Rein, MD (Obstetrics and Gynecology) Gelene Mink, NP (Gastroenterology)   ASSESSMENT & PLAN:   Assessment:  1.  Thrombocytosis: - Seen at the request of Dr. Lawerance Bach. - Elevated platelet count since August 2021.  WBC and Hb in the normal range. - Has history of migraines.  No aqua genic pruritus/erythromelalgia's/thrombosis.  2. 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 No tobacco use.  -No family history of thrombocytosis. Maternal uncle had a DVT and PE. Maternal grandmother had metastatic ovarian cancer. Maternal aunt had cervical cancer. Maternal uncle had pancreatic cancer.   Plan:  1.  Thrombocytosis: - We discussed clonal as well as reactive causes of thrombocytosis. - Recommend repeating platelet count and check for inflammatory markers, connective tissue disorders.  Will also check for ferritin and iron panel. - Will check for myeloproliferative neoplasms with JAK2 V6 47F with reflex testing and BCR/ABL by FISH. - RTC 4 weeks for follow-up to discuss results and further plan.   No orders of the defined types were placed in this encounter.     Alben Deeds Teague,acting as a Neurosurgeon for Cheryl Massed, MD.,have documented all relevant documentation on the behalf of Cheryl Massed, MD,as directed by  Cheryl Massed, MD while in the presence of Cheryl Massed, MD.  ***   Gully R Teague   2/26/20258:59 AM  CHIEF COMPLAINT/PURPOSE OF CONSULT:   Diagnosis: Thrombocytosis  Current Therapy:  none  HISTORY OF PRESENT ILLNESS:   Cheryl Hampton is a 38 y.o. female presenting to clinic today for evaluation of thrombocytosis at the  request of Burns, Bobette Mo, MD.  Patient was seen by her PCP on 07/09/23 and lab work found platelets elevated at 560. Her platelets have been elevated for the past 3 years, with her last platelets higher than the previous years.   Today, she states that she is doing well overall. Her appetite level is at 100%. Her energy level is at 40-50%. She takes Fioricet for migraines, which she does not have often. She notes occasional ankle swellings. She denies any aquagenic pruritus, erythromelalgias, lupus, rheumatoid arthritis, joint swellings, or skin rashes. She denies any history of DVT's or PE's.    INTERVAL HISTORY:   Cheryl Hampton is a 38 y.o. female presenting to the clinic today for follow-up of thrombocytosis. She was last seen by me on 07/15/23 in consultation.  Today, she states that she is doing well overall. Her appetite level is at ***%. Her energy level is at ***%.   PAST MEDICAL HISTORY:   Past Medical History: Past Medical History:  Diagnosis Date   Depression    Post partum took lexapro and wellbutrin and it helped   Hypothyroid 2012 dx   post partum hyperthyroid   Migraine headache     Surgical History: Past Surgical History:  Procedure Laterality Date   CHOLECYSTECTOMY N/A 07/18/2019   Procedure: LAPAROSCOPIC CHOLECYSTECTOMY;  Surgeon: Franky Macho, MD;  Location: [facility];  Service: General;  Laterality: N/A;   MOUTH SURGERY     Removal (R) Fallopian tube  09/2009    Social History: Social History   Socioeconomic History   Marital status: Married    Spouse name: Not on file  Number of children: Not on file   Years of education: Not on file   Highest education level: Associate degree: academic program  Occupational History   Not on file  Tobacco Use   Smoking status: Never   Smokeless tobacco: Never  Substance and Sexual Activity   Alcohol use: No   Drug use: No   Sexual activity: Yes    Partners: Male    Comment: spouse  Other Topics Concern    Not on file  Social History Narrative   Not on file   Social Drivers of Health   Financial Resource Strain: Low Risk  (07/06/2023)   Overall Financial Resource Strain (CARDIA)    Difficulty of Paying Living Expenses: Not hard at all  Food Insecurity: No Food Insecurity (07/06/2023)   Hunger Vital Sign    Worried About Running Out of Food in the Last Year: Never true    Ran Out of Food in the Last Year: Never true  Transportation Needs: No Transportation Needs (07/06/2023)   PRAPARE - Administrator, Civil Service (Medical): No    Lack of Transportation (Non-Medical): No  Physical Activity: Sufficiently Active (07/06/2023)   Exercise Vital Sign    Days of Exercise per Week: 4 days    Minutes of Exercise per Session: 60 min  Stress: No Stress Concern Present (07/06/2023)   [employer] of Occupational Health - Occupational Stress Questionnaire    Feeling of Stress : Only a little  Social Connections: Moderately Integrated (07/06/2023)   Social Connection and Isolation Panel [NHANES]    Frequency of Communication with Friends and Family: More than three times a week    Frequency of Social Gatherings with Friends and Family: Three times a week    Attends Religious Services: 1 to 4 times per year    Active Member of Clubs or Organizations: No    Attends Banker Meetings: Not on file    Marital Status: Married  Catering manager Violence: Not At Risk (07/15/2023)   Humiliation, Afraid, Rape, and Kick questionnaire    Fear of Current or Ex-Partner: No    Emotionally Abused: No    Physically Abused: No    Sexually Abused: No    Family History: Family History  Problem Relation Age of Onset   Alcohol abuse Maternal Uncle    Pancreatic cancer Maternal Grandmother    Stroke Maternal Grandfather    Hypertension Mother    Diabetes Mother    Hyperlipidemia Mother    Thyroid disease Neg Hx     Current Medications:  Current Outpatient Medications:     amoxicillin-clavulanate (AUGMENTIN) 875-125 MG tablet, Take 1 tablet by mouth 2 (two) times daily., Disp: 14 tablet, Rfl: 0   butalbital-acetaminophen-caffeine (FIORICET) 50-325-40 MG tablet, Take 1 tablet by mouth every 4 (four) hours as needed for headache., Disp: 45 tablet, Rfl: 0   levonorgestrel (MIRENA, 52 MG,) 20 MCG/DAY IUD, Mirena 21 mcg/24 hours (8 yrs) 52 mg intrauterine device  Take 1 device every day by intrauterine route., Disp: , Rfl:    levothyroxine (SYNTHROID) 100 MCG tablet, Take 1 tablet (100 mcg total) by mouth daily., Disp: 90 tablet, Rfl: 3   losartan-hydrochlorothiazide (HYZAAR) 50-12.5 MG tablet, Take 1 tablet by mouth daily., Disp: 90 tablet, Rfl: 0   venlafaxine XR (EFFEXOR-XR) 75 MG 24 hr capsule, Take 1 capsule (75 mg total) by mouth daily with breakfast., Disp: 90 capsule, Rfl: 1   Allergies: Allergies  Allergen Reactions   Gabapentin  Made her feel funny    REVIEW OF SYSTEMS:   Review of Systems  Constitutional:  Negative for chills, fatigue and fever.  HENT:   Negative for lump/mass, mouth sores, nosebleeds, sore throat and trouble swallowing.   Eyes:  Negative for eye problems.  Respiratory:  Negative for cough and shortness of breath.   Cardiovascular:  Negative for chest pain, leg swelling and palpitations.  Gastrointestinal:  Negative for abdominal pain, constipation, diarrhea, nausea and vomiting.  Genitourinary:  Negative for bladder incontinence, difficulty urinating, dysuria, frequency, hematuria and nocturia.   Musculoskeletal:  Negative for arthralgias, back pain, flank pain, myalgias and neck pain.  Skin:  Negative for itching and rash.  Neurological:  Negative for dizziness, headaches and numbness.  Hematological:  Does not bruise/bleed easily.  Psychiatric/Behavioral:  Negative for depression, sleep disturbance and suicidal ideas. The patient is not nervous/anxious.   All other systems reviewed and are negative.    VITALS:   unknown if  currently breastfeeding.  Wt Readings from Last 3 Encounters:  07/15/23 234 lb (106.1 kg)  07/09/23 232 lb (105.2 kg)  05/27/23 231 lb 9.6 oz (105.1 kg)    There is no height or weight on file to calculate BMI.   PHYSICAL EXAM:   Physical Exam Vitals and nursing note reviewed. Exam conducted with a chaperone present.  Constitutional:      Appearance: Normal appearance.  Cardiovascular:     Rate and Rhythm: Normal rate and regular rhythm.     Pulses: Normal pulses.     Heart sounds: Normal heart sounds.  Pulmonary:     Effort: Pulmonary effort is normal.     Breath sounds: Normal breath sounds.  Abdominal:     Palpations: Abdomen is soft. There is no hepatomegaly, splenomegaly or mass.     Tenderness: There is no abdominal tenderness.  Musculoskeletal:     Right lower leg: No edema.     Left lower leg: No edema.  Lymphadenopathy:     Cervical: No cervical adenopathy.     Right cervical: No superficial, deep or posterior cervical adenopathy.    Left cervical: No superficial, deep or posterior cervical adenopathy.     Upper Body:     Right upper body: No supraclavicular or axillary adenopathy.     Left upper body: No supraclavicular or axillary adenopathy.  Neurological:     General: No focal deficit present.     Mental Status: She is alert and oriented to person, place, and time.  Psychiatric:        Mood and Affect: Mood normal.        Behavior: Behavior normal.     LABS:   CBC    Component Value Date/Time   WBC 8.4 07/15/2023 0835   RBC 4.23 07/15/2023 0835   HGB 11.8 (L) 07/15/2023 0835   HCT 37.9 07/15/2023 0835   PLT 494 (H) 07/15/2023 0835   MCV 89.6 07/15/2023 0835   MCH 27.9 07/15/2023 0835   MCHC 31.1 07/15/2023 0835   RDW 14.4 07/15/2023 0835   LYMPHSABS 2.1 07/15/2023 0835   MONOABS 0.6 07/15/2023 0835   EOSABS 0.1 07/15/2023 0835   BASOSABS 0.1 07/15/2023 0835    CMP    Component Value Date/Time   NA 135 07/09/2023 1328   K 3.8 07/09/2023  1328   CL 98 07/09/2023 1328   CO2 30 07/09/2023 1328   GLUCOSE 80 07/09/2023 1328   BUN 8 07/09/2023 1328   CREATININE 0.59 07/09/2023 1328  CALCIUM 9.5 07/09/2023 1328   PROT 8.2 07/09/2023 1328   ALBUMIN 4.1 07/09/2023 1328   AST 20 07/09/2023 1328   ALT 21 07/09/2023 1328   ALKPHOS 73 07/09/2023 1328   BILITOT 0.5 07/09/2023 1328   GFRNONAA >60 02/15/2020 0850   GFRAA >60 02/15/2020 0850    No results found for: "CEA1", "CEA" / No results found for: "CEA1", "CEA" No results found for: "PSA1" No results found for: "ZOX096" No results found for: "CAN125"  No results found for: "TOTALPROTELP", "ALBUMINELP", "A1GS", "A2GS", "BETS", "BETA2SER", "GAMS", "MSPIKE", "SPEI" Lab Results  Component Value Date   TIBC 375 07/15/2023   FERRITIN 43 07/15/2023   FERRITIN 21.6 03/21/2018   IRONPCTSAT 16 07/15/2023   Lab Results  Component Value Date   LDH 115 07/15/2023   LDH 207 (H) 09/23/2015   LDH 170 11/16/2010     STUDIES:   No results found.

## 2023-08-19 ENCOUNTER — Inpatient Hospital Stay: Payer: 59 | Admitting: Hematology

## 2023-08-23 ENCOUNTER — Inpatient Hospital Stay: Payer: 59 | Attending: Hematology | Admitting: Hematology

## 2023-08-23 VITALS — BP 123/81 | HR 89 | Temp 98.3°F | Resp 18 | Ht 64.0 in | Wt 226.4 lb

## 2023-08-23 DIAGNOSIS — D75839 Thrombocytosis, unspecified: Secondary | ICD-10-CM | POA: Diagnosis not present

## 2023-08-23 NOTE — Patient Instructions (Addendum)
 [facility] at [facility] Discharge Instructions   You were seen and examined today by Dr. Ellin Saba.  He reviewed the results of your lab work which are mostly normal/stable. Your c-reactive protein and sedimentation rate are elevated, indicating inflammation in the body. This can cause your platelets to go high. Your iron was also low, which can make your platelets go high also. Start on over the counter iron tablets (325 mg) three days a week.   We will see you back in 3 months. We will repeat lab work prior to this visit.   Return as scheduled.    Thank you for choosing [facility] at [facility] to provide your oncology and hematology care.  To afford each patient quality time with our provider, please arrive at least 15 minutes before your scheduled appointment time.   If you have a lab appointment with the Cancer Center please come in thru the Main Entrance and check in at the main information desk.  You need to re-schedule your appointment should you arrive 10 or more minutes late.  We strive to give you quality time with our providers, and arriving late affects you and other patients whose appointments are after yours.  Also, if you no show three or more times for appointments you may be dismissed from the clinic at the providers discretion.     Again, thank you for choosing [facility].  Our hope is that these requests will decrease the amount of time that you wait before being seen by our physicians.       _____________________________________________________________  Should you have questions after your visit to [facility], please contact our office at [phone] and follow the prompts.  Our office hours are 8:00 a.m. and 4:30 p.m. Monday - Friday.  Please note that voicemails left after 4:00 p.m. may not be returned until the following business day.  We are closed weekends and major holidays.  You do have  access to a nurse 24-7, just call the main number to the clinic [phone] and do not press any options, hold on the line and a nurse will answer the phone.    For prescription refill requests, have your pharmacy contact our office and allow 72 hours.    Due to Covid, you will need to wear a mask upon entering the hospital. If you do not have a mask, a mask will be given to you at the Main Entrance upon arrival. For doctor visits, patients may have 1 support person age 33 or older with them. For treatment visits, patients can not have anyone with them due to social distancing guidelines and our immunocompromised population.

## 2023-08-23 NOTE — Progress Notes (Signed)
 [facility] [address] [place] [address]   Clinic Day:  08/23/2023  Referring physician: Pincus Sanes, MD  Patient Care Team: Pincus Sanes, MD as PCP - General (Internal Medicine) Altheimer, Casimiro Needle, MD as Consulting Physician (Endocrinology) Sherian Rein, MD (Obstetrics and Gynecology) Gelene Mink, NP (Gastroenterology)   ASSESSMENT & PLAN:   Assessment:  1.  Thrombocytosis: - Seen at the request of Dr. Lawerance Bach. - Elevated platelet count since August 2021.  WBC and Hb in the normal range. - Has history of migraines.  No aqua genic pruritus/erythromelalgia's/thrombosis. - 07/15/2023: Negative JAK2 V6 67F, negative CALR/MPL.  Negative BCR/ABL.  2. 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 No tobacco use.  -No family history of thrombocytosis. Maternal uncle had a DVT and PE. Maternal grandmother had metastatic ovarian cancer. Maternal aunt had cervical cancer. Maternal uncle had pancreatic cancer.   Plan:  1.  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 - Inflammatory markers ESR (51), CRP (4.7) were elevated.  ANA and rheumatoid factor were negative. - Myeloproliferative neoplasm workup was negative as evidenced by negative JAK2 V667F and BCR/ABL by FISH. - Ferritin was borderline low at 43 with percent saturation 16.  Hemoglobin was 11.8. 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  Recommend starting iron tablet 3 times weekly on Monday, Wednesday and Friday.  Will repeat labs in 3 months with repeat CBC, ferritin and iron panel along with ESR and CRP. - If there is any worsening of platelet count, will consider bone marrow aspiration and biopsy.   Orders Placed This Encounter  Procedures   CBC with Differential    Standing Status:   Future    Expected Date:   11/16/2023    Expiration Date:   08/22/2024   Iron  and TIBC ([facility] ONLY)    Standing Status:   Future    Expected Date:   11/16/2023    Expiration Date:   08/22/2024   Ferritin    Standing Status:   Future    Expected Date:   11/16/2023    Expiration Date:   08/22/2024   Sedimentation rate    Standing Status:   Future    Expected Date:   11/16/2023    Expiration Date:   08/22/2024   C-reactive protein    Standing Status:   Future    Expected Date:   11/16/2023    Expiration Date:   08/22/2024      [name],acting as a scribe for Doreatha Massed, MD.,have documented all relevant documentation on the behalf of Doreatha Massed, MD,as directed by  Doreatha Massed, MD while in the presence of Doreatha Massed, MD.  I, Doreatha Massed MD, have reviewed the above documentation for accuracy and completeness, and I agree with the above.    Doreatha Massed, MD   3/3/20253:46 PM  CHIEF COMPLAINT/PURPOSE OF CONSULT:   Diagnosis: Thrombocytosis  Current Therapy:  none  HISTORY OF PRESENT ILLNESS:   Cheryl Hampton is a 38 y.o. female presenting to clinic today for evaluation of thrombocytosis at the request of Burns, Bobette Mo, MD.  Patient was seen by her PCP on 07/09/23 and lab work found platelets elevated at 560. Her platelets have been elevated for the past 3 years, with her last platelets higher than the previous years.   Today, she states that she is doing well overall. Her appetite  level is at 100%. Her energy level is at 40-50%. She takes Fioricet for migraines, which she does not have often. She notes occasional ankle swellings. She denies any aquagenic pruritus, erythromelalgias, lupus, rheumatoid arthritis, joint swellings, or skin rashes. She denies any history of DVT's or PE's.    INTERVAL HISTORY:   Cheryl Hampton is a 38 y.o. female presenting to the clinic today for follow-up of thrombocytosis. She was last seen by me on 07/15/23 in consultation.  Today, she states that she is doing  well overall. Her appetite level is at 100%. Her energy level is at 75%.   PAST MEDICAL HISTORY:   Past Medical History: Past Medical History:  Diagnosis Date   Depression    Post partum took lexapro and wellbutrin and it helped   Hypothyroid 2012 dx   post partum hyperthyroid   Migraine headache     Surgical History: Past Surgical History:  Procedure Laterality Date   CHOLECYSTECTOMY N/A 07/18/2019   Procedure: LAPAROSCOPIC CHOLECYSTECTOMY;  Surgeon: Franky Macho, MD;  Location: [facility];  Service: General;  Laterality: N/A;   MOUTH SURGERY     Removal (R) Fallopian tube  09/2009    Social History: Social History   Socioeconomic History   Marital status: Married    Spouse name: Not on file   Number of children: Not on file   Years of education: Not on file   Highest education level: Associate degree: academic program  Occupational History   Not on file  Tobacco Use   Smoking status: Never   Smokeless tobacco: Never  Substance and Sexual Activity   Alcohol use: No   Drug use: No   Sexual activity: Yes    Partners: Male    Comment: spouse  Other Topics Concern   Not on file  Social History Narrative   Not on file   Social Drivers of Health   Financial Resource Strain: Low Risk  (07/06/2023)   Overall Financial Resource Strain (CARDIA)    Difficulty of Paying Living Expenses: Not hard at all  Food Insecurity: No Food Insecurity (07/06/2023)   Hunger Vital Sign    Worried About Running Out of Food in the Last Year: Never true    Ran Out of Food in the Last Year: Never true  Transportation Needs: No Transportation Needs (07/06/2023)   PRAPARE - Administrator, Civil Service (Medical): No    Lack of Transportation (Non-Medical): No  Physical Activity: Sufficiently Active (07/06/2023)   Exercise Vital Sign    Days of Exercise per Week: 4 days    Minutes of Exercise per Session: 60 min  Stress: No Stress Concern Present (07/06/2023)   [employer] of  Occupational Health - Occupational Stress Questionnaire    Feeling of Stress : Only a little  Social Connections: Moderately Integrated (07/06/2023)   Social Connection and Isolation Panel [NHANES]    Frequency of Communication with Friends and Family: More than three times a week    Frequency of Social Gatherings with Friends and Family: Three times a week    Attends Religious Services: 1 to 4 times per year    Active Member of Clubs or Organizations: No    Attends Banker Meetings: Not on file    Marital Status: Married  Intimate Partner Violence: Not At Risk (07/15/2023)   Humiliation, Afraid, Rape, and Kick questionnaire    Fear of Current or Ex-Partner: No    Emotionally Abused: No  Physically Abused: No    Sexually Abused: No    Family History: Family History  Problem Relation Age of Onset   Alcohol abuse Maternal Uncle    Pancreatic cancer Maternal Grandmother    Stroke Maternal Grandfather    Hypertension Mother    Diabetes Mother    Hyperlipidemia Mother    Thyroid disease Neg Hx     Current Medications:  Current Outpatient Medications:    butalbital-acetaminophen-caffeine (FIORICET) 50-325-40 MG tablet, Take 1 tablet by mouth every 4 (four) hours as needed for headache., Disp: 45 tablet, Rfl: 0   levonorgestrel (MIRENA, 52 MG,) 20 MCG/DAY IUD, Mirena 21 mcg/24 hours (8 yrs) 52 mg intrauterine device  Take 1 device every day by intrauterine route., Disp: , Rfl:    levothyroxine (SYNTHROID) 100 MCG tablet, Take 1 tablet (100 mcg total) by mouth daily., Disp: 90 tablet, Rfl: 3   losartan-hydrochlorothiazide (HYZAAR) 50-12.5 MG tablet, Take 1 tablet by mouth daily., Disp: 90 tablet, Rfl: 0   venlafaxine XR (EFFEXOR-XR) 75 MG 24 hr capsule, Take 1 capsule (75 mg total) by mouth daily with breakfast., Disp: 90 capsule, Rfl: 1   Allergies: Allergies  Allergen Reactions   Gabapentin     Made her feel funny    REVIEW OF SYSTEMS:   Review of Systems   Constitutional:  Negative for chills, fatigue and fever.  HENT:   Negative for lump/mass, mouth sores, nosebleeds, sore throat and trouble swallowing.   Eyes:  Negative for eye problems.  Respiratory:  Negative for cough and shortness of breath.   Cardiovascular:  Negative for chest pain, leg swelling and palpitations.  Gastrointestinal:  Negative for abdominal pain, constipation, diarrhea, nausea and vomiting.  Genitourinary:  Negative for bladder incontinence, difficulty urinating, dysuria, frequency, hematuria and nocturia.   Musculoskeletal:  Negative for arthralgias, back pain, flank pain, myalgias and neck pain.  Skin:  Negative for itching and rash.  Neurological:  Negative for dizziness, headaches and numbness.  Hematological:  Does not bruise/bleed easily.  Psychiatric/Behavioral:  Negative for depression, sleep disturbance and suicidal ideas. The patient is not nervous/anxious.   All other systems reviewed and are negative.    VITALS:   Blood pressure 123/81, pulse 89, temperature 98.3 F (36.8 C), temperature source Oral, resp. rate 18, height 5\' 4"  (1.626 m), weight 226 lb 6.6 oz (102.7 kg), SpO2 98%, unknown if currently breastfeeding.  Wt Readings from Last 3 Encounters:  08/23/23 226 lb 6.6 oz (102.7 kg)  07/15/23 234 lb (106.1 kg)  07/09/23 232 lb (105.2 kg)    Body mass index is 38.86 kg/m.   PHYSICAL EXAM:   Physical Exam Vitals and nursing note reviewed. Exam conducted with a chaperone present.  Constitutional:      Appearance: Normal appearance.  Cardiovascular:     Rate and Rhythm: Normal rate and regular rhythm.     Pulses: Normal pulses.     Heart sounds: Normal heart sounds.  Pulmonary:     Effort: Pulmonary effort is normal.     Breath sounds: Normal breath sounds.  Abdominal:     Palpations: Abdomen is soft. There is no hepatomegaly, splenomegaly or mass.     Tenderness: There is no abdominal tenderness.  Musculoskeletal:     Right lower leg:  No edema.     Left lower leg: No edema.  Lymphadenopathy:     Cervical: No cervical adenopathy.     Right cervical: No superficial, deep or posterior cervical adenopathy.    Left  cervical: No superficial, deep or posterior cervical adenopathy.     Upper Body:     Right upper body: No supraclavicular or axillary adenopathy.     Left upper body: No supraclavicular or axillary adenopathy.  Neurological:     General: No focal deficit present.     Mental Status: She is alert and oriented to person, place, and time.  Psychiatric:        Mood and Affect: Mood normal.        Behavior: Behavior normal.     LABS:   CBC    Component Value Date/Time   WBC 8.4 07/15/2023 0835   RBC 4.23 07/15/2023 0835   HGB 11.8 (L) 07/15/2023 0835   HCT 37.9 07/15/2023 0835   PLT 494 (H) 07/15/2023 0835   MCV 89.6 07/15/2023 0835   MCH 27.9 07/15/2023 0835   MCHC 31.1 07/15/2023 0835   RDW 14.4 07/15/2023 0835   LYMPHSABS 2.1 07/15/2023 0835   MONOABS 0.6 07/15/2023 0835   EOSABS 0.1 07/15/2023 0835   BASOSABS 0.1 07/15/2023 0835    CMP    Component Value Date/Time   NA 135 07/09/2023 1328   K 3.8 07/09/2023 1328   CL 98 07/09/2023 1328   CO2 30 07/09/2023 1328   GLUCOSE 80 07/09/2023 1328   BUN 8 07/09/2023 1328   CREATININE 0.59 07/09/2023 1328   CALCIUM 9.5 07/09/2023 1328   PROT 8.2 07/09/2023 1328   ALBUMIN 4.1 07/09/2023 1328   AST 20 07/09/2023 1328   ALT 21 07/09/2023 1328   ALKPHOS 73 07/09/2023 1328   BILITOT 0.5 07/09/2023 1328   GFRNONAA >60 02/15/2020 0850   GFRAA >60 02/15/2020 0850    No results found for: "CEA1", "CEA" / No results found for: "CEA1", "CEA" No results found for: "PSA1" No results found for: "ZOX096" No results found for: "CAN125"  No results found for: "TOTALPROTELP", "ALBUMINELP", "A1GS", "A2GS", "BETS", "BETA2SER", "GAMS", "MSPIKE", "SPEI" Lab Results  Component Value Date   TIBC 375 07/15/2023   FERRITIN 43 07/15/2023   FERRITIN 21.6  03/21/2018   IRONPCTSAT 16 07/15/2023   Lab Results  Component Value Date   LDH 115 07/15/2023   LDH 207 (H) 09/23/2015   LDH 170 11/16/2010     STUDIES:   No results found.

## 2023-09-06 ENCOUNTER — Other Ambulatory Visit (HOSPITAL_COMMUNITY): Payer: Self-pay

## 2023-09-21 DIAGNOSIS — M79674 Pain in right toe(s): Secondary | ICD-10-CM | POA: Diagnosis not present

## 2023-09-21 DIAGNOSIS — L600 Ingrowing nail: Secondary | ICD-10-CM | POA: Diagnosis not present

## 2023-11-01 ENCOUNTER — Other Ambulatory Visit (HOSPITAL_COMMUNITY): Payer: Self-pay

## 2023-11-12 ENCOUNTER — Inpatient Hospital Stay: Attending: Hematology

## 2023-11-12 DIAGNOSIS — Z8041 Family history of malignant neoplasm of ovary: Secondary | ICD-10-CM | POA: Diagnosis not present

## 2023-11-12 DIAGNOSIS — Z800 Family history of malignant neoplasm of digestive organs: Secondary | ICD-10-CM | POA: Diagnosis not present

## 2023-11-12 DIAGNOSIS — Z8049 Family history of malignant neoplasm of other genital organs: Secondary | ICD-10-CM | POA: Insufficient documentation

## 2023-11-12 DIAGNOSIS — D75839 Thrombocytosis, unspecified: Secondary | ICD-10-CM | POA: Diagnosis not present

## 2023-11-12 DIAGNOSIS — D509 Iron deficiency anemia, unspecified: Secondary | ICD-10-CM | POA: Insufficient documentation

## 2023-11-12 LAB — CBC WITH DIFFERENTIAL/PLATELET
Abs Immature Granulocytes: 0.03 10*3/uL (ref 0.00–0.07)
Basophils Absolute: 0 10*3/uL (ref 0.0–0.1)
Basophils Relative: 0 %
Eosinophils Absolute: 0.1 10*3/uL (ref 0.0–0.5)
Eosinophils Relative: 2 %
HCT: 35.1 % — ABNORMAL LOW (ref 36.0–46.0)
Hemoglobin: 10.7 g/dL — ABNORMAL LOW (ref 12.0–15.0)
Immature Granulocytes: 0 %
Lymphocytes Relative: 28 %
Lymphs Abs: 2.2 10*3/uL (ref 0.7–4.0)
MCH: 26.7 pg (ref 26.0–34.0)
MCHC: 30.5 g/dL (ref 30.0–36.0)
MCV: 87.5 fL (ref 80.0–100.0)
Monocytes Absolute: 0.6 10*3/uL (ref 0.1–1.0)
Monocytes Relative: 8 %
Neutro Abs: 4.8 10*3/uL (ref 1.7–7.7)
Neutrophils Relative %: 62 %
Platelets: 444 10*3/uL — ABNORMAL HIGH (ref 150–400)
RBC: 4.01 MIL/uL (ref 3.87–5.11)
RDW: 14.9 % (ref 11.5–15.5)
WBC: 7.7 10*3/uL (ref 4.0–10.5)
nRBC: 0 % (ref 0.0–0.2)

## 2023-11-12 LAB — IRON AND TIBC
Iron: 33 ug/dL (ref 28–170)
Saturation Ratios: 10 % — ABNORMAL LOW (ref 10.4–31.8)
TIBC: 326 ug/dL (ref 250–450)
UIBC: 293 ug/dL

## 2023-11-12 LAB — SEDIMENTATION RATE: Sed Rate: 83 mm/h — ABNORMAL HIGH (ref 0–22)

## 2023-11-12 LAB — C-REACTIVE PROTEIN: CRP: 7.8 mg/dL — ABNORMAL HIGH (ref <1.0)

## 2023-11-12 LAB — FERRITIN: Ferritin: 71 ng/mL (ref 11–307)

## 2023-11-14 NOTE — Progress Notes (Unsigned)
 [facility] [address] [place] [address]   CLINIC:  Medical Oncology/Hematology  PCP:  Colene Dauphin, MD [address] [place] [address] [phone]   REASON FOR VISIT:  Follow-up for thrombocytosis  CURRENT THERAPY: Surveillance  INTERVAL HISTORY:   Cheryl Hampton 38 y.o. female returns for routine follow-up of thrombocytosis.  She was last seen by Dr. Cheree Cords on 08/23/2023.  At today's visit, she reports feeling fair apart from some fatigue.  No recent hospitalizations, surgeries, or changes in baseline health status.  She has been taking iron tablet daily, which she is tolerating well.  She reports irregular light menstrual periods due to IUD in place.  She denies any heavy menstrual bleeding.  She does report fatigue for the past 2 months in addition to some ice pica.  No history of DVT or PE.  No aquagenic pruritus, erythromelalgia, or vasomotor symptoms.  She denies any B symptoms, abdominal pain, nausea, or early satiety.  No history of inflammatory or autoimmune conditions.  She has 25% energy and 100% appetite. She endorses that she is maintaining a stable weight.  ASSESSMENT & PLAN:  1.  Thrombocytosis: - Seen at the request of Dr. Oma Bias (PCP). - Elevated platelet count since August 2021.  WBC and Hb in the normal range. - She has history of migraines. - She does not use tobacco. - No history of DVT or PE. - No history of autoimmune or inflammatory conditions. - No aquagenic pruritus, erythromelalgia, or vasomotor symptoms. - MPN workup NEGATIVE: Negative JAK2 V617F, negative CALR, MPL, Exons 12-15.  Negative BCR/ABL. - ANA and rheumatoid factor negative.  Inflammatory markers (ESR and CRP) have been elevated - Borderline iron deficiency noted (07/15/2023) with ferritin 43, 16% saturation, Hgb 11.8. - Taking iron tablet daily since March 2025.   - Most recent labs (11/12/2023): Hgb 10.7/MCV 87.5, platelets 444.  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   Platelets may be elevated in reaction to obesity (increased IL-6 secretion by adipocytes), iron deficiency, prediabetes (associated with increased inflammation, elevated fibrinogen levels, insulin resistance, and subclinical inflammation) - PLAN: Iron supplementation as below.  - Continue surveillance, but consider bone marrow biopsy if there is any worsening in platelet count. - Labs and RTC in 3 months   2.  Iron deficiency anemia - Labs from 11/12/2023 show Hgb 10.7 with ferritin 71 and iron saturation 10% - Denies rectal bleeding or melena.  Reports irregular light menstrual cycles due to IUD. - She is taking iron tablet daily. - Symptomatic with fatigue and ice pica - PLAN: Continue daily iron supplement. - IV Venofer 400 mg x 1 (discussed side effects and risk of potential allergic reaction.  Patient agreeable to proceed.) - Repeat labs and RTC in 3 months.    3.  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 - No tobacco use.  - No family history of thrombocytosis. Maternal uncle had a DVT and PE. Maternal grandmother had metastatic ovarian cancer. Maternal aunt had cervical cancer. Maternal uncle had pancreatic cancer.   PLAN SUMMARY: >> Venofer 400 mg x 1 >> Stool cards x 3 >> Labs in 3 months = CBC/D, ferritin, iron/TIBC >> OFFICE visit in 3 months     REVIEW OF SYSTEMS:   Review of Systems  Constitutional:  Positive for fatigue. Negative for appetite change,  chills, diaphoresis, fever and unexpected weight change.  HENT:   Negative for lump/mass and nosebleeds.   Eyes:  Negative for eye problems.  Respiratory:  Negative for cough, hemoptysis and shortness of breath.   Cardiovascular:  Negative for chest pain, leg swelling and palpitations.  Gastrointestinal:  Negative for  abdominal pain, blood in stool, constipation, diarrhea, nausea and vomiting.  Genitourinary:  Negative for hematuria.   Skin: Negative.   Neurological:  Negative for dizziness, headaches and light-headedness.  Hematological:  Does not bruise/bleed easily.     PHYSICAL EXAM:  ECOG PERFORMANCE STATUS: 1 - Symptomatic but completely ambulatory  Vitals:   11/16/23 0829  BP: 123/74  Pulse: 84  Resp: 18  Temp: (!) 97.2 F (36.2 C)  SpO2: 99%   Filed Weights   11/16/23 0829  Weight: 225 lb (102.1 kg)   Physical Exam Constitutional:      Appearance: Normal appearance. She is obese.  Cardiovascular:     Heart sounds: Normal heart sounds.  Pulmonary:     Breath sounds: Normal breath sounds.  Neurological:     General: No focal deficit present.     Mental Status: Mental status is at baseline.  Psychiatric:        Behavior: Behavior normal. Behavior is cooperative.     PAST MEDICAL/SURGICAL HISTORY:  Past Medical History:  Diagnosis Date   Depression    Post partum took lexapro  and wellbutrin  and it helped   Hypothyroid 2012 dx   post partum hyperthyroid   Migraine headache    Past Surgical History:  Procedure Laterality Date   CHOLECYSTECTOMY N/A 07/18/2019   Procedure: LAPAROSCOPIC CHOLECYSTECTOMY;  Surgeon: Alanda Allegra, MD;  Location: [facility];  Service: General;  Laterality: N/A;   MOUTH SURGERY     Removal (R) Fallopian tube  09/2009    SOCIAL HISTORY:  Social History   Socioeconomic History   Marital status: Married    Spouse name: Not on file   Number of children: Not on file   Years of education: Not on file   Highest education level: Associate degree: academic program  Occupational History   Not on file  Tobacco Use   Smoking status: Never   Smokeless tobacco: Never  Substance and Sexual Activity   Alcohol use: No   Drug use: No   Sexual activity: Yes    Partners: Male    Comment: spouse  Other Topics Concern   Not on file  Social History  Narrative   Not on file   Social Drivers of Health   Financial Resource Strain: Low Risk  (07/06/2023)   Overall Financial Resource Strain (CARDIA)    Difficulty of Paying Living Expenses: Not hard at all  Food Insecurity: No Food Insecurity (07/06/2023)   Hunger Vital Sign    Worried About Running Out of Food in the Last Year: Never true    Ran Out of Food in the Last Year: Never true  Transportation Needs: No Transportation Needs (07/06/2023)   PRAPARE - Administrator, Civil Service (Medical): No    Lack of Transportation (Non-Medical): No  Physical Activity: Sufficiently Active (07/06/2023)   Exercise Vital Sign    Days of Exercise per Week: 4 days    Minutes of Exercise per Session: 60 min  Stress: No Stress Concern Present (07/06/2023)   [employer] of Occupational Health - Occupational Stress Questionnaire    Feeling of Stress : Only a little  Social Connections: Moderately Integrated (07/06/2023)  Social Connection and Isolation Panel [NHANES]    Frequency of Communication with Friends and Family: More than three times a week    Frequency of Social Gatherings with Friends and Family: Three times a week    Attends Religious Services: 1 to 4 times per year    Active Member of Clubs or Organizations: No    Attends Banker Meetings: Not on file    Marital Status: Married  Catering manager Violence: Not At Risk (07/15/2023)   Humiliation, Afraid, Rape, and Kick questionnaire    Fear of Current or Ex-Partner: No    Emotionally Abused: No    Physically Abused: No    Sexually Abused: No    FAMILY HISTORY:  Family History  Problem Relation Age of Onset   Alcohol abuse Maternal Uncle    Pancreatic cancer Maternal Grandmother    Stroke Maternal Grandfather    Hypertension Mother    Diabetes Mother    Hyperlipidemia Mother    Thyroid  disease Neg Hx     CURRENT MEDICATIONS:  Outpatient Encounter Medications as of 11/16/2023  Medication Sig    ferrous sulfate 325 (65 FE) MG tablet Take 325 mg by mouth daily with breakfast.   Misc Natural Products (PUMPKIN SEED OIL) CAPS Take 3 capsules by mouth daily.   Vitamin D, Ergocalciferol, (DRISDOL) 1.25 MG (50000 UNIT) CAPS capsule Take 50,000 Units by mouth every 7 (seven) days.   butalbital -acetaminophen -caffeine  (FIORICET ) 50-325-40 MG tablet Take 1 tablet by mouth every 4 (four) hours as needed for headache.   levonorgestrel  (MIRENA , 52 MG,) 20 MCG/DAY IUD Mirena  21 mcg/24 hours (8 yrs) 52 mg intrauterine device  Take 1 device every day by intrauterine route.   levothyroxine  (SYNTHROID ) 100 MCG tablet Take 1 tablet (100 mcg total) by mouth daily.   losartan -hydrochlorothiazide  (HYZAAR ) 50-12.5 MG tablet Take 1 tablet by mouth daily.   venlafaxine  XR (EFFEXOR -XR) 75 MG 24 hr capsule Take 1 capsule (75 mg total) by mouth daily with breakfast.   No facility-administered encounter medications on file as of 11/16/2023.    ALLERGIES:  Allergies  Allergen Reactions   Gabapentin      Made her feel funny    LABORATORY DATA:  I have reviewed the labs as listed.  CBC    Component Value Date/Time   WBC 7.7 11/12/2023 1205   RBC 4.01 11/12/2023 1205   HGB 10.7 (L) 11/12/2023 1205   HCT 35.1 (L) 11/12/2023 1205   PLT 444 (H) 11/12/2023 1205   MCV 87.5 11/12/2023 1205   MCH 26.7 11/12/2023 1205   MCHC 30.5 11/12/2023 1205   RDW 14.9 11/12/2023 1205   LYMPHSABS 2.2 11/12/2023 1205   MONOABS 0.6 11/12/2023 1205   EOSABS 0.1 11/12/2023 1205   BASOSABS 0.0 11/12/2023 1205      Latest Ref Rng & Units 07/09/2023    1:28 PM 01/23/2022    1:33 PM 11/26/2020    4:10 PM  CMP  Glucose 70 - 99 mg/dL 80  865  81   BUN 6 - 23 mg/dL 8  10  12    Creatinine 0.40 - 1.20 mg/dL 7.84  6.96  2.95   Sodium 135 - 145 mEq/L 135  136  136   Potassium 3.5 - 5.1 mEq/L 3.8  3.5  3.9   Chloride 96 - 112 mEq/L 98  99  98   CO2 19 - 32 mEq/L 30  31  31    Calcium 8.4 - 10.5 mg/dL 9.5  9.6  9.4  Total Protein  6.0 - 8.3 g/dL 8.2  7.9  7.5   Total Bilirubin 0.2 - 1.2 mg/dL 0.5  0.4  0.4   Alkaline Phos 39 - 117 U/L 73  57  49   AST 0 - 37 U/L 20  30  16    ALT 0 - 35 U/L 21  37  20     DIAGNOSTIC IMAGING:  I have independently reviewed the relevant imaging and discussed with the patient.   WRAP UP:  All questions were answered. The patient knows to call the clinic with any problems, questions or concerns.  Medical decision making: Moderate  Time spent on visit: I spent 20 minutes counseling the patient face to face. The total time spent in the appointment was 30 minutes and more than 50% was on counseling.  Sonnie Dusky, PA-C  11/16/2023 9:06 AM

## 2023-11-16 ENCOUNTER — Inpatient Hospital Stay (HOSPITAL_BASED_OUTPATIENT_CLINIC_OR_DEPARTMENT_OTHER): Admitting: Physician Assistant

## 2023-11-16 VITALS — BP 123/74 | HR 84 | Temp 97.2°F | Resp 18 | Ht 64.0 in | Wt 225.0 lb

## 2023-11-16 DIAGNOSIS — Z800 Family history of malignant neoplasm of digestive organs: Secondary | ICD-10-CM | POA: Diagnosis not present

## 2023-11-16 DIAGNOSIS — D509 Iron deficiency anemia, unspecified: Secondary | ICD-10-CM | POA: Insufficient documentation

## 2023-11-16 DIAGNOSIS — Z8041 Family history of malignant neoplasm of ovary: Secondary | ICD-10-CM | POA: Diagnosis not present

## 2023-11-16 DIAGNOSIS — D75839 Thrombocytosis, unspecified: Secondary | ICD-10-CM

## 2023-11-16 DIAGNOSIS — Z8049 Family history of malignant neoplasm of other genital organs: Secondary | ICD-10-CM | POA: Diagnosis not present

## 2023-11-16 NOTE — Patient Instructions (Addendum)
 [facility] at [facility] **VISIT SUMMARY & IMPORTANT INSTRUCTIONS **   You were seen today by Sheril Dines PA-C for your elevated platelets.    ELEVATED PLATELETS: Your labs did NOT show any evidence of genetic mutation or cancerous cause of high platelets.  We suspect that your elevated platelets are "reactive" in the setting of inflammation and other causes discussed below. Iron deficiency: We do not know the cause of your iron deficiency. Please complete stool cards x 3 to see if you have any blood in your bowel movements. Continue taking iron tablet daily. Will also schedule you for IV iron x 1 dose.  (Please see attached handout for important information regarding possible side effects). Obesity: Adipocytes ("fat cells") produce pro-inflammatory chemicals.  Prediabetes: Insulin resistance and other factors seen in prediabetes have been shown to increase levels of inflammation.  FOLLOW-UP APPOINTMENT: Repeat labs and office visit in 3 months.  ** Thank you for trusting me with your healthcare!  I strive to provide all of my patients with quality care at each visit.  If you receive a survey for this visit, I would be so grateful to you for taking the time to provide feedback.  Thank you in advance!  ~ Kamdyn Covel                   Dr. Paulett Boros   &   Sheril Dines, PA-C   - - - - - - - - - - - - - - - - - -    Thank you for choosing [facility] at [facility] to provide your oncology and hematology care.  To afford each patient quality time with our provider, please arrive at least 15 minutes before your scheduled appointment time.   If you have a lab appointment with the Cancer Center please come in thru the Main Entrance and check in at the main information desk.  You need to re-schedule your appointment should you arrive 10 or more minutes late.  We strive to give you quality time with our providers, and arriving late  affects you and other patients whose appointments are after yours.  Also, if you no show three or more times for appointments you may be dismissed from the clinic at the providers discretion.     Again, thank you for choosing [facility].  Our hope is that these requests will decrease the amount of time that you wait before being seen by our physicians.       _____________________________________________________________  Should you have questions after your visit to [facility], please contact our office at [phone] and follow the prompts.  Our office hours are 8:00 a.m. and 4:30 p.m. Monday - Friday.  Please note that voicemails left after 4:00 p.m. may not be returned until the following business day.  We are closed weekends and major holidays.  You do have access to a nurse 24-7, just call the main number to the clinic [phone] and do not press any options, hold on the line and a nurse will answer the phone.    For prescription refill requests, have your pharmacy contact our office and allow 72 hours.

## 2023-11-18 ENCOUNTER — Other Ambulatory Visit (HOSPITAL_COMMUNITY): Payer: Self-pay

## 2023-11-18 ENCOUNTER — Other Ambulatory Visit: Payer: Self-pay | Admitting: Internal Medicine

## 2023-11-18 ENCOUNTER — Other Ambulatory Visit: Payer: Self-pay

## 2023-11-18 MED ORDER — LOSARTAN POTASSIUM-HCTZ 50-12.5 MG PO TABS
1.0000 | ORAL_TABLET | Freq: Every day | ORAL | 0 refills | Status: DC
  Filled 2023-11-18: qty 90, 90d supply, fill #0

## 2023-11-19 DIAGNOSIS — H52223 Regular astigmatism, bilateral: Secondary | ICD-10-CM | POA: Diagnosis not present

## 2023-11-19 DIAGNOSIS — H5213 Myopia, bilateral: Secondary | ICD-10-CM | POA: Diagnosis not present

## 2023-11-26 ENCOUNTER — Inpatient Hospital Stay: Attending: Physician Assistant

## 2023-11-26 VITALS — BP 114/71 | HR 82 | Temp 98.1°F | Resp 18

## 2023-11-26 DIAGNOSIS — D509 Iron deficiency anemia, unspecified: Secondary | ICD-10-CM | POA: Insufficient documentation

## 2023-11-26 MED ORDER — SODIUM CHLORIDE 0.9 % IV SOLN
400.0000 mg | Freq: Once | INTRAVENOUS | Status: AC
  Administered 2023-11-26: 400.005 mg via INTRAVENOUS
  Filled 2023-11-26: qty 400

## 2023-11-26 MED ORDER — SODIUM CHLORIDE 0.9 % IV SOLN: INTRAVENOUS | Status: DC

## 2023-11-26 NOTE — Progress Notes (Signed)
 Patient presents today for iron infusion.  Patient is in satisfactory condition with no new complaints voiced.  Vital signs are stable.  IV placed in L arm.  IV flushed well with good blood return noted.  Tylenol  and Zyrtec taken at 0900 am prior to visit.  We will proceed with infusion per provider orders.    Patient tolerated infusion well with no complaints voiced.  Patient left ambulatory in stable condition.  Vital signs stable at discharge.  Follow up as scheduled.

## 2023-11-26 NOTE — Patient Instructions (Signed)
 [facility] [place] - A DEPT OF [facility]  Discharge Instructions: Thank you for choosing [facility] to provide your oncology and hematology care.  If you have a lab appointment with the Cancer Center - please note that after April 8th, 2024, all labs will be drawn in the cancer center.  You do not have to check in or register with the main entrance as you have in the past but will complete your check-in in the cancer center.  Wear comfortable clothing and clothing appropriate for easy access to any Portacath or PICC line.   We strive to give you quality time with your provider. You may need to reschedule your appointment if you arrive late (15 or more minutes).  Arriving late affects you and other patients whose appointments are after yours.  Also, if you miss three or more appointments without notifying the office, you may be dismissed from the clinic at the provider's discretion.      For prescription refill requests, have your pharmacy contact our office and allow 72 hours for refills to be completed.    Today you received the following:  Venofer.    Iron Sucrose Injection What is this medication? IRON SUCROSE (EYE ern SOO krose) treats low levels of iron (iron deficiency anemia) in people with kidney disease. Iron is a mineral that plays an important role in making red blood cells, which carry oxygen from your lungs to the rest of your body. This medicine may be used for other purposes; ask your health care provider or pharmacist if you have questions. COMMON BRAND NAME(S): Venofer What should I tell my care team before I take this medication? They need to know if you have any of these conditions: Anemia not caused by low iron levels Heart disease High levels of iron in the blood Kidney disease Liver disease An unusual or allergic reaction to iron, other medications, foods, dyes, or preservatives Pregnant or trying to get  pregnant Breastfeeding How should I use this medication? This medication is infused into a vein. It is given by your care team in a hospital or clinic setting. Talk to your care team about the use of this medication in children. While it may be prescribed for children as young as 2 years for selected conditions, precautions do apply. Overdosage: If you think you have taken too much of this medicine contact a poison control center or emergency room at once. NOTE: This medicine is only for you. Do not share this medicine with others. What if I miss a dose? Keep appointments for follow-up doses. It is important not to miss your dose. Call your care team if you are unable to keep an appointment. What may interact with this medication? Do not take this medication with any of the following: Deferoxamine Dimercaprol Other iron products This medication may also interact with the following: Chloramphenicol Deferasirox This list may not describe all possible interactions. Give your health care provider a list of all the medicines, herbs, non-prescription drugs, or dietary supplements you use. Also tell them if you smoke, drink alcohol, or use illegal drugs. Some items may interact with your medicine. What should I watch for while using this medication? Visit your care team for regular checks on your progress. Tell your care team if your symptoms do not start to get better or if they get worse. You may need blood work done while you are taking this medication. You may need to eat  more foods that contain iron. Talk to your care team. Foods that contain iron include whole grains or cereals, dried fruits, beans, peas, leafy green vegetables, and organ meats (liver, kidney). What side effects may I notice from receiving this medication? Side effects that you should report to your care team as soon as possible: Allergic reactions--skin rash, itching, hives, swelling of the face, lips, tongue, or throat Low  blood pressure--dizziness, feeling faint or lightheaded, blurry vision Shortness of breath Side effects that usually do not require medical attention (report to your care team if they continue or are bothersome): Flushing Headache Joint pain Muscle pain Nausea Pain, redness, or irritation at injection site This list may not describe all possible side effects. Call your doctor for medical advice about side effects. You may report side effects to FDA at 1-800-FDA-1088. Where should I keep my medication? This medication is given in a hospital or clinic. It will not be stored at home. NOTE: This sheet is a summary. It may not cover all possible information. If you have questions about this medicine, talk to your doctor, pharmacist, or health care provider.  2024 Elsevier/Gold Standard (2023-01-27 00:00:00)   To help prevent nausea and vomiting after your treatment, we encourage you to take your nausea medication as directed.  BELOW ARE SYMPTOMS THAT SHOULD BE REPORTED IMMEDIATELY: *FEVER GREATER THAN 100.4 F (38 C) OR HIGHER *CHILLS OR SWEATING *NAUSEA AND VOMITING THAT IS NOT CONTROLLED WITH YOUR NAUSEA MEDICATION *UNUSUAL SHORTNESS OF BREATH *UNUSUAL BRUISING OR BLEEDING *URINARY PROBLEMS (pain or burning when urinating, or frequent urination) *BOWEL PROBLEMS (unusual diarrhea, constipation, pain near the anus) TENDERNESS IN MOUTH AND THROAT WITH OR WITHOUT PRESENCE OF ULCERS (sore throat, sores in mouth, or a toothache) UNUSUAL RASH, SWELLING OR PAIN  UNUSUAL VAGINAL DISCHARGE OR ITCHING   Items with * indicate a potential emergency and should be followed up as soon as possible or go to the Emergency Department if any problems should occur.  Please show the CHEMOTHERAPY ALERT CARD or IMMUNOTHERAPY ALERT CARD at check-in to the Emergency Department and triage nurse.  Should you have questions after your visit or need to cancel or reschedule your appointment, please contact [facility] CANC[facility]  CT[facility] [place] - A DEPT OF [facility] [phone]  and follow the prompts.  Office hours are 8:00 a.m. to 4:30 p.m. Monday - Friday. Please note that voicemails left after 4:00 p.m. may not be returned until the following business day.  We are closed weekends and major holidays. You have access to a nurse at all times for urgent questions. Please call the main number to the clinic [phone] and follow the prompts.  For any non-urgent questions, you may also contact your provider using MyChart. We now offer e-Visits for anyone 24 and older to request care online for non-urgent symptoms. For details visit [email or web address].   Also download the MyChart app! Go to the app store, search "MyChart", open the app, select [place], and log in with your MyChart username and password.

## 2023-12-08 ENCOUNTER — Other Ambulatory Visit (HOSPITAL_COMMUNITY): Payer: Self-pay

## 2024-01-24 ENCOUNTER — Other Ambulatory Visit (HOSPITAL_COMMUNITY): Payer: Self-pay

## 2024-01-24 ENCOUNTER — Other Ambulatory Visit: Payer: Self-pay | Admitting: Internal Medicine

## 2024-01-24 MED ORDER — VENLAFAXINE HCL ER 75 MG PO CP24
75.0000 mg | ORAL_CAPSULE | Freq: Every day | ORAL | 0 refills | Status: DC
  Filled 2024-01-24: qty 30, 30d supply, fill #0

## 2024-02-14 ENCOUNTER — Other Ambulatory Visit: Payer: Self-pay | Admitting: Internal Medicine

## 2024-02-15 ENCOUNTER — Other Ambulatory Visit (HOSPITAL_COMMUNITY): Payer: Self-pay

## 2024-02-15 ENCOUNTER — Inpatient Hospital Stay: Attending: Physician Assistant

## 2024-02-15 ENCOUNTER — Other Ambulatory Visit: Payer: Self-pay

## 2024-02-15 DIAGNOSIS — D509 Iron deficiency anemia, unspecified: Secondary | ICD-10-CM | POA: Diagnosis not present

## 2024-02-15 DIAGNOSIS — D75839 Thrombocytosis, unspecified: Secondary | ICD-10-CM

## 2024-02-15 LAB — IRON AND TIBC
Iron: 26 ug/dL — ABNORMAL LOW (ref 28–170)
Saturation Ratios: 9 % — ABNORMAL LOW (ref 10.4–31.8)
TIBC: 294 ug/dL (ref 250–450)
UIBC: 268 ug/dL

## 2024-02-15 LAB — CBC WITH DIFFERENTIAL/PLATELET
Abs Immature Granulocytes: 0.03 K/uL (ref 0.00–0.07)
Basophils Absolute: 0.1 K/uL (ref 0.0–0.1)
Basophils Relative: 1 %
Eosinophils Absolute: 0.1 K/uL (ref 0.0–0.5)
Eosinophils Relative: 1 %
HCT: 35.2 % — ABNORMAL LOW (ref 36.0–46.0)
Hemoglobin: 11 g/dL — ABNORMAL LOW (ref 12.0–15.0)
Immature Granulocytes: 0 %
Lymphocytes Relative: 24 %
Lymphs Abs: 2.2 K/uL (ref 0.7–4.0)
MCH: 27.3 pg (ref 26.0–34.0)
MCHC: 31.3 g/dL (ref 30.0–36.0)
MCV: 87.3 fL (ref 80.0–100.0)
Monocytes Absolute: 0.7 K/uL (ref 0.1–1.0)
Monocytes Relative: 7 %
Neutro Abs: 6.2 K/uL (ref 1.7–7.7)
Neutrophils Relative %: 67 %
Platelets: 540 K/uL — ABNORMAL HIGH (ref 150–400)
RBC: 4.03 MIL/uL (ref 3.87–5.11)
RDW: 14.9 % (ref 11.5–15.5)
WBC: 9.2 K/uL (ref 4.0–10.5)
nRBC: 0 % (ref 0.0–0.2)

## 2024-02-15 LAB — FERRITIN: Ferritin: 186 ng/mL (ref 11–307)

## 2024-02-15 MED ORDER — LOSARTAN POTASSIUM-HCTZ 50-12.5 MG PO TABS
1.0000 | ORAL_TABLET | Freq: Every day | ORAL | 0 refills | Status: DC
  Filled 2024-02-15: qty 90, 90d supply, fill #0

## 2024-02-17 ENCOUNTER — Encounter: Payer: Self-pay | Admitting: Internal Medicine

## 2024-02-17 NOTE — Progress Notes (Unsigned)
 Subjective:    Patient ID: Cheryl Hampton, female    DOB: 06/02/1986, 38 y.o.   MRN: [id number]     HPI Cheryl Hampton is here for follow up of her chronic medical problems.  Really tired.   Getting a lot of sleep and quality is good.  She is not sure if the fatigue is related to work or just in general.  Had blood work done couple of days ago for hematology-iron  levels still low.  Has follow-up with them next week.    Medications and allergies reviewed with patient and updated if appropriate.  Current Outpatient Medications on File Prior to Visit  Medication Sig Dispense Refill   butalbital -acetaminophen -caffeine  (FIORICET ) 50-325-40 MG tablet Take 1 tablet by mouth every 4 (four) hours as needed for headache. 45 tablet 0   ferrous sulfate 325 (65 FE) MG tablet Take 325 mg by mouth daily with breakfast.     levonorgestrel  (MIRENA , 52 MG,) 20 MCG/DAY IUD Mirena  21 mcg/24 hours (8 yrs) 52 mg intrauterine device  Take 1 device every day by intrauterine route.     levothyroxine  (SYNTHROID ) 100 MCG tablet Take 1 tablet (100 mcg total) by mouth daily. 90 tablet 3   losartan -hydrochlorothiazide  (HYZAAR ) 50-12.5 MG tablet Take 1 tablet by mouth daily. 90 tablet 0   Misc Natural Products (PUMPKIN SEED OIL) CAPS Take 3 capsules by mouth daily.     Vitamin D, Ergocalciferol, (DRISDOL) 1.25 MG (50000 UNIT) CAPS capsule Take 50,000 Units by mouth every 7 (seven) days.     No current facility-administered medications on file prior to visit.     Review of Systems  Constitutional:  Negative for fever.  Respiratory:  Negative for cough, shortness of breath and wheezing.   Cardiovascular:  Negative for chest pain, palpitations and leg swelling.  Gastrointestinal:  Negative for blood in stool and nausea.       No gerd/reflux  Musculoskeletal:  Positive for back pain (lower back - chronic).       Joint pain - knees ache if sitting too long  Neurological:  Positive for headaches (occ).  Negative for light-headedness.       Objective:   Vitals:   02/18/24 1303  BP: 124/70  Pulse: 79  Temp: 98 F (36.7 C)  SpO2: 100%   BP Readings from Last 3 Encounters:  02/18/24 124/70  11/26/23 114/71  11/16/23 123/74   Wt Readings from Last 3 Encounters:  02/18/24 215 lb (97.5 kg)  11/16/23 225 lb (102.1 kg)  08/23/23 226 lb 6.6 oz (102.7 kg)   Body mass index is 36.9 kg/m.    Physical Exam Constitutional:      General: She is not in acute distress.    Appearance: Normal appearance.  HENT:     Head: Normocephalic and atraumatic.  Eyes:     Conjunctiva/sclera: Conjunctivae normal.  Cardiovascular:     Rate and Rhythm: Normal rate and regular rhythm.     Heart sounds: Normal heart sounds.  Pulmonary:     Effort: Pulmonary effort is normal. No respiratory distress.     Breath sounds: Normal breath sounds. No wheezing.  Musculoskeletal:     Cervical back: Neck supple.     Right lower leg: No edema.     Left lower leg: No edema.  Lymphadenopathy:     Cervical: No cervical adenopathy.  Skin:    General: Skin is warm and dry.     Findings: No rash.  Neurological:  Mental Status: She is alert. Mental status is at baseline.  Psychiatric:        Mood and Affect: Mood normal.        Behavior: Behavior normal.        Lab Results  Component Value Date   WBC 7.1 02/18/2024   HGB 11.2 (L) 02/18/2024   HCT 34.5 (L) 02/18/2024   PLT 550.0 (H) 02/18/2024   GLUCOSE 76 02/18/2024   CHOL 134 02/18/2024   TRIG 76.0 02/18/2024   HDL 40.90 02/18/2024   LDLDIRECT 173.0 01/23/2022   LDLCALC 78 02/18/2024   ALT 21 02/18/2024   AST 17 02/18/2024   NA 137 02/18/2024   K 3.5 02/18/2024   CL 96 02/18/2024   CREATININE 0.60 02/18/2024   BUN 9 02/18/2024   CO2 30 02/18/2024   TSH 0.14 (L) 02/18/2024   INR 0.9 02/15/2020   HGBA1C 5.9 02/18/2024     Assessment & Plan:    See Problem List for Assessment and Plan of chronic medical problems.

## 2024-02-18 ENCOUNTER — Other Ambulatory Visit (HOSPITAL_COMMUNITY): Payer: Self-pay

## 2024-02-18 ENCOUNTER — Ambulatory Visit: Admitting: Internal Medicine

## 2024-02-18 VITALS — BP 124/70 | HR 79 | Temp 98.0°F | Ht 64.0 in | Wt 215.0 lb

## 2024-02-18 DIAGNOSIS — I10 Essential (primary) hypertension: Secondary | ICD-10-CM | POA: Diagnosis not present

## 2024-02-18 DIAGNOSIS — R7303 Prediabetes: Secondary | ICD-10-CM | POA: Diagnosis not present

## 2024-02-18 DIAGNOSIS — G43809 Other migraine, not intractable, without status migrainosus: Secondary | ICD-10-CM

## 2024-02-18 DIAGNOSIS — E039 Hypothyroidism, unspecified: Secondary | ICD-10-CM

## 2024-02-18 DIAGNOSIS — D509 Iron deficiency anemia, unspecified: Secondary | ICD-10-CM | POA: Diagnosis not present

## 2024-02-18 DIAGNOSIS — D75839 Thrombocytosis, unspecified: Secondary | ICD-10-CM

## 2024-02-18 DIAGNOSIS — E782 Mixed hyperlipidemia: Secondary | ICD-10-CM

## 2024-02-18 DIAGNOSIS — F419 Anxiety disorder, unspecified: Secondary | ICD-10-CM

## 2024-02-18 DIAGNOSIS — F3289 Other specified depressive episodes: Secondary | ICD-10-CM | POA: Diagnosis not present

## 2024-02-18 DIAGNOSIS — R700 Elevated erythrocyte sedimentation rate: Secondary | ICD-10-CM | POA: Diagnosis not present

## 2024-02-18 LAB — CBC WITH DIFFERENTIAL/PLATELET
Basophils Absolute: 0 K/uL (ref 0.0–0.1)
Basophils Relative: 0.6 % (ref 0.0–3.0)
Eosinophils Absolute: 0.1 K/uL (ref 0.0–0.7)
Eosinophils Relative: 1 % (ref 0.0–5.0)
HCT: 34.5 % — ABNORMAL LOW (ref 36.0–46.0)
Hemoglobin: 11.2 g/dL — ABNORMAL LOW (ref 12.0–15.0)
Lymphocytes Relative: 25.9 % (ref 12.0–46.0)
Lymphs Abs: 1.8 K/uL (ref 0.7–4.0)
MCHC: 32.4 g/dL (ref 30.0–36.0)
MCV: 83.2 fl (ref 78.0–100.0)
Monocytes Absolute: 0.5 K/uL (ref 0.1–1.0)
Monocytes Relative: 7.5 % (ref 3.0–12.0)
Neutro Abs: 4.6 K/uL (ref 1.4–7.7)
Neutrophils Relative %: 65 % (ref 43.0–77.0)
Platelets: 550 K/uL — ABNORMAL HIGH (ref 150.0–400.0)
RBC: 4.15 Mil/uL (ref 3.87–5.11)
RDW: 16.2 % — ABNORMAL HIGH (ref 11.5–15.5)
WBC: 7.1 K/uL (ref 4.0–10.5)

## 2024-02-18 LAB — LIPID PANEL
Cholesterol: 134 mg/dL (ref 0–200)
HDL: 40.9 mg/dL (ref >39.00)
LDL Cholesterol: 78 mg/dL (ref 0–99)
NonHDL: 93.11
Total CHOL/HDL Ratio: 3
Triglycerides: 76 mg/dL (ref 0.0–149.0)
VLDL: 15.2 mg/dL (ref 0.0–40.0)

## 2024-02-18 LAB — COMPREHENSIVE METABOLIC PANEL WITH GFR
ALT: 21 U/L (ref 0–35)
AST: 17 U/L (ref 0–37)
Albumin: 3.8 g/dL (ref 3.5–5.2)
Alkaline Phosphatase: 94 U/L (ref 39–117)
BUN: 9 mg/dL (ref 6–23)
CO2: 30 meq/L (ref 19–32)
Calcium: 9.4 mg/dL (ref 8.4–10.5)
Chloride: 96 meq/L (ref 96–112)
Creatinine, Ser: 0.6 mg/dL (ref 0.40–1.20)
GFR: 115.13 mL/min (ref >60.00)
Glucose, Bld: 76 mg/dL (ref 70–99)
Potassium: 3.5 meq/L (ref 3.5–5.1)
Sodium: 137 meq/L (ref 135–145)
Total Bilirubin: 0.5 mg/dL (ref 0.2–1.2)
Total Protein: 8.4 g/dL — ABNORMAL HIGH (ref 6.0–8.3)

## 2024-02-18 LAB — SEDIMENTATION RATE: Sed Rate: 77 mm/h — ABNORMAL HIGH (ref 0–20)

## 2024-02-18 LAB — C-REACTIVE PROTEIN: CRP: 11.9 mg/dL (ref 0.5–20.0)

## 2024-02-18 LAB — HEMOGLOBIN A1C: Hgb A1c MFr Bld: 5.9 % (ref 4.6–6.5)

## 2024-02-18 LAB — TSH: TSH: 0.14 u[IU]/mL — ABNORMAL LOW (ref 0.35–5.50)

## 2024-02-18 LAB — VITAMIN B12: Vitamin B-12: 450 pg/mL (ref 211–911)

## 2024-02-18 MED ORDER — VENLAFAXINE HCL ER 75 MG PO CP24
75.0000 mg | ORAL_CAPSULE | Freq: Every day | ORAL | 2 refills | Status: AC
  Filled 2024-02-18: qty 90, 90d supply, fill #0
  Filled 2024-05-30 – 2024-05-31 (×2): qty 90, 90d supply, fill #1

## 2024-02-18 NOTE — Assessment & Plan Note (Signed)
 Chronic BP well controlled Cmp, cbc Continue hyzaar 50-12.5 mg daily

## 2024-02-18 NOTE — Assessment & Plan Note (Signed)
 Chronic Following with hematology

## 2024-02-18 NOTE — Progress Notes (Unsigned)
  [facility] [address], [place] [address]   CLINIC:  Medical Oncology/Hematology  PCP:  Geofm Glade PARAS, MD [address] [place] [address] [phone]   REASON FOR VISIT:  Follow-up for thrombocytosis + iron  deficiency anemia  CURRENT THERAPY: Surveillance + daily iron  tablet with intermittent IV iron   INTERVAL HISTORY:   [name] 38 y.o. female returns for routine follow-up of thrombocytosis and iron  deficiency anemia. She was last seen by Pleasant Barefoot PA-C on 11/16/2023.  At today's visit, she reports feeling fair. No recent hospitalizations, surgeries, or changes in baseline health status. She received Venofer  400 mg x 1 on 11/26/2023, and reports tolerating this well and feeling improved energy after infusion. She does not some recurrent fatigue over the past few weeks. She reports mild ice pica. She has been taking iron  tablet daily, which she is tolerating well apart from some mild constipation. She reports irregular light menstrual periods due to IUD in place. She denies any heavy menstrual bleeding.  No history of DVT or PE.  No aquagenic pruritus, erythromelalgia, or vasomotor symptoms.  She denies any B symptoms, abdominal pain, nausea, or early satiety.  No history of inflammatory or autoimmune conditions.  She has 25% energy and 100% appetite. She endorses that she is maintaining a stable weight.  ASSESSMENT & PLAN:  1.  Thrombocytosis: - Seen at the request of Dr. Glade Geofm (PCP). - Elevated platelet count since August 2021 (ranging from 400-560).  WBC and Hb in the normal range. - She has history of migraines. - She does not use tobacco. - No history of DVT or PE. - No history of autoimmune or inflammatory conditions. - No aquagenic pruritus, erythromelalgia, or vasomotor symptoms. - MPN workup NEGATIVE: Negative JAK2 V617F, negative CALR, MPL, Exons 12-15.  Negative BCR/ABL. - ANA and rheumatoid factor negative.   Inflammatory markers (ESR and CRP) have been elevated - Borderline iron  deficiency noted (07/15/2023) with ferritin 43, 16% saturation, Hgb 11.8. - Taking iron  tablet daily since March 2025.  Received Venofer  400 mg x 1 in June 2025. - Most recent labs (02/15/2024): Hgb 11.0/MCV 87.3, platelets 540.  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   Platelets may be elevated in reaction to obesity (increased IL-6 secretion by adipocytes), iron  deficiency, prediabetes (associated with increased inflammation, elevated fibrinogen levels, insulin resistance, and subclinical inflammation) - PLAN: Platelets did not improve after correction of iron  levels. - Continue surveillance, but consider bone marrow biopsy if there is significant worsening in platelet count (>600). (MD Discussion w/ Dr. Davonna on 02/22/24 - agrees with ongoing surveillance.) - Labs and RTC in 6 months   2.  Iron  deficiency anemia - Labs from 11/12/2023 show Hgb 10.7 with ferritin 71 and iron  saturation 10% - Denies rectal bleeding or melena.  Reports irregular light menstrual cycles due to IUD. - She is taking iron  tablet daily.  Received Venofer  400 mg x 1 in June 2025. - Symptomatic with fatigue and ice pica - Most recent labs (02/15/2024): Hgb 11.0/MCV 87.3.  Ferritin 186, iron  saturation 9%. -  Stool cards not yet returned  - PLAN: Continue daily iron  supplement - can switch to every other day due to constipation - Reminded to return stool cards. - Repeat labs and RTC in 6 months.    3.  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 - No tobacco use.  - No family history of thrombocytosis. Maternal uncle had a DVT and PE. Maternal grandmother had metastatic ovarian cancer. Maternal aunt had cervical cancer. Maternal uncle had pancreatic cancer.   PLAN  SUMMARY: >> Stool cards x 3 >> Labs in 6 months = CBC/D, ferritin, iron /TIBC >> OFFICE visit in 6 months     REVIEW OF SYSTEMS:   Review of Systems  Constitutional:  Positive for fatigue. Negative for appetite change, chills, diaphoresis, fever and unexpected weight change.  HENT:   Negative for lump/mass and nosebleeds.   Eyes:  Negative for eye problems.  Respiratory:  Negative for cough, hemoptysis and shortness of breath.   Cardiovascular:  Negative for chest pain, leg swelling and palpitations.  Gastrointestinal:  Positive for constipation. Negative for abdominal pain, blood in stool, diarrhea, nausea and vomiting.  Genitourinary:  Negative for hematuria.   Skin: Negative.   Neurological:  Positive for headaches. Negative for dizziness and light-headedness.  Hematological:  Does not bruise/bleed easily.     PHYSICAL EXAM:  ECOG PERFORMANCE STATUS: 1 - Symptomatic but completely ambulatory  Vitals:   02/22/24 0901  BP: 123/84  Pulse: 87  Resp: 18  Temp: 98.1 F (36.7 C)  SpO2: 98%    Filed Weights   02/22/24 0901  Weight: 215 lb 9.8 oz (97.8 kg)    Physical Exam Constitutional:      Appearance: Normal appearance. She is obese.  Cardiovascular:     Heart sounds: Normal heart sounds.  Pulmonary:     Breath sounds: Normal breath sounds.  Neurological:     General: No focal deficit present.     Mental Status: Mental status is at baseline.  Psychiatric:        Behavior: Behavior normal. Behavior is cooperative.    PAST MEDICAL/SURGICAL HISTORY:  Past Medical History:  Diagnosis Date   Depression    Post partum took lexapro  and wellbutrin  and it helped   Hypothyroid 2012 dx   post partum hyperthyroid   Migraine headache    Past Surgical History:  Procedure Laterality Date   CHOLECYSTECTOMY N/A 07/18/2019   Procedure: LAPAROSCOPIC CHOLECYSTECTOMY;  Surgeon: Mavis Anes, MD;  Location: [facility];  Service: General;  Laterality: N/A;   MOUTH SURGERY      Removal (R) Fallopian tube  09/2009    SOCIAL HISTORY:  Social History   Socioeconomic History   Marital status: Married    Spouse name: Not on file   Number of children: Not on file   Years of education: Not on file   Highest education level: Associate degree: academic program  Occupational History   Not on file  Tobacco Use   Smoking status: Never   Smokeless tobacco: Never  Substance and Sexual Activity   Alcohol use: No   Drug use: No   Sexual activity: Yes    Partners: Male    Comment: spouse  Other Topics Concern   Not on file  Social History Narrative   Not on file   Social Drivers of Health   Financial Resource Strain: Low Risk  (02/18/2024)   Overall Financial Resource Strain (CARDIA)    Difficulty of Paying Living Expenses: Not hard at all  Food Insecurity: No Food Insecurity (02/18/2024)   Hunger Vital Sign    Worried About Running Out of Food in the Last Year: Never true    Ran Out of Food in the Last Year: Never true  Transportation Needs:  No Transportation Needs (02/18/2024)   PRAPARE - Administrator, Civil Service (Medical): No    Lack of Transportation (Non-Medical): No  Physical Activity: Sufficiently Active (02/18/2024)   Exercise Vital Sign    Days of Exercise per Week: 3 days    Minutes of Exercise per Session: 60 min  Stress: No Stress Concern Present (02/18/2024)   [employer] of Occupational Health - Occupational Stress Questionnaire    Feeling of Stress: Only a little  Social Connections: Moderately Isolated (02/18/2024)   Social Connection and Isolation Panel    Frequency of Communication with Friends and Family: More than three times a week    Frequency of Social Gatherings with Friends and Family: Once a week    Attends Religious Services: Never    Database administrator or Organizations: No    Attends Engineer, structural: Not on file    Marital Status: Married  Catering manager Violence: Not At Risk (07/15/2023)    Humiliation, Afraid, Rape, and Kick questionnaire    Fear of Current or Ex-Partner: No    Emotionally Abused: No    Physically Abused: No    Sexually Abused: No    FAMILY HISTORY:  Family History  Problem Relation Age of Onset   Alcohol abuse Maternal Uncle    Pancreatic cancer Maternal Grandmother    Stroke Maternal Grandfather    Hypertension Mother    Diabetes Mother    Hyperlipidemia Mother    Thyroid  disease Neg Hx     CURRENT MEDICATIONS:  Outpatient Encounter Medications as of 02/22/2024  Medication Sig   butalbital -acetaminophen -caffeine  (FIORICET ) 50-325-40 MG tablet Take 1 tablet by mouth every 4 (four) hours as needed for headache.   ferrous sulfate 325 (65 FE) MG tablet Take 325 mg by mouth daily with breakfast.   levonorgestrel  (MIRENA , 52 MG,) 20 MCG/DAY IUD Mirena  21 mcg/24 hours (8 yrs) 52 mg intrauterine device  Take 1 device every day by intrauterine route.   levothyroxine  (SYNTHROID ) 100 MCG tablet Take 1 tablet (100 mcg total) by mouth daily 6 days a week and take 1/2 tablet by mouth 1 day a week   losartan -hydrochlorothiazide  (HYZAAR ) 50-12.5 MG tablet Take 1 tablet by mouth daily.   Misc Natural Products (PUMPKIN SEED OIL) CAPS Take 3 capsules by mouth daily.   venlafaxine  XR (EFFEXOR -XR) 75 MG 24 hr capsule Take 1 capsule (75 mg total) by mouth daily with breakfast.   Vitamin D, Ergocalciferol, (DRISDOL) 1.25 MG (50000 UNIT) CAPS capsule Take 50,000 Units by mouth every 7 (seven) days.   [DISCONTINUED] levothyroxine  (SYNTHROID ) 100 MCG tablet Take 1 tablet (100 mcg total) by mouth daily.   No facility-administered encounter medications on file as of 02/22/2024.    ALLERGIES:  Allergies  Allergen Reactions   Gabapentin      Made her feel funny    LABORATORY DATA:  I have reviewed the labs as listed.  CBC    Component Value Date/Time   WBC 7.1 02/18/2024 1331   RBC 4.15 02/18/2024 1331   HGB 11.2 (L) 02/18/2024 1331   HCT 34.5 (L) 02/18/2024 1331    PLT 550.0 (H) 02/18/2024 1331   MCV 83.2 02/18/2024 1331   MCH 27.3 02/15/2024 1028   MCHC 32.4 02/18/2024 1331   RDW 16.2 (H) 02/18/2024 1331   LYMPHSABS 1.8 02/18/2024 1331   MONOABS 0.5 02/18/2024 1331   EOSABS 0.1 02/18/2024 1331   BASOSABS 0.0 02/18/2024 1331      Latest Ref Rng &  Units 02/18/2024    1:31 PM 07/09/2023    1:28 PM 01/23/2022    1:33 PM  CMP  Glucose 70 - 99 mg/dL 76  80  890   BUN 6 - 23 mg/dL 9  8  10    Creatinine 0.40 - 1.20 mg/dL 9.39  9.40  9.29   Sodium 135 - 145 mEq/L 137  135  136   Potassium 3.5 - 5.1 mEq/L 3.5  3.8  3.5   Chloride 96 - 112 mEq/L 96  98  99   CO2 19 - 32 mEq/L 30  30  31    Calcium 8.4 - 10.5 mg/dL 9.4  9.5  9.6   Total Protein 6.0 - 8.3 g/dL 8.4  8.2  7.9   Total Bilirubin 0.2 - 1.2 mg/dL 0.5  0.5  0.4   Alkaline Phos 39 - 117 U/L 94  73  57   AST 0 - 37 U/L 17  20  30    ALT 0 - 35 U/L 21  21  37     DIAGNOSTIC IMAGING:  I have independently reviewed the relevant imaging and discussed with the patient.   WRAP UP:  All questions were answered. The patient knows to call the clinic with any problems, questions or concerns.  Medical decision making: Moderate  Time spent on visit: I spent 20 minutes counseling the patient face to face. The total time spent in the appointment was 30 minutes and more than 50% was on counseling.  Pleasant CHRISTELLA Barefoot, PA-C  02/22/24 12:00 PM

## 2024-02-18 NOTE — Assessment & Plan Note (Signed)
Chronic Regular exercise and healthy diet encouraged Check lipid panel  Continue lifestyle control 

## 2024-02-18 NOTE — Assessment & Plan Note (Signed)
 Subacute Has had elevated ESR, CRP-unsure why Will recheck levels today

## 2024-02-18 NOTE — Assessment & Plan Note (Signed)
Chronic  Clinically euthyroid Check tsh and will titrate med dose if needed Currently taking levothyroxine 100 mcg daily

## 2024-02-18 NOTE — Assessment & Plan Note (Signed)
 Chronic Continue to work on weight loss Encouraged regular exercise She is trying to eat better-continue to work on diet

## 2024-02-18 NOTE — Assessment & Plan Note (Signed)
 Chronic Controlled, Stable Continue venlafaxine 75 mg daily No longer seeing Dr. Dellia Cloud

## 2024-02-18 NOTE — Assessment & Plan Note (Signed)
Chronic Controlled, Stable Continue venlafaxine 75 mg daily 

## 2024-02-18 NOTE — Assessment & Plan Note (Signed)
 Chronic Lab Results  Component Value Date   HGBA1C 5.9 02/18/2024   Check a1c Low sugar / carb diet Stressed regular exercise

## 2024-02-18 NOTE — Assessment & Plan Note (Addendum)
 Chronic Following with hematology Has had an iron  infusion Recent blood work shows low iron  Denies heavy menstrual cycle, not a vegetarian/vegan Taking oral iron  Mild anemia with last blood work Recheck CBC, B12 rule out other causes of anemia Has follow-up with hematology next week

## 2024-02-18 NOTE — Assessment & Plan Note (Signed)
Chronic Intermittent headaches Continue Fioricet 50-325 mg every 4 hours as needed

## 2024-02-18 NOTE — Patient Instructions (Addendum)
      Blood work was ordered.       Medications changes include :   None     Return in about 6 months (around 08/19/2024) for Physical Exam.

## 2024-02-20 ENCOUNTER — Ambulatory Visit: Payer: Self-pay | Admitting: Internal Medicine

## 2024-02-20 MED ORDER — LEVOTHYROXINE SODIUM 100 MCG PO TABS: ORAL_TABLET | ORAL | Status: DC

## 2024-02-22 ENCOUNTER — Inpatient Hospital Stay: Attending: Physician Assistant | Admitting: Physician Assistant

## 2024-02-22 DIAGNOSIS — D75839 Thrombocytosis, unspecified: Secondary | ICD-10-CM | POA: Insufficient documentation

## 2024-02-22 DIAGNOSIS — D509 Iron deficiency anemia, unspecified: Secondary | ICD-10-CM | POA: Insufficient documentation

## 2024-02-22 DIAGNOSIS — Z793 Long term (current) use of hormonal contraceptives: Secondary | ICD-10-CM | POA: Insufficient documentation

## 2024-02-22 NOTE — Patient Instructions (Signed)
 [facility] **VISIT SUMMARY & IMPORTANT INSTRUCTIONS **   You were seen today by Pleasant Barefoot PA-C for your elevated platelets.    ELEVATED PLATELETS: Your labs did NOT show any evidence of genetic mutation or cancerous cause of high platelets.  We suspect that your elevated platelets are reactive in the setting of inflammation and other causes discussed below. Iron  deficiency: We do not know the cause of your iron  deficiency. Please complete stool cards x 3 to see if you have any blood in your bowel movements. Continue taking iron  tablet daily - can switch to every other day due to constipation. Obesity: Adipocytes (fat cells) produce pro-inflammatory chemicals.  Prediabetes: Insulin resistance and other factors seen in prediabetes have been shown to increase levels of inflammation.  FOLLOW-UP APPOINTMENT: Repeat labs and office visit in 6 months.   ** Thank you for trusting me with your healthcare!  I strive to provide all of my patients with quality care at each visit.  If you receive a survey for this visit, I would be so grateful to you for taking the time to provide feedback.  Thank you in advance!  ~ Rifky Lapre                                        Dr. Mickiel Davonna Pleasant Barefoot, PA-C       Delon Hope, NP   - - - - - - - - - - - - - - - - - -    Thank you for choosing [facility] at [facility] to provide your oncology and hematology care.  To afford each patient quality time with our provider, please arrive at least 15 minutes before your scheduled appointment time.   If you have a lab appointment with the Cancer Center please come in thru the Main Entrance and check in at the main information desk.  You need to re-schedule your appointment should you arrive 10 or more minutes late.  We strive to give you quality time with our providers, and arriving late affects you and other patients whose  appointments are after yours.  Also, if you no show three or more times for appointments you may be dismissed from the clinic at the providers discretion.     Again, thank you for choosing [facility].  Our hope is that these requests will decrease the amount of time that you wait before being seen by our physicians.       _____________________________________________________________  Should you have questions after your visit to [facility], please contact our office at [phone] and follow the prompts.  Our office hours are 8:00 a.m. and 4:30 p.m. Monday - Friday.  Please note that voicemails left after 4:00 p.m. may not be returned until the following business day.  We are closed weekends and major holidays.  You do have access to a nurse 24-7, just call the main number to the clinic [phone] and do not press any options, hold on the line and a nurse will answer the phone.    For prescription refill requests, have your pharmacy contact our office and allow 72 hours.

## 2024-02-25 ENCOUNTER — Other Ambulatory Visit: Payer: Self-pay

## 2024-03-06 ENCOUNTER — Other Ambulatory Visit (HOSPITAL_COMMUNITY): Payer: Self-pay

## 2024-03-06 ENCOUNTER — Encounter: Payer: Self-pay | Admitting: Internal Medicine

## 2024-03-06 ENCOUNTER — Other Ambulatory Visit: Payer: Self-pay

## 2024-03-06 MED ORDER — LEVOTHYROXINE SODIUM 100 MCG PO TABS
ORAL_TABLET | ORAL | 2 refills | Status: AC
  Filled 2024-03-06: qty 90, 90d supply, fill #0
  Filled 2024-06-12: qty 90, 90d supply, fill #1

## 2024-03-29 ENCOUNTER — Encounter: Payer: Self-pay | Admitting: Physician Assistant

## 2024-03-29 ENCOUNTER — Other Ambulatory Visit: Payer: Self-pay | Admitting: *Deleted

## 2024-03-29 DIAGNOSIS — D75839 Thrombocytosis, unspecified: Secondary | ICD-10-CM

## 2024-03-29 DIAGNOSIS — D509 Iron deficiency anemia, unspecified: Secondary | ICD-10-CM

## 2024-03-29 NOTE — Telephone Encounter (Signed)
**Note De-identified  [place] Obfuscation** Please advise 

## 2024-03-29 NOTE — Telephone Encounter (Signed)
Patient aware and will have labs drawn tomorrow

## 2024-03-30 ENCOUNTER — Inpatient Hospital Stay: Attending: Physician Assistant

## 2024-03-30 DIAGNOSIS — D509 Iron deficiency anemia, unspecified: Secondary | ICD-10-CM | POA: Diagnosis present

## 2024-03-30 LAB — CBC
HCT: 34.5 % — ABNORMAL LOW (ref 36.0–46.0)
Hemoglobin: 10.7 g/dL — ABNORMAL LOW (ref 12.0–15.0)
MCH: 27.2 pg (ref 26.0–34.0)
MCHC: 31 g/dL (ref 30.0–36.0)
MCV: 87.6 fL (ref 80.0–100.0)
Platelets: 518 K/uL — ABNORMAL HIGH (ref 150–400)
RBC: 3.94 MIL/uL (ref 3.87–5.11)
RDW: 15 % (ref 11.5–15.5)
WBC: 7.5 K/uL (ref 4.0–10.5)
nRBC: 0 % (ref 0.0–0.2)

## 2024-03-30 LAB — IRON AND TIBC
Iron: 30 ug/dL (ref 28–170)
Saturation Ratios: 11 % (ref 10.4–31.8)
TIBC: 274 ug/dL (ref 250–450)
UIBC: 245 ug/dL

## 2024-03-30 LAB — FERRITIN: Ferritin: 435 ng/mL — ABNORMAL HIGH (ref 11–307)

## 2024-05-11 ENCOUNTER — Other Ambulatory Visit

## 2024-05-11 ENCOUNTER — Inpatient Hospital Stay: Attending: Physician Assistant

## 2024-05-11 DIAGNOSIS — D509 Iron deficiency anemia, unspecified: Secondary | ICD-10-CM | POA: Insufficient documentation

## 2024-05-11 DIAGNOSIS — D75839 Thrombocytosis, unspecified: Secondary | ICD-10-CM

## 2024-05-11 LAB — CBC WITH DIFFERENTIAL/PLATELET
Abs Immature Granulocytes: 0.02 K/uL (ref 0.00–0.07)
Basophils Absolute: 0 K/uL (ref 0.0–0.1)
Basophils Relative: 1 %
Eosinophils Absolute: 0.1 K/uL (ref 0.0–0.5)
Eosinophils Relative: 1 %
HCT: 33.9 % — ABNORMAL LOW (ref 36.0–46.0)
Hemoglobin: 10.6 g/dL — ABNORMAL LOW (ref 12.0–15.0)
Immature Granulocytes: 0 %
Lymphocytes Relative: 24 %
Lymphs Abs: 1.7 K/uL (ref 0.7–4.0)
MCH: 27.5 pg (ref 26.0–34.0)
MCHC: 31.3 g/dL (ref 30.0–36.0)
MCV: 88.1 fL (ref 80.0–100.0)
Monocytes Absolute: 0.4 K/uL (ref 0.1–1.0)
Monocytes Relative: 5 %
Neutro Abs: 4.9 K/uL (ref 1.7–7.7)
Neutrophils Relative %: 69 %
Platelets: 590 K/uL — ABNORMAL HIGH (ref 150–400)
RBC: 3.85 MIL/uL — ABNORMAL LOW (ref 3.87–5.11)
RDW: 15.9 % — ABNORMAL HIGH (ref 11.5–15.5)
WBC: 7.1 K/uL (ref 4.0–10.5)
nRBC: 0 % (ref 0.0–0.2)

## 2024-05-11 LAB — IRON AND TIBC
Iron: 31 ug/dL (ref 28–170)
Saturation Ratios: 11 % (ref 10.4–31.8)
TIBC: 286 ug/dL (ref 250–450)
UIBC: 254 ug/dL

## 2024-05-11 LAB — FERRITIN: Ferritin: 437 ng/mL — ABNORMAL HIGH (ref 11–307)

## 2024-05-11 LAB — SEDIMENTATION RATE: Sed Rate: 134 mm/h — ABNORMAL HIGH (ref 0–20)

## 2024-05-11 LAB — C-REACTIVE PROTEIN: CRP: 16 mg/dL — ABNORMAL HIGH (ref <1.0)

## 2024-05-12 ENCOUNTER — Other Ambulatory Visit (HOSPITAL_COMMUNITY): Payer: Self-pay

## 2024-05-12 DIAGNOSIS — D509 Iron deficiency anemia, unspecified: Secondary | ICD-10-CM

## 2024-05-12 DIAGNOSIS — D75839 Thrombocytosis, unspecified: Secondary | ICD-10-CM

## 2024-05-12 LAB — OCCULT BLOOD X 1 CARD TO LAB, STOOL
Fecal Occult Bld: NEGATIVE
Fecal Occult Bld: NEGATIVE
Fecal Occult Bld: NEGATIVE

## 2024-05-12 LAB — SOLUBLE TRANSFERRIN RECEPTOR: Transferrin Receptor: 15.8 nmol/L (ref 12.2–27.3)

## 2024-05-15 ENCOUNTER — Other Ambulatory Visit (HOSPITAL_COMMUNITY): Payer: Self-pay

## 2024-05-15 ENCOUNTER — Other Ambulatory Visit: Payer: Self-pay | Admitting: Internal Medicine

## 2024-05-15 ENCOUNTER — Other Ambulatory Visit: Payer: Self-pay

## 2024-05-15 MED ORDER — LOSARTAN POTASSIUM-HCTZ 50-12.5 MG PO TABS
1.0000 | ORAL_TABLET | Freq: Every day | ORAL | 0 refills | Status: AC
  Filled 2024-05-15: qty 90, 90d supply, fill #0

## 2024-05-16 ENCOUNTER — Ambulatory Visit: Payer: Self-pay | Admitting: Physician Assistant

## 2024-05-17 NOTE — Telephone Encounter (Signed)
 See response

## 2024-05-30 ENCOUNTER — Other Ambulatory Visit (HOSPITAL_COMMUNITY): Payer: Self-pay

## 2024-05-31 ENCOUNTER — Other Ambulatory Visit: Payer: Self-pay

## 2024-05-31 ENCOUNTER — Other Ambulatory Visit (HOSPITAL_COMMUNITY): Payer: Self-pay

## 2024-06-10 ENCOUNTER — Other Ambulatory Visit (HOSPITAL_COMMUNITY): Payer: Self-pay

## 2024-06-12 ENCOUNTER — Other Ambulatory Visit: Payer: Self-pay

## 2024-06-12 ENCOUNTER — Other Ambulatory Visit (HOSPITAL_COMMUNITY): Payer: Self-pay

## 2024-06-13 ENCOUNTER — Other Ambulatory Visit (HOSPITAL_COMMUNITY): Payer: Self-pay

## 2024-06-13 ENCOUNTER — Encounter: Payer: Self-pay | Admitting: Internal Medicine

## 2024-06-13 DIAGNOSIS — E039 Hypothyroidism, unspecified: Secondary | ICD-10-CM

## 2024-06-14 ENCOUNTER — Other Ambulatory Visit (HOSPITAL_COMMUNITY): Payer: Self-pay

## 2024-06-16 ENCOUNTER — Other Ambulatory Visit (HOSPITAL_COMMUNITY): Payer: Self-pay

## 2024-06-16 ENCOUNTER — Telehealth: Payer: Self-pay

## 2024-06-16 ENCOUNTER — Other Ambulatory Visit: Payer: Self-pay

## 2024-06-16 MED ORDER — LEVOTHYROXINE SODIUM 100 MCG PO TABS
100.0000 ug | ORAL_TABLET | Freq: Every day | ORAL | 3 refills | Status: AC
  Filled 2024-06-16: qty 90, 90d supply, fill #0

## 2024-06-16 NOTE — Telephone Encounter (Signed)
 Copied from CRM #[id number]. Topic: Clinical - Prescription Issue >> Jun 16, 2024  2:04 PM Alfonso HERO wrote: Reason for CRM: Pharmacy called and wants to get approval to change brand of levothyroxine  (SYNTHROID ) 100 MCG tablet to [facility] because they no longer carry the original brand patient has been taking.

## 2024-06-16 NOTE — Telephone Encounter (Signed)
 Please advise as Md is out of office

## 2024-06-16 NOTE — Telephone Encounter (Signed)
 I have sent in I believe I may have this done correctly

## 2024-06-19 ENCOUNTER — Other Ambulatory Visit (HOSPITAL_COMMUNITY): Payer: Self-pay

## 2024-06-19 ENCOUNTER — Encounter: Payer: Self-pay | Admitting: *Deleted

## 2024-06-19 NOTE — Telephone Encounter (Signed)
 Spoke with [place] today from the pharmacy.

## 2024-07-18 ENCOUNTER — Other Ambulatory Visit (HOSPITAL_COMMUNITY): Payer: Self-pay

## 2024-08-21 ENCOUNTER — Inpatient Hospital Stay: Attending: Physician Assistant

## 2024-08-28 ENCOUNTER — Inpatient Hospital Stay: Admitting: Physician Assistant

## 2024-10-13 ENCOUNTER — Encounter: Admitting: Internal Medicine
